# Patient Record
Sex: Male | Born: 1935 | Race: White | Hispanic: No | Marital: Married | Smoking: Never smoker
Health system: Southern US, Community
[De-identification: ages and names within clinical notes are randomized; demographics above are authoritative.]

## PROBLEM LIST (undated history)

## (undated) DIAGNOSIS — M1990 Unspecified osteoarthritis, unspecified site: Secondary | ICD-10-CM

## (undated) DIAGNOSIS — I10 Essential (primary) hypertension: Secondary | ICD-10-CM

## (undated) DIAGNOSIS — Z860100 Personal history of colon polyps, unspecified: Secondary | ICD-10-CM

## (undated) DIAGNOSIS — H3530 Unspecified macular degeneration: Secondary | ICD-10-CM

## (undated) DIAGNOSIS — N529 Male erectile dysfunction, unspecified: Secondary | ICD-10-CM

## (undated) DIAGNOSIS — IMO0002 Reserved for concepts with insufficient information to code with codable children: Secondary | ICD-10-CM

## (undated) DIAGNOSIS — F1010 Alcohol abuse, uncomplicated: Secondary | ICD-10-CM

## (undated) DIAGNOSIS — L111 Transient acantholytic dermatosis [Grover]: Secondary | ICD-10-CM

## (undated) DIAGNOSIS — Z8719 Personal history of other diseases of the digestive system: Secondary | ICD-10-CM

## (undated) HISTORY — DX: Reserved for concepts with insufficient information to code with codable children: IMO0002

## (undated) HISTORY — DX: Personal history of other diseases of the digestive system: Z87.19

## (undated) HISTORY — DX: Unspecified macular degeneration: H35.30

## (undated) HISTORY — DX: Essential (primary) hypertension: I10

## (undated) HISTORY — DX: Alcohol abuse, uncomplicated: F10.10

## (undated) HISTORY — DX: Personal history of colonic polyps: Z86.010

## (undated) HISTORY — DX: Male erectile dysfunction, unspecified: N52.9

## (undated) HISTORY — DX: Unspecified osteoarthritis, unspecified site: M19.90

## (undated) HISTORY — PX: BACK SURGERY: SHX140

## (undated) HISTORY — DX: Personal history of colon polyps, unspecified: Z86.0100

---

## 1969-04-17 HISTORY — PX: SPINE SURGERY: SHX786

## 1985-03-15 ENCOUNTER — Encounter: Payer: Self-pay | Admitting: Family Medicine

## 1997-09-15 ENCOUNTER — Emergency Department (HOSPITAL_COMMUNITY): Admission: EM | Admit: 1997-09-15 | Discharge: 1997-09-15 | Payer: Self-pay | Admitting: Emergency Medicine

## 1997-09-21 ENCOUNTER — Emergency Department (HOSPITAL_COMMUNITY): Admission: EM | Admit: 1997-09-21 | Discharge: 1997-09-21 | Payer: Self-pay | Admitting: Emergency Medicine

## 2005-02-15 ENCOUNTER — Encounter: Payer: Self-pay | Admitting: Family Medicine

## 2008-02-13 ENCOUNTER — Ambulatory Visit: Payer: Self-pay | Admitting: Internal Medicine

## 2008-02-25 ENCOUNTER — Encounter: Payer: Self-pay | Admitting: Family Medicine

## 2008-02-27 ENCOUNTER — Ambulatory Visit: Payer: Self-pay | Admitting: Internal Medicine

## 2008-05-14 ENCOUNTER — Encounter: Payer: Self-pay | Admitting: Family Medicine

## 2008-05-28 ENCOUNTER — Encounter: Payer: Self-pay | Admitting: Family Medicine

## 2008-07-02 ENCOUNTER — Emergency Department (HOSPITAL_COMMUNITY): Admission: EM | Admit: 2008-07-02 | Discharge: 2008-07-02 | Payer: Self-pay | Admitting: Emergency Medicine

## 2008-10-27 ENCOUNTER — Ambulatory Visit: Payer: Self-pay | Admitting: Family Medicine

## 2008-10-27 DIAGNOSIS — Z860100 Personal history of colon polyps, unspecified: Secondary | ICD-10-CM | POA: Insufficient documentation

## 2008-10-27 DIAGNOSIS — I10 Essential (primary) hypertension: Secondary | ICD-10-CM | POA: Insufficient documentation

## 2008-10-27 DIAGNOSIS — M810 Age-related osteoporosis without current pathological fracture: Secondary | ICD-10-CM | POA: Insufficient documentation

## 2008-10-27 DIAGNOSIS — M159 Polyosteoarthritis, unspecified: Secondary | ICD-10-CM | POA: Insufficient documentation

## 2008-10-27 DIAGNOSIS — Z8719 Personal history of other diseases of the digestive system: Secondary | ICD-10-CM | POA: Insufficient documentation

## 2008-10-30 LAB — CONVERTED CEMR LAB
ALT: 17 units/L (ref 0–53)
Albumin: 4.1 g/dL (ref 3.5–5.2)
Basophils Absolute: 0 10*3/uL (ref 0.0–0.1)
Bilirubin, Direct: 0.2 mg/dL (ref 0.0–0.3)
Cholesterol: 198 mg/dL (ref 0–200)
Creatinine, Ser: 1 mg/dL (ref 0.4–1.5)
HCT: 40.9 % (ref 39.0–52.0)
HDL: 112.4 mg/dL (ref >39.00)
LDL Cholesterol: 75 mg/dL (ref 0–99)
Lymphs Abs: 1.1 10*3/uL (ref 0.7–4.0)
MCHC: 35.4 g/dL (ref 30.0–36.0)
MCV: 100.9 fL — ABNORMAL HIGH (ref 78.0–100.0)
Monocytes Relative: 9.9 % (ref 3.0–12.0)
Neutrophils Relative %: 67.1 % (ref 43.0–77.0)
Potassium: 4.9 meq/L (ref 3.5–5.1)
TSH: 1.07 microintl units/mL (ref 0.35–5.50)
Total Bilirubin: 1 mg/dL (ref 0.3–1.2)
Total CHOL/HDL Ratio: 2
Triglycerides: 54 mg/dL (ref 0.0–149.0)
VLDL: 10.8 mg/dL (ref 0.0–40.0)

## 2008-11-13 ENCOUNTER — Ambulatory Visit: Payer: Self-pay | Admitting: Family Medicine

## 2008-11-13 DIAGNOSIS — T887XXA Unspecified adverse effect of drug or medicament, initial encounter: Secondary | ICD-10-CM

## 2008-11-17 LAB — CONVERTED CEMR LAB
Calcium: 9.5 mg/dL (ref 8.4–10.5)
Chloride: 98 meq/L (ref 96–112)
Creatinine, Ser: 1 mg/dL (ref 0.4–1.5)
Sodium: 133 meq/L — ABNORMAL LOW (ref 135–145)

## 2008-12-14 ENCOUNTER — Ambulatory Visit: Payer: Self-pay | Admitting: Family Medicine

## 2009-02-16 ENCOUNTER — Ambulatory Visit: Payer: Self-pay | Admitting: Family Medicine

## 2009-02-16 DIAGNOSIS — L570 Actinic keratosis: Secondary | ICD-10-CM

## 2009-02-16 DIAGNOSIS — F528 Other sexual dysfunction not due to a substance or known physiological condition: Secondary | ICD-10-CM | POA: Insufficient documentation

## 2009-04-13 ENCOUNTER — Ambulatory Visit: Payer: Self-pay | Admitting: Family Medicine

## 2009-06-21 ENCOUNTER — Ambulatory Visit: Payer: Self-pay | Admitting: Family Medicine

## 2009-06-22 LAB — CONVERTED CEMR LAB
ALT: 15 units/L (ref 0–53)
AST: 20 units/L (ref 0–37)
Albumin: 4.1 g/dL (ref 3.5–5.2)
BUN: 8 mg/dL (ref 6–23)
Calcium: 9 mg/dL (ref 8.4–10.5)
Creatinine, Ser: 1 mg/dL (ref 0.4–1.5)
GFR calc non Af Amer: 77.53 mL/min (ref >60)
Phosphorus: 3.4 mg/dL (ref 2.3–4.6)
Potassium: 4.2 meq/L (ref 3.5–5.1)

## 2009-06-24 ENCOUNTER — Ambulatory Visit: Payer: Self-pay | Admitting: Family Medicine

## 2009-06-24 ENCOUNTER — Encounter: Admission: RE | Admit: 2009-06-24 | Discharge: 2009-06-24 | Payer: Self-pay | Admitting: Family Medicine

## 2009-06-24 DIAGNOSIS — R222 Localized swelling, mass and lump, trunk: Secondary | ICD-10-CM | POA: Insufficient documentation

## 2009-06-25 ENCOUNTER — Telehealth: Payer: Self-pay | Admitting: Family Medicine

## 2009-07-05 ENCOUNTER — Ambulatory Visit: Payer: Self-pay | Admitting: Cardiology

## 2009-07-09 ENCOUNTER — Ambulatory Visit: Payer: Self-pay | Admitting: Family Medicine

## 2009-07-21 ENCOUNTER — Encounter: Payer: Self-pay | Admitting: Family Medicine

## 2009-08-18 ENCOUNTER — Encounter: Payer: Self-pay | Admitting: Family Medicine

## 2009-09-09 ENCOUNTER — Ambulatory Visit: Payer: Self-pay | Admitting: Family Medicine

## 2009-12-08 ENCOUNTER — Ambulatory Visit: Payer: Self-pay | Admitting: Family Medicine

## 2009-12-08 DIAGNOSIS — H6120 Impacted cerumen, unspecified ear: Secondary | ICD-10-CM | POA: Insufficient documentation

## 2010-01-05 ENCOUNTER — Ambulatory Visit: Payer: Self-pay | Admitting: Family Medicine

## 2010-02-25 ENCOUNTER — Ambulatory Visit: Payer: Self-pay | Admitting: Family Medicine

## 2010-02-27 LAB — CONVERTED CEMR LAB
ALT: 23 units/L (ref 0–53)
AST: 26 units/L (ref 0–37)
Albumin: 4.2 g/dL (ref 3.5–5.2)
Alkaline Phosphatase: 49 units/L (ref 39–117)
Basophils Absolute: 0 10*3/uL (ref 0.0–0.1)
Basophils Relative: 0.9 % (ref 0.0–3.0)
CO2: 29 meq/L (ref 19–32)
Calcium: 9.3 mg/dL (ref 8.4–10.5)
Eosinophils Relative: 2.3 % (ref 0.0–5.0)
HCT: 40.8 % (ref 39.0–52.0)
Lymphs Abs: 1.7 10*3/uL (ref 0.7–4.0)
Monocytes Relative: 10.9 % (ref 3.0–12.0)
Platelets: 247 10*3/uL (ref 150.0–400.0)
Potassium: 4.4 meq/L (ref 3.5–5.1)
RBC: 3.94 M/uL — ABNORMAL LOW (ref 4.22–5.81)
RDW: 12.7 % (ref 11.5–14.6)
TSH: 1.51 microintl units/mL (ref 0.35–5.50)
Total Protein: 7.1 g/dL (ref 6.0–8.3)
Triglycerides: 47 mg/dL (ref 0.0–149.0)

## 2010-03-23 ENCOUNTER — Ambulatory Visit: Payer: Self-pay | Admitting: Family Medicine

## 2010-03-23 DIAGNOSIS — R7309 Other abnormal glucose: Secondary | ICD-10-CM

## 2010-03-25 ENCOUNTER — Encounter: Payer: Self-pay | Admitting: Family Medicine

## 2010-03-28 ENCOUNTER — Encounter: Payer: Self-pay | Admitting: Family Medicine

## 2010-03-29 ENCOUNTER — Encounter: Payer: Self-pay | Admitting: Family Medicine

## 2010-04-12 ENCOUNTER — Encounter (INDEPENDENT_AMBULATORY_CARE_PROVIDER_SITE_OTHER): Payer: Self-pay | Admitting: *Deleted

## 2010-05-19 NOTE — Assessment & Plan Note (Signed)
Summary: ROA FOR FOLLOW-UP/JRR r/s from 11/22   Vital Signs:  Patient profile:   77 year old male Height:      67 inches Weight:      160.75 pounds BMI:     25.27 Temp:     98.1 degrees F oral Pulse rate:   84 / minute Pulse rhythm:   regular BP sitting:   124 / 62  (left arm) Cuff size:   regular  Vitals Entered By: Lewanda Rife LPN (March 23, 2010 12:45 PM) CC: follow-up visit, Abdominal Pain   History of Present Illness: here for f/u of HTN and chol and alcoholism  had a good holiday  nothing new health wise  feels good    lipids are stable with Last Lipid ProfileCholesterol: 227 (02/25/2010 8:32:59 AM)HDL:  132.50 (02/25/2010 8:32:59 AM)LDL:  75 (10/27/2008 12:05:31 PM)Triglycerides:  Last Liver profileSGOT:  26 (02/25/2010 8:32:59 AM)SPGT:  23 (02/25/2010 8:32:59 AM)T. Bili:  0.7 (02/25/2010 8:32:59 AM)Alk Phos:  49 (02/25/2010 8:32:59 AM)  does eat a fairly healthy diet  eats high fiber cereal -- with bananas  eats veg - collard greens   not much exercise -- is busy and on his feet -- walks to the mailbox  would consider 30 min walk per day     HTN in good control with current meds 124/62 today  wt is up 4 lb  sugar slt high at 111 does not have a sweet tooth as a rule -- occ oatmeal cookies 2-3 per week   no change with alcohol at all  thinks about it once in a while -- but not ready to quit yet  he drinks to soothe his nerves ?      Allergies: 1)  ! Lisinopril 2)  ! * Chorthalidone ?  Past History:  Past Medical History: Last updated: 07/09/2009 Diverticulitis, hx of Hypertension OA  Colonic polyps, hx of DDD OP- with spinal compression fx  macular degeneration/ dry alcohol abuse  at least 8-10 drinks per day ED  GI - [facility]  opthy- Dr Dione Booze   Past Surgical History: Last updated: 07/09/2009 1971 DDD surgeries times 2 colonoscopy 11/09 - tics/polyp mass overlying R med clavicle - neg CT  thoracic comp fx  Family History: Last  updated: 10/27/2008 Family History Hypertension (parent) son with diabetes  no cancer in family   Social History: Last updated: 10/27/2008 Retired Married Never Smoked Alcohol use-yes- drinks beer 7-8 per day Drug use-no Regular exercise-no  Risk Factors: Exercise: no (10/27/2008)  Risk Factors: Smoking Status: never (10/27/2008)  Review of Systems General:  Complains of fatigue; denies chills, fever, loss of appetite, and malaise. Eyes:  Denies blurring and eye irritation. CV:  Denies chest pain or discomfort, lightheadness, palpitations, shortness of breath with exertion, and swelling of feet. Resp:  Denies cough, shortness of breath, and wheezing. GI:  Denies abdominal pain, change in bowel habits, and nausea. GU:  Denies dysuria and urinary frequency. MS:  Denies muscle aches, cramps, and muscle weakness. Derm:  Denies itching, lesion(s), poor wound healing, and rash. Neuro:  Denies numbness and tingling. Psych:  Denies anxiety and depression. Endo:  Denies excessive thirst and excessive urination. Heme:  Denies abnormal bruising and bleeding.  Physical Exam  General:  Well-developed,well-nourished,in no acute distress; alert,appropriate and cooperative throughout examination Head:  normocephalic, atraumatic, and no abnormalities observed.   Eyes:  vision grossly intact, pupils equal, pupils round, and pupils reactive to light.  no conjunctival pallor, injection or icterus  Mouth:  pharynx pink and moist.   Neck:  supple with full rom and no masses or thyromegally, no JVD or carotid bruit  Chest Wall:  No deformities, masses, tenderness or gynecomastia noted. Lungs:  Normal respiratory effort, chest expands symmetrically. Lungs are clear to auscultation, no crackles or wheezes. Heart:  Normal rate and regular rhythm. S1 and S2 normal without gallop, murmur, click, rub or other extra sounds. Abdomen:  Bowel sounds positive,abdomen soft and non-tender without masses,  organomegaly or hernias noted. no renal bruits  Msk:  No deformity or scoliosis noted of thoracic or lumbar spine.  no acute joint changes  Pulses:  R and L carotid,radial,femoral,dorsalis pedis and posterior tibial pulses are full and equal bilaterally Extremities:  No clubbing, cyanosis, edema, or deformity noted with normal full range of motion of all joints.   Neurologic:  sensation intact to light touch, gait normal, and DTRs symmetrical and normal.   fine hand tremor noted  Skin:  Intact without suspicious lesions or rashes no pallor or jaundice  Cervical Nodes:  No lymphadenopathy noted Inguinal Nodes:  No significant adenopathy Psych:  is somewhat anxious today   Impression & Recommendations:  Problem # 1:  ALCOHOL ABUSE (ICD-305.00) Assessment Unchanged disc this and dangers to health pt still has little to no interest in quitting   Problem # 2:  HYPERTENSION (ICD-401.9) Assessment: Unchanged  good control  is mt stable sodium levels  no change in meds rev labs with pt  plan labs and check up in 6 months His updated medication list for this problem includes:    Doxazosin Mesylate 8 Mg Tabs (Doxazosin mesylate) ..... One tab by mouth daily    Spironolactone 25 Mg Tabs (Spironolactone) .Marland Kitchen... 1 by mouth once daily    Norvasc 10 Mg Tabs (Amlodipine besylate) .Marland Kitchen... 1 by mouth once daily  BP today: 124/62 Prior BP: 128/64 (01/05/2010)  Labs Reviewed: K+: 4.4 (02/25/2010) Creat: : 0.9 (02/25/2010)   Chol: 227 (02/25/2010)   HDL: 132.50 (02/25/2010)   LDL: 75 (10/27/2008)   TG: 47.0 (02/25/2010)  Problem # 3:  HYPERGLYCEMIA (ICD-790.29) Assessment: New mild and in need of monitoring disc imp of exercise and low sugar diet  disc role of alcohol in that  overall nutrition is fair  disc need for more veg disc need for exercise in detail  Complete Medication List: 1)  Doxazosin Mesylate 8 Mg Tabs (Doxazosin mesylate) .... One tab by mouth daily 2)  Alendronate Sodium  70 Mg Tabs (Alendronate sodium) .... One tab by mouth each week 3)  Spironolactone 25 Mg Tabs (Spironolactone) .Marland Kitchen.. 1 by mouth once daily 4)  Aspirin 81 Mg Tabs (Aspirin) .... Take 1 tablet by mouth once a day 5)  Calcium Carbonate-vitamin D 600-400 Mg-unit Tabs (Calcium carbonate-vitamin d) .... 2 tabs by mouth daily 6)  Preservision/lutein Caps (Multiple vitamins-minerals) .... Take 1 tablet by mouth once a day 7)  Centrum Silver Tabs (Multiple vitamins-minerals) .... Take 1 tablet by mouth once a day 8)  Norvasc 10 Mg Tabs (Amlodipine besylate) .Marland Kitchen.. 1 by mouth once daily   Patient Instructions: 1)  keep thinking about quitting alcohol- this would b best for your health 2)  labs ok  3)  blood pressure ok  4)  no change in medicines  5)  schedule f/u for 30 minute check up in 6 months (labs first incl renal/ hepatic/ tsh/ psa / vit D level   for 401.1, prostate screen and AIC for hyperglycemia  Orders Added: 1)  Est. Patient Level IV [16109]     Current Allergies (reviewed today): ! LISINOPRIL ! * CHORTHALIDONE ?

## 2010-05-19 NOTE — Consult Note (Signed)
Summary: [facility]   Imported By: Lanelle Bal 09/15/2009 10:31:38  _____________________________________________________________________  External Attachment:    Type:   Image     Comment:   External Document

## 2010-05-19 NOTE — Progress Notes (Signed)
Summary: CT with contrast  Phone Note Outgoing Call   Call placed by: Lewanda Rife Call placed to: Dr. Lyman Bishop radiologist at [facility] Summary of Call: Dr. Kearney Hard was not in today but Dr. Lyman Bishop radiologist at [facility] said should be with contrast as long as renal function is OK.  Initial call taken by: Lewanda Rife LPN,  June 25, 2009 4:56 PM  Follow-up for Phone Call        thank you please let pt know that they saw the lump we examined on his chest x ray and rib films-- it appears  to be soft tissue the radiologist recommends Ct of chest so we can try to figure out what it is  I am going to do the ref for that and route to [facility] I will update him further when I get a result  Follow-up by: Judith Part MD,  June 25, 2009 5:13 PM  Additional Follow-up for Phone Call Additional follow up Details #1::        Patient's wife  notified as instructed by telephone. She will wait to hear from [place] next week. She said pt is doing fine.Lewanda Rife LPN  June 25, 2009 5:27 PM     Additional Follow-up for Phone Call Additional follow up Details #2::    CT set up for 07/05/2009 at [facility] . Follow-up by: Carlton Adam,  July 02, 2009 3:08 PM

## 2010-05-19 NOTE — Assessment & Plan Note (Signed)
Summary: 2 month fu /mk   Vital Signs:  Patient profile:   77 year old male Height:      67 inches Weight:      154.75 pounds BMI:     24.32 Temp:     97.9 degrees F oral Pulse rate:   88 / minute Pulse rhythm:   regular BP sitting:   138 / 68  (left arm) Cuff size:   regular  Vitals Entered By: Lewanda Rife LPN (Sep 09, 2009 9:42 AM)  Serial Vital Signs/Assessments:  Time      Position  BP       Pulse  Resp  Temp     By                     132/68                         Judith Part MD  CC: 2 month f/u BP, Headache   History of Present Illness: here for f/u of HTN  last visit inc his norvasc to 10 mg from 5  feels good overall    today first check 138/68 bp at home usually 130s 60s-70s  overall much better  no trouble with inc norvasc   alcohol-- has not cut down -- tries to spread them out more  he is not convinced yet that he needs to cut down or quit  does not know why he drinks so much   has naproxen from orthopedic -- two times a day for general joint aches andpains / knees was given 1 mo supply  does not take regularly  disc the lump on clavicle and reviewed studies not really concerned - but will see him back for f/u in about 3 mo   he continues to take fosamax- does not bother his stomach  he is so/ so about ca and vit D  Headache HPI:      Positive alarm features include mylagia.         Allergies: 1)  ! Lisinopril 2)  ! * Chorthalidone ?  Past History:  Past Medical History: Last updated: 07/09/2009 Diverticulitis, hx of Hypertension OA  Colonic polyps, hx of DDD OP- with spinal compression fx  macular degeneration/ dry alcohol abuse  at least 8-10 drinks per day ED  GI - [facility]  opthy- Dr Dione Booze   Past Surgical History: Last updated: 07/09/2009 1971 DDD surgeries times 2 colonoscopy 11/09 - tics/polyp mass overlying R med clavicle - neg CT  thoracic comp fx  Family History: Last updated: 10/27/2008 Family History  Hypertension (parent) son with diabetes  no cancer in family   Social History: Last updated: 10/27/2008 Retired Married Never Smoked Alcohol use-yes- drinks beer 7-8 per day Drug use-no Regular exercise-no  Risk Factors: Exercise: no (10/27/2008)  Risk Factors: Smoking Status: never (10/27/2008)  Review of Systems General:  Denies fatigue, fever, loss of appetite, and malaise. Eyes:  Denies blurring and eye irritation. CV:  Denies chest pain or discomfort, palpitations, shortness of breath with exertion, and swelling of feet. Resp:  Denies cough, shortness of breath, and wheezing. GI:  Denies abdominal pain, change in bowel habits, and indigestion. GU:  Denies urinary frequency. MS:  Complains of joint pain; denies joint redness, joint swelling, and cramps. Derm:  Denies itching, lesion(s), poor wound healing, and rash. Neuro:  Denies numbness and tingling. Psych:  mood is ok . Endo:  Denies cold intolerance,  excessive thirst, excessive urination, and heat intolerance. Heme:  Denies abnormal bruising and bleeding.  Physical Exam  General:  Well-developed,well-nourished,in no acute distress; alert,appropriate and cooperative throughout examination Head:  normocephalic, atraumatic, and no abnormalities observed.   Eyes:  vision grossly intact, pupils equal, pupils round, pupils reactive to light, and no injection.  no icterus  Mouth:  pharynx pink and moist.   Neck:  supple with full rom and no masses or thyromegally, no JVD or carotid bruit  Chest Wall:  mass noted at medial end of R clavicle  is soft and nonmobile and nt  Lungs:  Normal respiratory effort, chest expands symmetrically. Lungs are clear to auscultation, no crackles or wheezes. Heart:  Normal rate and regular rhythm. S1 and S2 normal without gallop, murmur, click, rub or other extra sounds. Abdomen:  no renal bruits soft, non-tender, no hepatomegaly, and no splenomegaly.   Msk:  No deformity or scoliosis  noted of thoracic or lumbar spine.  poor rom spine Extremities:  No clubbing, cyanosis, edema, or deformity noted with normal full range of motion of all joints.   Neurologic:  sensation intact to light touch, gait normal, and DTRs symmetrical and normal.   Skin:  Intact without suspicious lesions or rashes tanned with sks  Cervical Nodes:  No lymphadenopathy noted Inguinal Nodes:  No significant adenopathy Psych:  nl affect    Impression & Recommendations:  Problem # 1:  SWELLING, MASS, OR LUMP IN CHEST (ICD-786.6) Assessment Comment Only  re- assuring CT and watched by ortho - no change  Orders: Prescription Created Electronically [phone])  Problem # 2:  OSTEOPOROSIS (ICD-733.00) Assessment: Unchanged  renewed fosamax adv to quit alcohol rev ca and D  His updated medication list for this problem includes:    Alendronate Sodium 70 Mg Tabs (Alendronate sodium) ..... One tab by mouth each week    Calcium Carbonate-vitamin D 600-400 Mg-unit Tabs (Calcium carbonate-vitamin d) .Marland Kitchen... 2 tabs by mouth daily  Orders: Prescription Created Electronically [phone])  Problem # 3:  HYPERTENSION (ICD-401.9) Assessment: Improved  this is imp with inc norvasc dose- here and at home disc limiting alcohol and also sodium  asked to minimize nsaids  f/u in 6 mo after labs  His updated medication list for this problem includes:    Doxazosin Mesylate 8 Mg Tabs (Doxazosin mesylate) ..... One tab by mouth daily    Spironolactone 25 Mg Tabs (Spironolactone) .Marland Kitchen... 1 by mouth once daily    Norvasc 10 Mg Tabs (Amlodipine besylate) .Marland Kitchen... 1 by mouth once daily  BP today: 138/68 Prior BP: 140/72 (07/09/2009)  Labs Reviewed: K+: 4.2 (06/21/2009) Creat: : 1.0 (06/21/2009)   Chol: 198 (10/27/2008)   HDL: 112.40 (10/27/2008)   LDL: 75 (10/27/2008)   TG: 54.0 (10/27/2008)  Orders: Prescription Created Electronically [phone])  Problem # 4:  ALCOHOL ABUSE (ICD-305.00) Assessment: Unchanged  pt aware  of risks- still not interested in changing habits will continue to try to convince him  Orders: Prescription Created Electronically [phone])  Complete Medication List: 1)  Doxazosin Mesylate 8 Mg Tabs (Doxazosin mesylate) .... One tab by mouth daily 2)  Alendronate Sodium 70 Mg Tabs (Alendronate sodium) .... One tab by mouth each week 3)  Spironolactone 25 Mg Tabs (Spironolactone) .Marland Kitchen.. 1 by mouth once daily 4)  Aspirin 81 Mg Tabs (Aspirin) .... Take 1 tablet by mouth once a day 5)  Calcium Carbonate-vitamin D 600-400 Mg-unit Tabs (Calcium carbonate-vitamin d) .... 2 tabs by mouth daily 6)  Preservision/lutein Caps (  Multiple vitamins-minerals) .... Take 1 tablet by mouth once a day 7)  Centrum Silver Tabs (Multiple vitamins-minerals) .... Take 1 tablet by mouth once a day 8)  Norvasc 10 Mg Tabs (Amlodipine besylate) .Marland Kitchen.. 1 by mouth once daily 9)  Naprosyn 500 Mg Tabs (Naproxen) .... Take one tab twice a day with food 10)  Methocarbamol 500 Mg Tabs (Methocarbamol) .... Take one tab twice a day  Patient Instructions: 1)  take the naproxen only when you really need it -- it raises blood pressure and can cause GI bleeding and also is not good to take with alcohol  2)  talk to your orthopedic doctor about that  3)  no other medicine changes  4)  schedule fasting labs and then follow up in 6 months lipid/hepatic/ renal / tsh / cbc with diff 401.1 5)  work on cutting alcohol intake  Prescriptions: ALENDRONATE SODIUM 70 MG TABS (ALENDRONATE SODIUM) one tab by mouth each week  #12 x 11   Entered and Authorized by:   Judith Part MD   Signed by:   Judith Part MD on 09/09/2009   Method used:   Electronically to        [facility] (retail)       [address]       [place], [place]  [id number]       Ph: [phone] or [phone]       Fax: [phone]   RxID:   [id number] SPIRONOLACTONE 25 MG TABS (SPIRONOLACTONE) 1 by mouth once daily  #30 x 11   Entered and Authorized  by:   Judith Part MD   Signed by:   Judith Part MD on 09/09/2009   Method used:   Electronically to        [facility] (retail)       [address]       [place], [place]  [id number]       Ph: [phone] or [phone]       Fax: [phone]   RxID:   [id number]   Current Allergies (reviewed today): ! LISINOPRIL ! * CHORTHALIDONE ?

## 2010-05-19 NOTE — Assessment & Plan Note (Signed)
Summary: ROA FOR 2-3 WEEK FOLLOW-UP/JRR   Vital Signs:  Patient profile:   77 year old male Weight:      156.6 pounds Temp:     97.8 degrees F oral Pulse rate:   68 / minute Pulse rhythm:   regular BP sitting:   128 / 64  (left arm) Cuff size:   regular  Vitals Entered By: Sydell Axon LPN (January 05, 2010 8:59 AM) CC: follow-up visit on ears   History of Present Illness: here for ear irrigation - after using debrox using at home twice weekly- nothing coming out so far  hearing is a problem no pain or fever   Allergies: 1)  ! Lisinopril 2)  ! * Chorthalidone ?  Past History:  Past Medical History: Last updated: 07/09/2009 Diverticulitis, hx of Hypertension OA  Colonic polyps, hx of DDD OP- with spinal compression fx  macular degeneration/ dry alcohol abuse  at least 8-10 drinks per day ED  GI - [place] [facility]  opthy- Dr Dione Booze   Past Surgical History: Last updated: 07/09/2009 1971 DDD surgeries times 2 colonoscopy 11/09 - tics/polyp mass overlying R med clavicle - neg CT  thoracic comp fx  Family History: Last updated: 10/27/2008 Family History Hypertension (parent) son with diabetes  no cancer in family   Social History: Last updated: 10/27/2008 Retired Married Never Smoked Alcohol use-yes- drinks beer 7-8 per day Drug use-no Regular exercise-no  Risk Factors: Exercise: no (10/27/2008)  Risk Factors: Smoking Status: never (10/27/2008)  Review of Systems General:  Denies chills, fatigue, fever, loss of appetite, and malaise. Eyes:  Denies blurring, discharge, and eye irritation. ENT:  Complains of decreased hearing; denies ear discharge and earache. CV:  Denies chest pain or discomfort. Resp:  Denies cough and shortness of breath. GI:  Denies nausea and vomiting. Derm:  Denies itching and rash. Neuro:  Denies sensation of room spinning, tingling, and weakness. Heme:  Denies abnormal bruising and bleeding.  Physical Exam  Ears:  cerument  impaction bilat cleared totally by simple irrigation today TMs appear nl  hearing improved    Impression & Recommendations:  Problem # 1:  CERUMEN IMPACTION, BILATERAL (ICD-380.4) Assessment Improved resolved after debrox use and simple irrigation  adv to use debrox 2 times per mo f/u if problems or re- occurance  Problem # 2:  Preventive Health Care (ICD-V70.0) Assessment: Comment Only flu shot today  Complete Medication List: 1)  Doxazosin Mesylate 8 Mg Tabs (Doxazosin mesylate) .... One tab by mouth daily 2)  Alendronate Sodium 70 Mg Tabs (Alendronate sodium) .... One tab by mouth each week 3)  Spironolactone 25 Mg Tabs (Spironolactone) .Marland Kitchen.. 1 by mouth once daily 4)  Aspirin 81 Mg Tabs (Aspirin) .... Take 1 tablet by mouth once a day 5)  Calcium Carbonate-vitamin D 600-400 Mg-unit Tabs (Calcium carbonate-vitamin d) .... 2 tabs by mouth daily 6)  Preservision/lutein Caps (Multiple vitamins-minerals) .... Take 1 tablet by mouth once a day 7)  Centrum Silver Tabs (Multiple vitamins-minerals) .... Take 1 tablet by mouth once a day 8)  Norvasc 10 Mg Tabs (Amlodipine besylate) .Marland Kitchen.. 1 by mouth once daily  Other Orders: Flu Vaccine 74yrs + MEDICARE PATIENTS (Z6109) Administration Flu vaccine - MCR (U0454)  Patient Instructions: 1)  ears are clear now -- use debrox twice monthly to keep them clear 2)  if pain or other symptoms - let me know  3)  got your flu shot today  Current Allergies (reviewed today): ! LISINOPRIL ! * CHORTHALIDONE ?  Flu Vaccine Consent Questions     Do you have a history of severe allergic reactions to this vaccine? no    Any prior history of allergic reactions to egg and/or gelatin? no    Do you have a sensitivity to the preservative Thimersol? no    Do you have a past history of Guillan-Barre Syndrome? no    Do you currently have an acute febrile illness? no    Have you ever had a severe reaction to latex? no    Vaccine information given and explained  to patient? yes    Are you currently pregnant? no    Lot Number:[id number]   Exp Date:10/15/2010   Site Given  Left Deltoid IMflu

## 2010-05-19 NOTE — Assessment & Plan Note (Signed)
Summary: 2 WEEK FOLLOW UP/RBH   Vital Signs:  Patient profile:   77 year old male Height:      67 inches Weight:      156 pounds BMI:     24.52 Temp:     97.5 degrees F oral Pulse rate:   80 / minute Pulse rhythm:   regular BP sitting:   140 / 72  (left arm) Cuff size:   regular  Vitals Entered By: Linde Gillis CMA Duncan Dull) (July 09, 2009 10:55 AM) CC: 2-4 week follow up   History of Present Illness: here for f/u of HTN and mass on chest   bp is imp today 140/72 reported yesterday- though that bp was high at 155/101  added norvasc at last visit for bp control at home higher in am before med- and lower later in the day  afternoons usually 140s/ 70s -- 150- 160s in the ams   avoids salt in general  still drinking heavily- is not ready to change that   cxr and rib films showed opacity seen on exam  nl CT scan  is re assuring  mass is still there -- not getting bigger I did recommend surgical ref   did have a compression fracture -- known in the past  that is why he is on fosamx  Allergies: 1)  ! Lisinopril 2)  ! * Chorthalidone ?  Past History:  Family History: Last updated: 10/27/2008 Family History Hypertension (parent) son with diabetes  no cancer in family   Social History: Last updated: 10/27/2008 Retired Married Never Smoked Alcohol use-yes- drinks beer 7-8 per day Drug use-no Regular exercise-no  Risk Factors: Exercise: no (10/27/2008)  Risk Factors: Smoking Status: never (10/27/2008)  Past Medical History: Diverticulitis, hx of Hypertension OA  Colonic polyps, hx of DDD OP- with spinal compression fx  macular degeneration/ dry alcohol abuse  at least 8-10 drinks per day ED  GI - [place]  opthy- Dr Dione Booze   Past Surgical History: 1971 DDD surgeries times 2 colonoscopy 11/09 - tics/polyp mass overlying R med clavicle - neg CT  thoracic comp fx  Review of Systems General:  Denies fatigue, loss of appetite, and malaise. Eyes:   Denies blurring and eye pain. CV:  Denies chest pain or discomfort, palpitations, shortness of breath with exertion, and swelling of feet. Resp:  Denies cough and wheezing. GI:  Denies abdominal pain, bloody stools, change in bowel habits, and indigestion. MS:  Denies joint pain, joint redness, and joint swelling. Derm:  Denies lesion(s), poor wound healing, and rash. Neuro:  Denies falling down, headaches, and sensation of room spinning. Psych:  mood is generally ok . Endo:  Denies cold intolerance, excessive thirst, excessive urination, and heat intolerance. Heme:  Denies abnormal bruising and bleeding.  Physical Exam  General:  Well-developed,well-nourished,in no acute distress; alert,appropriate and cooperative throughout examination Head:  normocephalic, atraumatic, and no abnormalities observed.   Eyes:  vision grossly intact, pupils equal, pupils round, pupils reactive to light, and no injection.  no icterus  Mouth:  pharynx pink and moist.   Neck:  supple with full rom and no masses or thyromegally, no JVD or carotid bruit  Chest Wall:  mass noted at medial end of R clavicle  is soft and nonmobile and nt  Lungs:  Normal respiratory effort, chest expands symmetrically. Lungs are clear to auscultation, no crackles or wheezes. Heart:  Normal rate and regular rhythm. S1 and S2 normal without gallop, murmur, click, rub or other  extra sounds. Abdomen:  soft, non-tender, and normal bowel sounds.  no renal bruits  Msk:  soft non mobile mass overlying med clavicle  is nontender with no skin changes  Extremities:  No clubbing, cyanosis, edema, or deformity noted with normal full range of motion of all joints.   Neurologic:  sensation intact to light touch, gait normal, and DTRs symmetrical and normal.  very slt hand tremor on intention Skin:  Intact without suspicious lesions or rashes many SKs and lentigos Cervical Nodes:  No lymphadenopathy noted Axillary Nodes:  No palpable  lymphadenopathy Psych:  nl affect     Impression & Recommendations:  Problem # 1:  SWELLING, MASS, OR LUMP IN CHEST (ICD-786.6) Assessment Unchanged re- assuring CT scan but still unsure what it is surgical referral done no growth or change  rev cxr and rib films and CT today and pt given copies   Problem # 2:  HYPERTENSION (ICD-401.9) Assessment: Improved  improved but not at goal will inc norvasc to 10 mg and f/u 2 mo pt advised to update me if side eff or problems continue to check bp at home  work on alcohol cesation -- pt does not seem motivated  His updated medication list for this problem includes:    Doxazosin Mesylate 8 Mg Tabs (Doxazosin mesylate) ..... One tab by mouth daily    Spironolactone 25 Mg Tabs (Spironolactone) .Marland Kitchen... 1 by mouth once daily    Norvasc 10 Mg Tabs (Amlodipine besylate) .Marland Kitchen... 1 by mouth once daily  Orders: Prescription Created Electronically [phone])  Problem # 3:  ALCOHOL ABUSE (ICD-305.00) Assessment: Unchanged once again disc imp of cessation  disc relationship to alcoholism and comp fx in spine as well disc this in detail- pt does not think this is much of a problem - even with over 10 drinks per day  Problem # 4:  OSTEOPOROSIS (ICD-733.00) Assessment: Unchanged disc relationship to this and comp fx in spine (thankfully no symptoms - suspect old) emph imp of alcohol cessation/ good ca and D intake , exercise and continued fosamax  His updated medication list for this problem includes:    Alendronate Sodium 70 Mg Tabs (Alendronate sodium) ..... One tab by mouth each week    Calcium Carbonate-vitamin D 600-400 Mg-unit Tabs (Calcium carbonate-vitamin d) .Marland Kitchen... 2 tabs by mouth daily  Complete Medication List: 1)  Doxazosin Mesylate 8 Mg Tabs (Doxazosin mesylate) .... One tab by mouth daily 2)  Alendronate Sodium 70 Mg Tabs (Alendronate sodium) .... One tab by mouth each week 3)  Spironolactone 25 Mg Tabs (Spironolactone) .Marland Kitchen.. 1 by mouth  once daily 4)  Aspirin 81 Mg Tabs (Aspirin) .... Take 1 tablet by mouth once a day 5)  Calcium Carbonate-vitamin D 600-400 Mg-unit Tabs (Calcium carbonate-vitamin d) .... 2 tabs by mouth daily 6)  Preservision/lutein Caps (Multiple vitamins-minerals) .... Take 1 tablet by mouth once a day 7)  Centrum Silver Tabs (Multiple vitamins-minerals) .... Take 1 tablet by mouth once a day 8)  Norvasc 10 Mg Tabs (Amlodipine besylate) .Marland Kitchen.. 1 by mouth once daily  Patient Instructions: 1)  double up on the norvasc you have -- take 2 of the five mg pills once daily 2)  when those run out - get px for the 10 mg pills and just take 1 daily (I sent this to your pharmacy)  3)  if any side effects or problems let me know  4)  stop and see Shirlee Limerick on way out to organize the surgical referral  5)  follow up with me in about 2 months  Prescriptions: NORVASC 10 MG TABS (AMLODIPINE BESYLATE) 1 by mouth once daily  #30 x 11   Entered and Authorized by:   Judith Part MD   Signed by:   Judith Part MD on 07/09/2009   Method used:   Electronically to        [facility] (retail)       [address]       [place], [place]  [id number]       Ph: [phone] or [phone]       Fax: [phone]   RxID:   [id number]   Current Allergies (reviewed today): ! LISINOPRIL ! * CHORTHALIDONE ?

## 2010-05-19 NOTE — Assessment & Plan Note (Signed)
Summary: F/U AFTER LABS / LFW   Vital Signs:  Patient profile:   77 year old male Height:      67 inches Weight:      156.75 pounds BMI:     24.64 Temp:     98.1 degrees F oral Pulse rate:   92 / minute Pulse rhythm:   regular BP sitting:   142 / 68  (left arm) Cuff size:   regular  Vitals Entered By: Lewanda Rife LPN (June 24, 2009 10:16 AM)  Serial Vital Signs/Assessments:  Time      Position  BP       Pulse  Resp  Temp     By                     150/70                         Judith Part MD   History of Present Illness: here for f/u of his HTN and to rev labs and for prostate screen    sodium level 133  other labs stable   psa 2.63 (no prior value for comp)  bp 142/68 today---2nd check 150/70 is watching bp at home 140s - 160s systolic over 70- 80s   has a bit of cough ever since he had uri - saw Billie  not production little hoarse throat no smok or tab at all or ever   is cutting down on alcohol  is drinking 8-10 drinks a day -- less than it was  is not ready to quit   lump over chest he wants me to check   Allergies: 1)  ! Lisinopril 2)  ! * Chorthalidone ?  Past History:  Past Surgical History: Last updated: 10/27/2008 1971 DDD surgeries times 2 colonoscopy 11/09 - tics/polyp  Family History: Last updated: 10/27/2008 Family History Hypertension (parent) son with diabetes  no cancer in family   Social History: Last updated: 10/27/2008 Retired Married Never Smoked Alcohol use-yes- drinks beer 7-8 per day Drug use-no Regular exercise-no  Risk Factors: Exercise: no (10/27/2008)  Risk Factors: Smoking Status: never (10/27/2008)  Past Medical History: Diverticulitis, hx of Hypertension OA  Colonic polyps, hx of DDD OP macular degeneration/ dry alcohol abuse  at least 8-10 drinks per day ED  GI - [place]  opthy- Dr Dione Booze   Review of Systems General:  Denies chills, fatigue, fever, loss of appetite, malaise, and  weight loss. Eyes:  Denies blurring and eye irritation. ENT:  Complains of postnasal drainage; denies hoarseness, nasal congestion, sinus pressure, and sore throat. CV:  Denies chest pain or discomfort, lightheadness, palpitations, and shortness of breath with exertion. Resp:  Complains of cough; denies pleuritic, shortness of breath, sputum productive, and wheezing. GI:  Denies indigestion, nausea, and vomiting. GU:  Denies dysuria and hematuria. MS:  Denies joint pain. Derm:  Denies itching, lesion(s), poor wound healing, and rash. Neuro:  Denies numbness, seizures, tingling, and tremors. Psych:  mood is generally good . Endo:  Denies cold intolerance, excessive thirst, excessive urination, and heat intolerance. Heme:  Denies abnormal bruising and bleeding.  Physical Exam  General:  Well-developed,well-nourished,in no acute distress; alert,appropriate and cooperative throughout examination Head:  normocephalic, atraumatic, and no abnormalities observed.   Eyes:  vision grossly intact, pupils equal, pupils round, pupils reactive to light, and no injection.  no icterus  Mouth:  pharynx pink and moist.   Neck:  supple  with full rom and no masses or thyromegally, no JVD or carotid bruit  Chest Wall:  mass noted at medial end of R clavicle  is soft and nonmobile and nt  Breasts:  No masses or gynecomastia noted Lungs:  Normal respiratory effort, chest expands symmetrically. Lungs are clear to auscultation, no crackles or wheezes. Heart:  Normal rate and regular rhythm. S1 and S2 normal without gallop, murmur, click, rub or other extra sounds. Abdomen:  Bowel sounds positive,abdomen soft and non-tender without masses, organomegaly or hernias noted. no renal bruits  Msk:  soft non mobile mass overlying med clavicle  is nontender with no skin changes  Pulses:  R and L carotid,radial,femoral,dorsalis pedis and posterior tibial pulses are full and equal bilaterally Extremities:  No clubbing,  cyanosis, edema, or deformity noted with normal full range of motion of all joints.   Neurologic:  sensation intact to light touch, gait normal, and DTRs symmetrical and normal.  very slt hand tremor on intention Skin:  Intact without suspicious lesions or rashes no jaundice  Cervical Nodes:  No lymphadenopathy noted Inguinal Nodes:  No significant adenopathy Psych:  normal affect, talkative and pleasant    Impression & Recommendations:  Problem # 1:  SWELLING, MASS, OR LUMP IN CHEST (ICD-786.6) Assessment New lump over R med clavicle feels soft - ? lipoma vs other  per pt , no hx of trauma there sent for cxr and rib films - f/u 2 weeks  Orders: Radiology Referral (Radiology)  Problem # 2:  COUGH (ICD-786.2) Assessment: New ongong since uri several mo ago- dry without other symptoms cxr and f/u  update if fever/prod or worse  Orders: Radiology Referral (Radiology)  Problem # 3:  ALCOHOL ABUSE (ICD-305.00) Assessment: Unchanged aat 8-10 drinks per day pt not motivated to quit yet I stressed again dangers to health with this and feel he should seek out tx program like AA pt does not think this adversely aff him socially or aff his marriage   he will think about it   Problem # 4:  HYPERTENSION (ICD-401.9) Assessment: Deteriorated  bp not opt controlled did check his cuff here  add norvasc andwatch closely -- f/u 2 weeks update if side eff- disc this and what to watch for His updated medication list for this problem includes:    Doxazosin Mesylate 8 Mg Tabs (Doxazosin mesylate) ..... One tab by mouth daily    Spironolactone 25 Mg Tabs (Spironolactone) .Marland Kitchen... 1 by mouth once daily    Norvasc 5 Mg Tabs (Amlodipine besylate) .Marland Kitchen... 1 by mouth each am  BP today: 142/68 Prior BP: 158/90 (04/13/2009)  Labs Reviewed: K+: 4.2 (06/21/2009) Creat: : 1.0 (06/21/2009)   Chol: 198 (10/27/2008)   HDL: 112.40 (10/27/2008)   LDL: 75 (10/27/2008)   TG: 54.0  (10/27/2008)  Orders: Prescription Created Electronically [phone])  Problem # 5:  SPECIAL SCREENING MALIGNANT NEOPLASM OF PROSTATE (ICD-V76.44) Assessment: Comment Only psa is in nl range  did not do dre today due to mult other issues will do withnext visit   Complete Medication List: 1)  Doxazosin Mesylate 8 Mg Tabs (Doxazosin mesylate) .... One tab by mouth daily 2)  Alendronate Sodium 70 Mg Tabs (Alendronate sodium) .... One tab by mouth each week 3)  Spironolactone 25 Mg Tabs (Spironolactone) .Marland Kitchen.. 1 by mouth once daily 4)  Aspirin 81 Mg Tabs (Aspirin) .... Take 1 tablet by mouth once a day 5)  Calcium Carbonate-vitamin D 600-400 Mg-unit Tabs (Calcium carbonate-vitamin d) .... 2 tabs  by mouth daily 6)  Preservision/lutein Caps (Multiple vitamins-minerals) .... Take 1 tablet by mouth once a day 7)  Centrum Silver Tabs (Multiple vitamins-minerals) .... Take 1 tablet by mouth once a day 8)  Norvasc 5 Mg Tabs (Amlodipine besylate) .Marland Kitchen.. 1 by mouth each am  Patient Instructions: 1)  add norvasc / amlodipine 5 mg daily in am 2)  if any side effects or low blood pressures let me know  3)  I sent that to your drugstore 4)  we will set up x rays at check out  5)  follow up with in 2 weeks  6)  no other medicine changes 7)  please work on cutting down and eventually quitting alcohol 8)  think about going to an alcoholics anonymous meeting in the future  Prescriptions: NORVASC 5 MG TABS (AMLODIPINE BESYLATE) 1 by mouth each am  #30 x 11   Entered and Authorized by:   Judith Part MD   Signed by:   Judith Part MD on 06/24/2009   Method used:   Electronically to        [facility] (retail)       [address]       [place], [place]  [id number]       Ph: [phone] or [phone]       Fax: [phone]   RxID:   [id number]   Current Allergies (reviewed today): ! LISINOPRIL ! * CHORTHALIDONE ?

## 2010-05-19 NOTE — Miscellaneous (Signed)
Summary: [facility]   Imported By: Beau Fanny 07/07/2009 10:41:06  _____________________________________________________________________  External Attachment:    Type:   Image     Comment:   External Document

## 2010-05-19 NOTE — Assessment & Plan Note (Signed)
Summary: ear stopped up/RL   Vital Signs:  Patient profile:   77 year old male Height:      67 inches Weight:      155 pounds BMI:     24.36 Temp:     98.1 degrees F oral Pulse rate:   76 / minute Pulse rhythm:   regular BP sitting:   134 / 68  (left arm) Cuff size:   regular  Vitals Entered By: Lewanda Rife LPN (December 08, 2009 12:38 PM) CC: Left ear feels funny ? stopped up   History of Present Illness: L ear does not feel right -- possibly stopped up with wax ?  about a month thought he had water in it -- used "swimmer's ear " product -- did not help much  no headache  sounds like he is in a drum   no allergy symptoms   other ear is fine   no fever  Allergies: 1)  ! Lisinopril 2)  ! * Chorthalidone ?  Past History:  Past Medical History: Last updated: 07/09/2009 Diverticulitis, hx of Hypertension OA  Colonic polyps, hx of DDD OP- with spinal compression fx  macular degeneration/ dry alcohol abuse  at least 8-10 drinks per day ED  GI - [place]  opthy- Dr Dione Booze   Past Surgical History: Last updated: 07/09/2009 1971 DDD surgeries times 2 colonoscopy 11/09 - tics/polyp mass overlying R med clavicle - neg CT  thoracic comp fx  Family History: Last updated: 10/27/2008 Family History Hypertension (parent) son with diabetes  no cancer in family   Social History: Last updated: 10/27/2008 Retired Married Never Smoked Alcohol use-yes- drinks beer 7-8 per day Drug use-no Regular exercise-no  Risk Factors: Exercise: no (10/27/2008)  Risk Factors: Smoking Status: never (10/27/2008)  Review of Systems General:  Denies chills, fatigue, fever, loss of appetite, and malaise. Eyes:  Denies eye irritation and eye pain. ENT:  Complains of decreased hearing; denies ear discharge and earache. CV:  Denies chest pain or discomfort and palpitations. Resp:  Denies cough. GI:  Denies nausea and vomiting. Derm:  Denies itching and rash. Neuro:  Denies poor  balance and sensation of room spinning.  Physical Exam  General:  Well-developed,well-nourished,in no acute distress; alert,appropriate and cooperative throughout examination Head:  normocephalic, atraumatic, and no abnormalities observed.  no sinus or temporal tenderness Eyes:  vision grossly intact, pupils equal, pupils round, and pupils reactive to light.   Ears:  bilat deep dry cerumen impaction some dec hearing to soft sounds Mouth:  pharynx pink and moist.   Neck:  No deformities, masses, or tenderness noted. Lungs:  Normal respiratory effort, chest expands symmetrically. Lungs are clear to auscultation, no crackles or wheezes. Heart:  Normal rate and regular rhythm. S1 and S2 normal without gallop, murmur, click, rub or other extra sounds. Skin:  Intact without suspicious lesions or rashes Cervical Nodes:  No lymphadenopathy noted Psych:  normal affect, talkative and pleasant    Impression & Recommendations:  Problem # 1:  CERUMEN IMPACTION, BILATERAL (ICD-380.4) Assessment New deep and very dry bilat with some hearing loss inst to use debrox as directed twice weekly for 2-3 weeks and then f/u for ear irrigation if pain or fever in meantime- will update  Complete Medication List: 1)  Doxazosin Mesylate 8 Mg Tabs (Doxazosin mesylate) .... One tab by mouth daily 2)  Alendronate Sodium 70 Mg Tabs (Alendronate sodium) .... One tab by mouth each week 3)  Spironolactone 25 Mg Tabs (Spironolactone) .Marland KitchenMarland KitchenMarland Kitchen 1  by mouth once daily 4)  Aspirin 81 Mg Tabs (Aspirin) .... Take 1 tablet by mouth once a day 5)  Calcium Carbonate-vitamin D 600-400 Mg-unit Tabs (Calcium carbonate-vitamin d) .... 2 tabs by mouth daily 6)  Preservision/lutein Caps (Multiple vitamins-minerals) .... Take 1 tablet by mouth once a day 7)  Centrum Silver Tabs (Multiple vitamins-minerals) .... Take 1 tablet by mouth once a day 8)  Norvasc 10 Mg Tabs (Amlodipine besylate) .Marland Kitchen.. 1 by mouth once daily 9)  Naprosyn 500 Mg  Tabs (Naproxen) .... Take one tab twice a day with food as needed. 10)  Methocarbamol 500 Mg Tabs (Methocarbamol) .... Take one tab twice a day  Patient Instructions: 1)  you have a deep ear wax impaction in both ears  2)  I want you to get debrox ear solution at the drugstore (is over the counter)  3)  use is as directed twice weekly for 2-3 weeks 4)  then follow up in 2-3 weeks here for ear flush  5)  if worse or headache or fever - please let me know   Current Allergies (reviewed today): ! LISINOPRIL ! * CHORTHALIDONE ?

## 2010-05-19 NOTE — Letter (Signed)
Summary: Results Follow up Letter  [place] [facility]  [address] [place], [place] [address]   Phone: [phone]  Fax: [phone]    04/12/2010 MRN: [id number]  [facility] [address] [place], [place]  [address]  Dear Elijah Nixon,  The following are the results of your recent test(s):  Test         Result    Pap Smear:        Normal _____  Not Normal _____ Comments: ______________________________________________________ Cholesterol: LDL(Bad cholesterol):         Your goal is less than:         HDL (Good cholesterol):       Your goal is more than: Comments:  ______________________________________________________ Mammogram:        Normal _____  Not Normal _____ Comments:  ___________________________________________________________________ Hemoccult:        Normal _____  Not normal _______ Comments:    _____________________________________________________________________ Other Tests: Bone density scan is stable. Continue your Fosamax, Calcium and Vitamin D. We will recheck in about 2 years.    We routinely do not discuss normal results over the telephone.  If you desire a copy of the results, or you have any questions about this information we can discuss them at your next office visit.   Sincerely,      Sharilyn Sites for Dr. [name]

## 2010-05-19 NOTE — Miscellaneous (Signed)
Summary: [facility]  [address] [facility]   Imported By: Beau Fanny 07/07/2009 10:41:55  _____________________________________________________________________  External Attachment:    Type:   Image     Comment:   External Document

## 2010-05-19 NOTE — Letter (Signed)
Summary: [facility]   Imported By: Lanelle Bal 08/25/2009 11:08:13  _____________________________________________________________________  External Attachment:    Type:   Image     Comment:   External Document

## 2010-07-06 ENCOUNTER — Other Ambulatory Visit: Payer: Self-pay | Admitting: Family Medicine

## 2010-07-06 MED ORDER — AMLODIPINE BESYLATE 10 MG PO TABS: 10.0000 mg | ORAL_TABLET | Freq: Every day | ORAL | Status: DC

## 2010-09-12 ENCOUNTER — Encounter: Payer: Self-pay | Admitting: Family Medicine

## 2010-09-14 ENCOUNTER — Other Ambulatory Visit: Payer: Self-pay

## 2010-09-19 ENCOUNTER — Other Ambulatory Visit (INDEPENDENT_AMBULATORY_CARE_PROVIDER_SITE_OTHER): Payer: Medicare Other | Admitting: Family Medicine

## 2010-09-19 DIAGNOSIS — R7309 Other abnormal glucose: Secondary | ICD-10-CM

## 2010-09-19 DIAGNOSIS — R739 Hyperglycemia, unspecified: Secondary | ICD-10-CM

## 2010-09-19 DIAGNOSIS — Z125 Encounter for screening for malignant neoplasm of prostate: Secondary | ICD-10-CM

## 2010-09-19 DIAGNOSIS — I10 Essential (primary) hypertension: Secondary | ICD-10-CM

## 2010-09-19 LAB — RENAL FUNCTION PANEL
Albumin: 4.1 g/dL (ref 3.5–5.2)
BUN: 9 mg/dL (ref 6–23)
Calcium: 9 mg/dL (ref 8.4–10.5)
Creatinine, Ser: 1 mg/dL (ref 0.4–1.5)
Glucose, Bld: 105 mg/dL — ABNORMAL HIGH (ref 70–99)
Phosphorus: 3.5 mg/dL (ref 2.3–4.6)

## 2010-09-19 LAB — HEPATIC FUNCTION PANEL
ALT: 19 U/L (ref 0–53)
Albumin: 4.1 g/dL (ref 3.5–5.2)
Total Protein: 7 g/dL (ref 6.0–8.3)

## 2010-09-19 LAB — TSH: TSH: 1.21 u[IU]/mL (ref 0.35–5.50)

## 2010-09-19 LAB — PSA: PSA: 2.15 ng/mL (ref 0.10–4.00)

## 2010-09-20 LAB — VITAMIN D 25 HYDROXY (VIT D DEFICIENCY, FRACTURES): Vit D, 25-Hydroxy: 85 ng/mL (ref 30–89)

## 2010-09-21 ENCOUNTER — Ambulatory Visit (INDEPENDENT_AMBULATORY_CARE_PROVIDER_SITE_OTHER): Payer: Medicare Other | Admitting: Family Medicine

## 2010-09-21 ENCOUNTER — Encounter: Payer: Self-pay | Admitting: Family Medicine

## 2010-09-21 DIAGNOSIS — R7309 Other abnormal glucose: Secondary | ICD-10-CM

## 2010-09-21 DIAGNOSIS — M810 Age-related osteoporosis without current pathological fracture: Secondary | ICD-10-CM

## 2010-09-21 DIAGNOSIS — F1010 Alcohol abuse, uncomplicated: Secondary | ICD-10-CM

## 2010-09-21 DIAGNOSIS — F430 Acute stress reaction: Secondary | ICD-10-CM

## 2010-09-21 DIAGNOSIS — I10 Essential (primary) hypertension: Secondary | ICD-10-CM

## 2010-09-21 NOTE — Assessment & Plan Note (Signed)
Again stressed imp of cessation for physical and emotional health Using this to deal with stress Poss in denial about an anxiety problem- disc this today

## 2010-09-21 NOTE — Assessment & Plan Note (Signed)
Vit D level good Finishing last of 5 year course of fosamax Due for dexa in dec/ jan- will disc at f/u

## 2010-09-21 NOTE — Assessment & Plan Note (Signed)
I feel pt has some anx issues  Using etoh to cope Will not admit this Disc stressors and coping Offered counseling- pt declined

## 2010-09-21 NOTE — Patient Instructions (Signed)
Labs are all stable I still feel it is most important to your physical and emotional health to stop drinking alcohol  Keep away from sweets and keep starches to 2 oz or less per serving since sugar is borderline high Walk 10 minutes several times per day  Stop the fosamax when you finish the supply you have Schedule fasting lab and then follow up in 6 months

## 2010-09-21 NOTE — Assessment & Plan Note (Signed)
bp is in good control currently No change in meds Labs rev F/u 6 mo Enc some exercise

## 2010-09-21 NOTE — Assessment & Plan Note (Signed)
Overall stable rec low sugar diet with etoh cessation  Disc diet in detail 6 mo a1c and f/u

## 2010-09-21 NOTE — Progress Notes (Signed)
Subjective:    Patient ID: Elijah Nixon, male    DOB: April 12, 1933, 77 y.o.   MRN: [id number]  HPI Wt is down 4 lb Her to f/u for HTN and hyperglycemia and lipids and ETOH abuse   A lot of stress at home -- a lot of family issues  A lot of turmoil -- different things happening  Also some credit card problems  Does not want to see a counselor -- just feels a bit overwhelmed Does not think he has generalized anxiety but asks for "nerve pills"  Is not increasing alcohol  No thoughts of quitting - honestly does not want to  Does not think it infl his life in a bad way  No liver problems   Also vit D and psa - normal  Is taking vitamin D No problems with urination or nocturia   HT in good control 128/62 No ha or cp or sob  Glucose better at 105-- is eating some better  Is avoiding sweets and watching carbs  Still drinking alcohol, however No regular exercise  Patient Active Problem List  Diagnoses  . ERECTILE DYSFUNCTION  . ALCOHOL ABUSE  . CERUMEN IMPACTION, BILATERAL  . HYPERTENSION  . ACTINIC KERATOSIS  . GEN OSTEOARTHROSIS INVOLVING MULTIPLE SITES  . OSTEOPOROSIS  . SWELLING, MASS, OR LUMP IN CHEST  . HYPERGLYCEMIA  . ADVERSE DRUG REACTION  . COLONIC POLYPS, HX OF  . DIVERTICULITIS, HX OF  . Stress reaction   Past Medical History  Diagnosis Date  . History of diverticulitis of colon   . Hypertension   . Arthritis     OA  . History of colon polyps   . DDD (degenerative disc disease)   . Osteoporosis     spinal compression fx  . Macular degeneration     dry  . AA (alcohol abuse)     at least 8-10 drinks/day  . ED (erectile dysfunction)    Past Surgical History  Procedure Date  . Spine surgery 1971    deg disc disease x 2   History  Substance Use Topics  . Smoking status: Never Smoker   . Smokeless tobacco: Not on file  . Alcohol Use: Yes     7-8 beers per day   Family History  Problem Relation Age of Onset  . Diabetes Son    Allergies    Allergen Reactions  . Lisinopril     REACTION: increased potassium leveles   Current Outpatient Prescriptions on File Prior to Visit  Medication Sig Dispense Refill  . amLODipine (NORVASC) 10 MG tablet Take 1 tablet (10 mg total) by mouth daily.  30 tablet  6  . aspirin 81 MG tablet Take 81 mg by mouth daily.        . Calcium Carbonate-Vit D-Min 600-400 MG-UNIT TABS Take 1 tablet by mouth daily.       Marland Kitchen doxazosin (CARDURA) 8 MG tablet Take 8 mg by mouth daily.        . Multiple Vitamin (MULTIVITAMIN) capsule Take 1 capsule by mouth daily.        . Multiple Vitamins-Minerals (PRESERVISION/LUTEIN) CAPS Take 1 capsule by mouth daily.        Marland Kitchen spironolactone (ALDACTONE) 25 MG tablet Take 25 mg by mouth daily.                Review of Systems Review of Systems  Constitutional: Negative for fever, appetite change, fatigue and unexpected weight change.  Eyes: Negative for pain  and visual disturbance.  Respiratory: Negative for cough and shortness of breath.   Cardiovascular: Negative for cp or palpitation.   Gastrointestinal: Negative for nausea, diarrhea and constipation. neg for abd pain or reflux  Genitourinary: Negative for urgency and frequency.  Skin: Negative for pallor.  Neurological: Negative for weakness, light-headedness, numbness and headaches.  Hematological: Negative for adenopathy. Does not bruise/bleed easily.  Psychiatric/Behavioral: Negative for dysphoric mood. Pos for nervousness/ feeling of anxiety       Objective:   Physical Exam  Constitutional: He appears well-developed and well-nourished. No distress.  HENT:  Head: Normocephalic and atraumatic.  Right Ear: External ear normal.  Left Ear: External ear normal.  Nose: Nose normal.  Mouth/Throat: Oropharynx is clear and moist.  Eyes: Conjunctivae are normal. Pupils are equal, round, and reactive to light.  Neck: Normal range of motion. Neck supple. No JVD present. Carotid bruit is not present. No  thyromegaly present.  Cardiovascular: Normal rate and regular rhythm.   Pulmonary/Chest: Effort normal and breath sounds normal. No respiratory distress. He has no wheezes. He has no rales.       Diffusely distant bs  Abdominal: Soft. Bowel sounds are normal. He exhibits no distension, no pulsatile liver, no fluid wave, no abdominal bruit and no mass. There is no tenderness.  Musculoskeletal: He exhibits no edema and no tenderness.  Lymphadenopathy:    He has no cervical adenopathy.  Neurological: He is alert. He has normal reflexes. Coordination normal.       Hand tremor baseline  Skin: Skin is warm and dry. No rash noted. No erythema. No pallor.  Psychiatric:       Seems mildly anxious today          Assessment & Plan:

## 2010-12-26 ENCOUNTER — Other Ambulatory Visit: Payer: Self-pay

## 2010-12-26 MED ORDER — DOXAZOSIN MESYLATE 8 MG PO TABS: 8.0000 mg | ORAL_TABLET | Freq: Every day | ORAL | Status: DC

## 2010-12-26 NOTE — Telephone Encounter (Signed)
[facility] faxed refill request Doxazosin 8 mg #90 x 1. Pt already has scheduled appt with Dr Milinda Antis in 03/2011.

## 2011-01-09 ENCOUNTER — Other Ambulatory Visit: Payer: Self-pay | Admitting: *Deleted

## 2011-01-09 MED ORDER — AMLODIPINE BESYLATE 10 MG PO TABS: 10.0000 mg | ORAL_TABLET | Freq: Every day | ORAL | Status: DC

## 2011-01-09 MED ORDER — SPIRONOLACTONE 25 MG PO TABS: 25.0000 mg | ORAL_TABLET | Freq: Every day | ORAL | Status: DC

## 2011-03-01 ENCOUNTER — Ambulatory Visit: Payer: Medicare Other

## 2011-03-20 ENCOUNTER — Other Ambulatory Visit (INDEPENDENT_AMBULATORY_CARE_PROVIDER_SITE_OTHER): Payer: Medicare Other

## 2011-03-20 DIAGNOSIS — I10 Essential (primary) hypertension: Secondary | ICD-10-CM

## 2011-03-20 DIAGNOSIS — R7309 Other abnormal glucose: Secondary | ICD-10-CM

## 2011-03-20 LAB — LIPID PANEL
Cholesterol: 212 mg/dL — ABNORMAL HIGH (ref 0–200)
HDL: 106.7 mg/dL (ref >39.00)
Triglycerides: 72 mg/dL (ref 0.0–149.0)

## 2011-03-20 LAB — HEMOGLOBIN A1C: Hgb A1c MFr Bld: 5.3 % (ref 4.6–6.5)

## 2011-03-20 LAB — HEPATIC FUNCTION PANEL
ALT: 16 U/L (ref 0–53)
AST: 21 U/L (ref 0–37)
Albumin: 4.1 g/dL (ref 3.5–5.2)
Total Protein: 7.6 g/dL (ref 6.0–8.3)

## 2011-03-20 LAB — RENAL FUNCTION PANEL
BUN: 8 mg/dL (ref 6–23)
CO2: 27 mEq/L (ref 19–32)
Calcium: 9.2 mg/dL (ref 8.4–10.5)
Creatinine, Ser: 0.9 mg/dL (ref 0.4–1.5)

## 2011-03-27 ENCOUNTER — Ambulatory Visit (INDEPENDENT_AMBULATORY_CARE_PROVIDER_SITE_OTHER): Payer: Medicare Other | Admitting: Family Medicine

## 2011-03-27 ENCOUNTER — Encounter: Payer: Self-pay | Admitting: Family Medicine

## 2011-03-27 VITALS — BP 124/64 | HR 88 | Temp 97.9°F | Ht 67.0 in | Wt 160.5 lb

## 2011-03-27 DIAGNOSIS — F1010 Alcohol abuse, uncomplicated: Secondary | ICD-10-CM

## 2011-03-27 DIAGNOSIS — I10 Essential (primary) hypertension: Secondary | ICD-10-CM

## 2011-03-27 DIAGNOSIS — R7309 Other abnormal glucose: Secondary | ICD-10-CM

## 2011-03-27 MED ORDER — AMLODIPINE BESYLATE 10 MG PO TABS: 10.0000 mg | ORAL_TABLET | Freq: Every day | ORAL | Status: DC

## 2011-03-27 MED ORDER — DOXAZOSIN MESYLATE 8 MG PO TABS: 8.0000 mg | ORAL_TABLET | Freq: Every day | ORAL | Status: DC

## 2011-03-27 MED ORDER — SPIRONOLACTONE 25 MG PO TABS: 25.0000 mg | ORAL_TABLET | Freq: Every day | ORAL | Status: DC

## 2011-03-27 NOTE — Patient Instructions (Signed)
Try to work on some exercise 5 days per week- in or outdoors Keep working on [employer] thinking about cutting the alcohol to eventually quit  refils to [place] today Schedule 30 check up in 6 months with labs prior

## 2011-03-27 NOTE — Assessment & Plan Note (Signed)
bp in fair control at this time  No changes needed  Disc lifstyle change with low sodium diet and exercise   Medicines refilled  Disc alcohol cessation-not interested

## 2011-03-27 NOTE — Assessment & Plan Note (Signed)
Very good a1c less that 6  Rev low glycemic diet  Needs to quit alcohol Needs to inc exercise- disc plan for this

## 2011-03-27 NOTE — Progress Notes (Signed)
Subjective:    Patient ID: Elijah Nixon, male    DOB: [birth year], 77 y.o.   MRN: [id number]  HPI Here for f/u of chronic conditions incl HTN and hyperglycemia and alcohol abuse Feels ok - needs refils No new medical issues   Wt is up 4 lb bmi is 25  bp is  124/64   Today Well controlled on norvasc and cardura and aldactone No cp or palpitations or headaches or edema  No side effects to medicines  - does great  Chemistries ok    Chemistry      Component Value Date/Time   NA 138 03/20/2011 0854   K 4.3 03/20/2011 0854   CL 102 03/20/2011 0854   CO2 27 03/20/2011 0854   BUN 8 03/20/2011 0854   CREATININE 0.9 03/20/2011 0854      Component Value Date/Time   CALCIUM 9.2 03/20/2011 0854   ALKPHOS 51 03/20/2011 0854   AST 21 03/20/2011 0854   ALT 16 03/20/2011 0854   BILITOT 0.8 03/20/2011 0854      Hyperglycemia in very good control  a1c is 5.3 Diet - almost never eats sweets , and small portions of carbs like bread  Not as much exercise lately - likes to walk , occ lifts weights  Back limits him  Mood is good - PHQ-9 - score is 0  Alcohol intake  - is fair - trying to cut back (not interested in rehab) More than 3 drinks per day  Not affecting life or relationships  He takes mvi every day- centrum   Patient Active Problem List  Diagnoses  . ERECTILE DYSFUNCTION  . ALCOHOL ABUSE  . CERUMEN IMPACTION, BILATERAL  . HYPERTENSION  . ACTINIC KERATOSIS  . GEN OSTEOARTHROSIS INVOLVING MULTIPLE SITES  . OSTEOPOROSIS  . SWELLING, MASS, OR LUMP IN CHEST  . HYPERGLYCEMIA  . ADVERSE DRUG REACTION  . COLONIC POLYPS, HX OF  . DIVERTICULITIS, HX OF  . Stress reaction   Past Medical History  Diagnosis Date  . History of diverticulitis of colon   . Hypertension   . Arthritis     OA  . History of colon polyps   . DDD (degenerative disc disease)   . Osteoporosis     spinal compression fx  . Macular degeneration     dry  . AA (alcohol abuse)     at least 8-10 drinks/day    . ED (erectile dysfunction)    Past Surgical History  Procedure Date  . Spine surgery 1971    deg disc disease x 2   History  Substance Use Topics  . Smoking status: Never Smoker   . Smokeless tobacco: Not on file  . Alcohol Use: Yes     7-8 beers per day   Family History  Problem Relation Age of Onset  . Diabetes Son    Allergies  Allergen Reactions  . Lisinopril     REACTION: increased potassium leveles   Current Outpatient Prescriptions on File Prior to Visit  Medication Sig Dispense Refill  . aspirin 81 MG tablet Take 81 mg by mouth daily.        . Calcium Carbonate-Vit D-Min 600-400 MG-UNIT TABS Take 1 tablet by mouth daily.       . Multiple Vitamin (MULTIVITAMIN) capsule Take 1 capsule by mouth daily.        . Multiple Vitamins-Minerals (PRESERVISION/LUTEIN) CAPS Take 1 capsule by mouth daily.  Review of Systems Review of Systems  Constitutional: Negative for fever, appetite change, fatigue and unexpected weight change.  Eyes: Negative for pain and visual disturbance.  Respiratory: Negative for cough and shortness of breath.   Cardiovascular: Negative for cp or palpitations    Gastrointestinal: Negative for nausea, diarrhea and constipation.  Genitourinary: Negative for urgency and frequency.  Skin: Negative for pallor or rash   Neurological: Negative for weakness, light-headedness, numbness and headaches. does have a mild hand tremor  Hematological: Negative for adenopathy. Does not bruise/bleed easily.  Psychiatric/Behavioral: Negative for dysphoric mood. The patient is not nervous/anxious.          Objective:   Physical Exam  Constitutional: He appears well-developed and well-nourished. No distress.       Somewhat frail appearing elderly male   HENT:  Head: Normocephalic and atraumatic.  Mouth/Throat: Oropharynx is clear and moist.  Eyes: Conjunctivae and EOM are normal. Pupils are equal, round, and reactive to light. No scleral icterus.   Neck: Normal range of motion. Neck supple. No JVD present. Carotid bruit is not present. Erythema present. No thyromegaly present.  Cardiovascular: Normal rate, regular rhythm, normal heart sounds and intact distal pulses.  Exam reveals no gallop.   Pulmonary/Chest: Effort normal and breath sounds normal. No respiratory distress. He has no wheezes.  Abdominal: Normal appearance and bowel sounds are normal. He exhibits no shifting dullness, no distension, no pulsatile liver, no fluid wave, no abdominal bruit, no ascites and no mass.       Normal but protuberant  Musculoskeletal: Normal range of motion. He exhibits no edema and no tenderness.  Lymphadenopathy:    He has no cervical adenopathy.  Neurological: He is alert. He has normal reflexes. He displays tremor. No cranial nerve deficit. He exhibits normal muscle tone. Coordination normal.  Skin: Skin is warm and dry. No rash noted. No erythema. No pallor.  Psychiatric: He has a normal mood and affect.       Affect normal - but seems guarded when discussing alcohol and self care            Assessment & Plan:

## 2011-03-27 NOTE — Assessment & Plan Note (Signed)
Today pt will not quantify alcohol intake today except for over 3 drinks per day No interest in quitting or talking about it I again rev health risks  Taking mvi Has tremor today

## 2011-09-19 ENCOUNTER — Telehealth: Payer: Self-pay | Admitting: Family Medicine

## 2011-09-19 DIAGNOSIS — I10 Essential (primary) hypertension: Secondary | ICD-10-CM

## 2011-09-19 DIAGNOSIS — R7309 Other abnormal glucose: Secondary | ICD-10-CM

## 2011-09-19 DIAGNOSIS — M810 Age-related osteoporosis without current pathological fracture: Secondary | ICD-10-CM

## 2011-09-19 NOTE — Telephone Encounter (Signed)
Message copied by Judy Pimple on Tue Sep 19, 2011  8:18 PM ------      Message from: Alvina Chou      Created: Thu Sep 14, 2011  3:22 PM      Regarding: Lab orders for 6-6, Thursday       Labs for 6 month f/u appt

## 2011-09-20 ENCOUNTER — Other Ambulatory Visit (INDEPENDENT_AMBULATORY_CARE_PROVIDER_SITE_OTHER): Payer: Medicare Other

## 2011-09-20 DIAGNOSIS — R7309 Other abnormal glucose: Secondary | ICD-10-CM

## 2011-09-20 DIAGNOSIS — I10 Essential (primary) hypertension: Secondary | ICD-10-CM

## 2011-09-20 DIAGNOSIS — M810 Age-related osteoporosis without current pathological fracture: Secondary | ICD-10-CM

## 2011-09-20 LAB — COMPREHENSIVE METABOLIC PANEL
ALT: 21 U/L (ref 0–53)
Albumin: 4.2 g/dL (ref 3.5–5.2)
CO2: 26 mEq/L (ref 19–32)
Chloride: 103 mEq/L (ref 96–112)
GFR: 91.71 mL/min (ref >60.00)
Potassium: 4.3 mEq/L (ref 3.5–5.1)
Sodium: 137 mEq/L (ref 135–145)
Total Bilirubin: 0.9 mg/dL (ref 0.3–1.2)
Total Protein: 7.6 g/dL (ref 6.0–8.3)

## 2011-09-20 LAB — CBC WITH DIFFERENTIAL/PLATELET
Basophils Relative: 0.7 % (ref 0.0–3.0)
Eosinophils Absolute: 0.2 10*3/uL (ref 0.0–0.7)
MCHC: 34 g/dL (ref 30.0–36.0)
MCV: 103.6 fl — ABNORMAL HIGH (ref 78.0–100.0)
Monocytes Absolute: 0.5 10*3/uL (ref 0.1–1.0)
Neutro Abs: 2.7 10*3/uL (ref 1.4–7.7)
Neutrophils Relative %: 50.4 % (ref 43.0–77.0)
RBC: 4.29 Mil/uL (ref 4.22–5.81)

## 2011-09-27 ENCOUNTER — Encounter: Payer: Self-pay | Admitting: Family Medicine

## 2011-09-27 ENCOUNTER — Ambulatory Visit (INDEPENDENT_AMBULATORY_CARE_PROVIDER_SITE_OTHER): Payer: Medicare Other | Admitting: Family Medicine

## 2011-09-27 VITALS — BP 122/60 | HR 84 | Temp 97.7°F | Ht 67.0 in | Wt 159.0 lb

## 2011-09-27 DIAGNOSIS — I10 Essential (primary) hypertension: Secondary | ICD-10-CM

## 2011-09-27 DIAGNOSIS — M810 Age-related osteoporosis without current pathological fracture: Secondary | ICD-10-CM

## 2011-09-27 DIAGNOSIS — F1010 Alcohol abuse, uncomplicated: Secondary | ICD-10-CM

## 2011-09-27 DIAGNOSIS — R7309 Other abnormal glucose: Secondary | ICD-10-CM

## 2011-09-27 NOTE — Assessment & Plan Note (Signed)
Vit D level god at 42- lot of outdoor time Disc imp of exercise

## 2011-09-27 NOTE — Patient Instructions (Addendum)
Stable labs Stay active Avoid red meat/ fried foods/ egg yolks/ fatty breakfast meats/ butter, cheese and high fat dairy/ and shellfish   Continue your multivitamin daily  Get an extra folic acid supplement - take daily - at least 400 mcg daily  Wear sun protection  Think about cutting down or quitting alcohol in the future - it will eventually affect your health  Follow up in 6 months with labs prior

## 2011-09-27 NOTE — Assessment & Plan Note (Signed)
Remains ok with a1c of 5.5- pretty much resolved at this time Disc imp of low glycemic diet and stopping alcohol

## 2011-09-27 NOTE — Assessment & Plan Note (Signed)
bp in fair control at this time  No changes needed  Disc lifstyle change with low sodium diet and exercise   Labs rev with pt today

## 2011-09-27 NOTE — Assessment & Plan Note (Signed)
Pt not interested in quitting Disc dangers to health from alcohol and safety concerns  Has a tremor On mvi- asked him to add folic acid 400 mcg daily  Disc AA- pt declines, he does not want to change his drinking habits / in denial about alcoholism despite frank disc about it  Liver tests stable -rev labs

## 2011-09-27 NOTE — Progress Notes (Signed)
Subjective:    Patient ID: Elijah Nixon, male    DOB: 10/28/1932, 79 y.o.   MRN: [id number]  HPI Here for f/u of chronic conditions   Doing well  Nothing new going on   bp is  good   Today BP Readings from Last 3 Encounters:  09/27/11 122/60  03/27/11 124/64  09/21/10 128/62    No cp or palpitations or headaches or edema  No side effects to medicines     Chemistry      Component Value Date/Time   NA 137 09/20/2011 0827   K 4.3 09/20/2011 0827   CL 103 09/20/2011 0827   CO2 26 09/20/2011 0827   BUN 7 09/20/2011 0827   CREATININE 0.9 09/20/2011 0827      Component Value Date/Time   CALCIUM 9.4 09/20/2011 0827   ALKPHOS 56 09/20/2011 0827   AST 26 09/20/2011 0827   ALT 21 09/20/2011 0827   BILITOT 0.9 09/20/2011 0827      Lab Results  Component Value Date   CHOL 212* 03/20/2011   HDL 106.70 03/20/2011   LDLCALC 75 10/27/2008   LDLDIRECT 85.0 03/20/2011   TRIG 72.0 03/20/2011   CHOLHDL 2 03/20/2011     ETOH-- is about the same - drinks ? Many beers per day- cannot quantify  No black outs or car accidents   Hyperglycemia Lab Results  Component Value Date   HGBA1C 5.5 09/20/2011     Wt is stable with bmi of 24  OP- vit D level is 80 Is outside a fair amount  Takes mvi every day   Patient Active Problem List  Diagnosis  . ERECTILE DYSFUNCTION  . ALCOHOL ABUSE  . CERUMEN IMPACTION, BILATERAL  . HYPERTENSION  . ACTINIC KERATOSIS  . GEN OSTEOARTHROSIS INVOLVING MULTIPLE SITES  . OSTEOPOROSIS  . SWELLING, MASS, OR LUMP IN CHEST  . HYPERGLYCEMIA  . ADVERSE DRUG REACTION  . COLONIC POLYPS, HX OF  . DIVERTICULITIS, HX OF  . Stress reaction   Past Medical History  Diagnosis Date  . History of diverticulitis of colon   . Hypertension   . Arthritis     OA  . History of colon polyps   . DDD (degenerative disc disease)   . Osteoporosis     spinal compression fx  . Macular degeneration     dry  . AA (alcohol abuse)     at least 8-10 drinks/day  . ED (erectile dysfunction)     Past Surgical History  Procedure Date  . Spine surgery 1971    deg disc disease x 2   History  Substance Use Topics  . Smoking status: Never Smoker   . Smokeless tobacco: Never Used  . Alcohol Use: Yes     7-8 beers per day   Family History  Problem Relation Age of Onset  . Diabetes Son    Allergies  Allergen Reactions  . Lisinopril     REACTION: increased potassium leveles   Current Outpatient Prescriptions on File Prior to Visit  Medication Sig Dispense Refill  . amLODipine (NORVASC) 10 MG tablet Take 1 tablet (10 mg total) by mouth daily.  90 tablet  3  . aspirin 81 MG tablet Take 81 mg by mouth daily.        . Calcium Carbonate-Vit D-Min 600-400 MG-UNIT TABS Take 1 tablet by mouth daily.       Marland Kitchen doxazosin (CARDURA) 8 MG tablet Take 1 tablet (8 mg total) by mouth daily.  90 tablet  3  . Multiple Vitamin (MULTIVITAMIN) capsule Take 1 capsule by mouth daily.        . Multiple Vitamins-Minerals (PRESERVISION/LUTEIN) CAPS Take 1 capsule by mouth daily.        Marland Kitchen spironolactone (ALDACTONE) 25 MG tablet Take 1 tablet (25 mg total) by mouth daily.  90 tablet  3      Review of Systems Review of Systems  Constitutional: Negative for fever, appetite change, fatigue and unexpected weight change.  Eyes: Negative for pain and visual disturbance.  Respiratory: Negative for cough and shortness of breath.   Cardiovascular: Negative for cp or palpitations    Gastrointestinal: Negative for nausea, diarrhea and constipation.  Genitourinary: Negative for urgency and frequency.  Skin: Negative for pallor or rash   Neurological: Negative for weakness, light-headedness, numbness and headaches.  Hematological: Negative for adenopathy. Does not bruise/bleed easily.  Psychiatric/Behavioral: Negative for dysphoric mood. The patient is not nervous/anxious.         Objective:   Physical Exam  Constitutional: He appears well-developed and well-nourished. No distress.       Frail appearing  elderly male  HENT:  Head: Normocephalic and atraumatic.  Right Ear: External ear normal.  Left Ear: External ear normal.  Mouth/Throat: Oropharynx is clear and moist.  Eyes: Conjunctivae and EOM are normal. Pupils are equal, round, and reactive to light. Right eye exhibits no discharge. Left eye exhibits no discharge.  Neck: Normal range of motion. Neck supple. No JVD present. No thyromegaly present.  Cardiovascular: Normal rate, regular rhythm, normal heart sounds and intact distal pulses.  Exam reveals no gallop.   Pulmonary/Chest: Effort normal and breath sounds normal. No respiratory distress. He has no wheezes.  Abdominal: Soft. Bowel sounds are normal. He exhibits no distension and no mass. There is no tenderness.       Protuberant abdomen- no ascites   Musculoskeletal: He exhibits no edema and no tenderness.  Lymphadenopathy:    He has no cervical adenopathy.  Neurological: He is alert. He has normal reflexes. He displays tremor. No cranial nerve deficit. He exhibits normal muscle tone. Coordination normal.  Skin: Skin is warm and dry. No rash noted. No erythema. No pallor.  Psychiatric: He has a normal mood and affect.          Assessment & Plan:

## 2011-09-28 ENCOUNTER — Telehealth: Payer: Self-pay

## 2011-09-28 NOTE — Telephone Encounter (Signed)
Pt request copy of 09/2011 labs mailed to confirmed home address. Pt notified while on phone info mailed.

## 2012-03-19 ENCOUNTER — Other Ambulatory Visit: Payer: Self-pay | Admitting: Family Medicine

## 2012-03-22 ENCOUNTER — Telehealth: Payer: Self-pay | Admitting: Family Medicine

## 2012-03-22 ENCOUNTER — Other Ambulatory Visit (INDEPENDENT_AMBULATORY_CARE_PROVIDER_SITE_OTHER): Payer: Medicare Other

## 2012-03-22 DIAGNOSIS — R7309 Other abnormal glucose: Secondary | ICD-10-CM

## 2012-03-22 DIAGNOSIS — I10 Essential (primary) hypertension: Secondary | ICD-10-CM

## 2012-03-22 LAB — COMPREHENSIVE METABOLIC PANEL
CO2: 26 mEq/L (ref 19–32)
Calcium: 9.1 mg/dL (ref 8.4–10.5)
Chloride: 101 mEq/L (ref 96–112)
Creatinine, Ser: 0.9 mg/dL (ref 0.4–1.5)
GFR: 86.91 mL/min (ref >60.00)
Glucose, Bld: 97 mg/dL (ref 70–99)
Total Bilirubin: 0.6 mg/dL (ref 0.3–1.2)
Total Protein: 7.5 g/dL (ref 6.0–8.3)

## 2012-03-22 LAB — LIPID PANEL
HDL: 104.4 mg/dL (ref >39.00)
Triglycerides: 154 mg/dL — ABNORMAL HIGH (ref 0.0–149.0)

## 2012-03-22 LAB — HEMOGLOBIN A1C: Hgb A1c MFr Bld: 5.6 % (ref 4.6–6.5)

## 2012-03-22 LAB — LDL CHOLESTEROL, DIRECT: Direct LDL: 86.4 mg/dL

## 2012-03-22 NOTE — Telephone Encounter (Signed)
Message copied by Judy Pimple on Fri Mar 22, 2012  7:43 AM ------      Message from: Alvina Chou      Created: Mon Mar 18, 2012  5:06 PM      Regarding: Lab orders for Friday, 12.6.13       Labs for 6 month f/u

## 2012-03-26 ENCOUNTER — Other Ambulatory Visit: Payer: Self-pay | Admitting: Family Medicine

## 2012-03-29 ENCOUNTER — Ambulatory Visit (INDEPENDENT_AMBULATORY_CARE_PROVIDER_SITE_OTHER): Payer: Medicare Other | Admitting: Family Medicine

## 2012-03-29 ENCOUNTER — Encounter: Payer: Self-pay | Admitting: Family Medicine

## 2012-03-29 VITALS — BP 138/52 | HR 106 | Temp 97.8°F | Ht 67.0 in | Wt 163.8 lb

## 2012-03-29 DIAGNOSIS — R7309 Other abnormal glucose: Secondary | ICD-10-CM

## 2012-03-29 DIAGNOSIS — Z23 Encounter for immunization: Secondary | ICD-10-CM

## 2012-03-29 DIAGNOSIS — F1010 Alcohol abuse, uncomplicated: Secondary | ICD-10-CM

## 2012-03-29 DIAGNOSIS — I10 Essential (primary) hypertension: Secondary | ICD-10-CM

## 2012-03-29 DIAGNOSIS — L821 Other seborrheic keratosis: Secondary | ICD-10-CM | POA: Insufficient documentation

## 2012-03-29 MED ORDER — SPIRONOLACTONE 25 MG PO TABS: 25.0000 mg | ORAL_TABLET | Freq: Every day | ORAL | Status: DC

## 2012-03-29 MED ORDER — AMLODIPINE BESYLATE 10 MG PO TABS: 10.0000 mg | ORAL_TABLET | Freq: Every day | ORAL | Status: DC

## 2012-03-29 MED ORDER — DOXAZOSIN MESYLATE 8 MG PO TABS: 8.0000 mg | ORAL_TABLET | Freq: Every day | ORAL | Status: DC

## 2012-03-29 NOTE — Assessment & Plan Note (Signed)
bp in fair control at this time  No changes needed  Disc lifstyle change with low sodium diet and exercise   Labs reviewed Urged to quit alcohol

## 2012-03-29 NOTE — Assessment & Plan Note (Signed)
Expect alcohol use is heavier with stressors and pt's tremor is worse  Urged to quit or at least cut down Pt did not want to discuss it-is not ready to quit or admit he has a problem

## 2012-03-29 NOTE — Progress Notes (Signed)
Subjective:    Patient ID: Elijah Nixon, male    DOB: [birth year], 78 y.o.   MRN: [id number]  HPI Here for f/u of chronic health conditions  Has had some sensitivity in breast / nipple area on both sides  No enlargement  No trauma   A lot of stress lately  Always tense in shoulders - stays tensed up    Wt is up 4 lb with bmi of 25 Eating pretty good - but does not overeat  No regular exercise - understands this may help stress Has an active lifestyle   Flu vaccine -got that today   Lab Results  Component Value Date   CHOL 214* 03/22/2012   CHOL 212* 03/20/2011   CHOL 227* 02/25/2010   Lab Results  Component Value Date   HDL 104.40 03/22/2012   HDL 106.70 03/20/2011   HDL 132.50 02/25/2010   Lab Results  Component Value Date   LDLCALC 75 10/27/2008   Lab Results  Component Value Date   TRIG 154.0* 03/22/2012   TRIG 72.0 03/20/2011   TRIG 47.0 02/25/2010   Lab Results  Component Value Date   CHOLHDL 2 03/22/2012   CHOLHDL 2 03/20/2011   CHOLHDL 2 02/25/2010   Lab Results  Component Value Date   LDLDIRECT 86.4 03/22/2012   LDLDIRECT 85.0 03/20/2011   LDLDIRECT 81.3 02/25/2010     Hyperglycemia  Lab Results  Component Value Date   HGBA1C 5.6 03/22/2012    bp is stable today  No cp or palpitations or headaches or edema  No side effects to medicines  BP Readings from Last 3 Encounters:  03/29/12 138/52  09/27/11 122/60  03/27/11 124/64     Alcohol intake - about the same and does not intend to quit due to stress  Also denial   Patient Active Problem List  Diagnosis  . ERECTILE DYSFUNCTION  . ALCOHOL ABUSE  . CERUMEN IMPACTION, BILATERAL  . HYPERTENSION  . ACTINIC KERATOSIS  . GEN OSTEOARTHROSIS INVOLVING MULTIPLE SITES  . OSTEOPOROSIS  . SWELLING, MASS, OR LUMP IN CHEST  . HYPERGLYCEMIA  . ADVERSE DRUG REACTION  . COLONIC POLYPS, HX OF  . DIVERTICULITIS, HX OF  . Stress reaction   Past Medical History  Diagnosis Date  . History of  diverticulitis of colon   . Hypertension   . Arthritis     OA  . History of colon polyps   . DDD (degenerative disc disease)   . Osteoporosis     spinal compression fx  . Macular degeneration     dry  . AA (alcohol abuse)     at least 8-10 drinks/day  . ED (erectile dysfunction)    Past Surgical History  Procedure Date  . Spine surgery 1971    deg disc disease x 2   History  Substance Use Topics  . Smoking status: Never Smoker   . Smokeless tobacco: Never Used  . Alcohol Use: Yes     Comment: 7-8 beers per day   Family History  Problem Relation Age of Onset  . Diabetes Son    Allergies  Allergen Reactions  . Lisinopril     REACTION: increased potassium leveles   Current Outpatient Prescriptions on File Prior to Visit  Medication Sig Dispense Refill  . amLODipine (NORVASC) 10 MG tablet Take 1 tablet (10 mg total) by mouth daily.  90 tablet  3  . aspirin 81 MG tablet Take 81 mg by mouth daily.        Marland Kitchen  Calcium Carbonate-Vit D-Min 600-400 MG-UNIT TABS Take 1 tablet by mouth daily.       Marland Kitchen doxazosin (CARDURA) 8 MG tablet TAKE ONE (1) TABLET BY MOUTH EVERY      DAY  90 tablet  0  . folic acid (FOLVITE) 400 MCG tablet Take 400 mcg by mouth daily.      . Multiple Vitamin (MULTIVITAMIN) capsule Take 1 capsule by mouth daily.        . Multiple Vitamins-Minerals (PRESERVISION/LUTEIN) CAPS Take 1 capsule by mouth daily.        Marland Kitchen spironolactone (ALDACTONE) 25 MG tablet TAKE ONE (1) TABLET BY MOUTH EVERY      DAY  90 tablet  0     Review of Systems Review of Systems  Constitutional: Negative for fever, appetite change, fatigue and unexpected weight change.  Eyes: Negative for pain and visual disturbance.  Respiratory: Negative for cough and shortness of breath.   Cardiovascular: Negative for cp or palpitations    Gastrointestinal: Negative for nausea, diarrhea and constipation.  Genitourinary: Negative for urgency and frequency.  Skin: Negative for pallor or rash  pos for  seb [place] Neurological: Negative for weakness, light-headedness, numbness and headaches.  Hematological: Negative for adenopathy. Does not bruise/bleed easily.  Psychiatric/Behavioral: Negative for dysphoric mood. The patient is nervous/anxious. Pos for enormous stress        Objective:   Physical Exam  Constitutional: He appears well-developed and well-nourished. No distress.       Anxious and somewhat frail appearing   HENT:  Head: Normocephalic and atraumatic.  Eyes: Conjunctivae normal and EOM are normal. Pupils are equal, round, and reactive to light. Right eye exhibits no discharge. Left eye exhibits no discharge. No scleral icterus.  Neck: Normal range of motion. Neck supple. No JVD present. Carotid bruit is not present. No thyromegaly present.  Cardiovascular: Normal rate, regular rhythm, normal heart sounds and intact distal pulses.  Exam reveals no gallop.   Pulmonary/Chest: Effort normal and breath sounds normal. No respiratory distress. He has no wheezes.  Abdominal: Soft. Bowel sounds are normal. He exhibits no distension, no abdominal bruit and no mass. There is no tenderness.  Genitourinary:       No breast enlargement/ lumps or skin change   Musculoskeletal: He exhibits no edema.  Lymphadenopathy:    He has no cervical adenopathy.  Neurological: He is alert. He has normal reflexes. He displays tremor.  Skin: Skin is warm and dry. No rash noted. No erythema. No pallor.       Large brown waxy SK on mid back  Came off during exam  Psychiatric: His speech is normal and behavior is normal. Judgment and thought content normal. His mood appears anxious. His affect is not blunt and not labile. Cognition and memory are normal. He exhibits a depressed mood. He expresses no homicidal and no suicidal ideation.          Assessment & Plan:

## 2012-03-29 NOTE — Patient Instructions (Addendum)
Follow up in 6 months for annual exam with labs prior Flu shot today  If you are ever interested in seeing a counselor for stress- let me know  Eat a healthy diet  Please try to cut back and eventually quit alcohol

## 2012-03-29 NOTE — Assessment & Plan Note (Signed)
On large lesion on mid back fell off while examining it - urged to keep area clean and dry- covered with a band aid

## 2012-03-29 NOTE — Assessment & Plan Note (Signed)
Lab Results  Component Value Date   HGBA1C 5.6 03/22/2012    Disc low glycemic diet and good habits F/u 6 mo

## 2012-09-18 ENCOUNTER — Telehealth: Payer: Self-pay | Admitting: Family Medicine

## 2012-09-18 DIAGNOSIS — I10 Essential (primary) hypertension: Secondary | ICD-10-CM

## 2012-09-18 DIAGNOSIS — R7309 Other abnormal glucose: Secondary | ICD-10-CM

## 2012-09-18 DIAGNOSIS — M810 Age-related osteoporosis without current pathological fracture: Secondary | ICD-10-CM

## 2012-09-18 NOTE — Telephone Encounter (Signed)
Message copied by Judy Pimple on Wed Sep 18, 2012 10:34 PM ------      Message from: Alvina Chou      Created: Wed Sep 04, 2012  4:50 PM      Regarding: Lab orders for Friday, 6.6.14       Patient is scheduled for CPX labs, please order future labs, Thanks , [name]       ------

## 2012-09-20 ENCOUNTER — Other Ambulatory Visit (INDEPENDENT_AMBULATORY_CARE_PROVIDER_SITE_OTHER): Payer: Medicare PPO

## 2012-09-20 DIAGNOSIS — I10 Essential (primary) hypertension: Secondary | ICD-10-CM

## 2012-09-20 DIAGNOSIS — M810 Age-related osteoporosis without current pathological fracture: Secondary | ICD-10-CM

## 2012-09-20 DIAGNOSIS — R7309 Other abnormal glucose: Secondary | ICD-10-CM

## 2012-09-20 LAB — LIPID PANEL: Triglycerides: 129 mg/dL (ref 0.0–149.0)

## 2012-09-20 LAB — CBC WITH DIFFERENTIAL/PLATELET
Basophils Absolute: 0 10*3/uL (ref 0.0–0.1)
Eosinophils Relative: 2.7 % (ref 0.0–5.0)
HCT: 42.3 % (ref 39.0–52.0)
Hemoglobin: 14.7 g/dL (ref 13.0–17.0)
Lymphocytes Relative: 29.8 % (ref 12.0–46.0)
Monocytes Relative: 8.8 % (ref 3.0–12.0)
Neutro Abs: 3.4 10*3/uL (ref 1.4–7.7)
RBC: 4.07 Mil/uL — ABNORMAL LOW (ref 4.22–5.81)
RDW: 12.9 % (ref 11.5–14.6)
WBC: 5.9 10*3/uL (ref 4.5–10.5)

## 2012-09-20 LAB — HEMOGLOBIN A1C: Hgb A1c MFr Bld: 5.5 % (ref 4.6–6.5)

## 2012-09-20 LAB — COMPREHENSIVE METABOLIC PANEL
ALT: 17 U/L (ref 0–53)
CO2: 25 mEq/L (ref 19–32)
Calcium: 9.4 mg/dL (ref 8.4–10.5)
Chloride: 100 mEq/L (ref 96–112)
Creatinine, Ser: 0.9 mg/dL (ref 0.4–1.5)
GFR: 89.07 mL/min (ref >60.00)
Glucose, Bld: 97 mg/dL (ref 70–99)
Total Protein: 7.5 g/dL (ref 6.0–8.3)

## 2012-09-20 LAB — TSH: TSH: 1.47 u[IU]/mL (ref 0.35–5.50)

## 2012-09-21 LAB — VITAMIN D 25 HYDROXY (VIT D DEFICIENCY, FRACTURES): Vit D, 25-Hydroxy: 75 ng/mL (ref 30–89)

## 2012-09-27 ENCOUNTER — Encounter: Payer: Self-pay | Admitting: Family Medicine

## 2012-09-27 ENCOUNTER — Ambulatory Visit (INDEPENDENT_AMBULATORY_CARE_PROVIDER_SITE_OTHER): Payer: Medicare PPO | Admitting: Family Medicine

## 2012-09-27 VITALS — BP 132/86 | HR 96 | Temp 97.9°F | Ht 66.25 in | Wt 156.8 lb

## 2012-09-27 DIAGNOSIS — F1010 Alcohol abuse, uncomplicated: Secondary | ICD-10-CM

## 2012-09-27 DIAGNOSIS — Z86010 Personal history of colonic polyps: Secondary | ICD-10-CM

## 2012-09-27 DIAGNOSIS — M810 Age-related osteoporosis without current pathological fracture: Secondary | ICD-10-CM

## 2012-09-27 DIAGNOSIS — Z0000 Encounter for general adult medical examination without abnormal findings: Secondary | ICD-10-CM | POA: Insufficient documentation

## 2012-09-27 DIAGNOSIS — R7309 Other abnormal glucose: Secondary | ICD-10-CM

## 2012-09-27 DIAGNOSIS — I10 Essential (primary) hypertension: Secondary | ICD-10-CM

## 2012-09-27 NOTE — Assessment & Plan Note (Signed)
Had 5 y recall due upcoming nov- but pt declines it at this time due to age

## 2012-09-27 NOTE — Assessment & Plan Note (Signed)
Counseled on health problems assoc with it  Tremor persists/ however LFTs are stable Has OP  No falls  Pt still in denial and no plans to quit  Recommend B complex vit MCV is large

## 2012-09-27 NOTE — Assessment & Plan Note (Signed)
bp is stable today  No cp or palpitations or headaches or edema  No side effects to medicines  BP Readings from Last 3 Encounters:  09/27/12 132/86  03/29/12 138/52  09/27/11 122/60     Labs rev

## 2012-09-27 NOTE — Assessment & Plan Note (Signed)
a1c is fine  Lab Results  Component Value Date   HGBA1C 5.5 09/20/2012   does not seem to be a problem Counseled re: alcohol abuse

## 2012-09-27 NOTE — Assessment & Plan Note (Signed)
Reviewed health habits including diet and exercise and skin cancer prevention Also reviewed health mt list, fam hx and immunizations  See HPI  Counseled on alcohol use Declines further dexa or colonos

## 2012-09-27 NOTE — Assessment & Plan Note (Signed)
Pt done with fosamax Has hx of compression fracture  D level good on suppl 75 Pt declines further dexa or med at this time

## 2012-09-27 NOTE — Progress Notes (Signed)
Subjective:    Patient ID: Elijah Nixon, male    DOB: 1933-02-18, 79 y.o.   MRN: [id number]  HPI I have personally reviewed the Medicare Annual Wellness questionnaire and have noted 1. The patient's medical and social history 2. Their use of alcohol, tobacco or illicit drugs 3. Their current medications and supplements 4. The patient's functional ability including ADL's, fall risks, home safety risks and hearing or visual             impairment. 5. Diet and physical activities 6. Evidence for depression or mood disorders  Is moving within his complex this summer  Is feeling fairly well overall for his age  Saw ortho about L knee - has adv OA , given shot of cortisone -- and it helped quite a bit (will eventually need a replacement)   The patients weight, height, BMI have been recorded in the chart and visual acuity is per eye clinic.  I have made referrals, counseling and provided education to the patient based review of the above and I have provided the pt with a written personalized care plan for preventive services.  See scanned forms.  Routine anticipatory guidance given to patient.  See health maintenance. Flu - had in 10/12  Shingles imm 2008 PNA 1/06 vaccine  Tetanus shot 1/10 Colon 11/09 with polyp- due this nov for recall- he does not want to do it and probably will not  No prostate problems , no urination issues /nocturia is at his baseline  Advance directive- has living will / will double check that it is notarized  Cognitive function addressed- see scanned forms- and if abnormal then additional documentation follows.  No concerns - no cognitive changes /no confusion   Falls- none at all   Mood - in general is ok - no depression but does have his ups and downs in general - stays motivated   Wt is down 7 lb with bmi of 25  PMH and SH reviewed  Meds, vitals, and allergies reviewed.   ROS: See HPI.  Otherwise negative.    OP-was on fosamax in past  Pain from  fx vert  dexa 12/11 stable at the time - he is unsure if he wants to do it  Takes ca and D D level is 75  bp is stable today  No cp or palpitations or headaches or edema  No side effects to medicines  BP Readings from Last 3 Encounters:  09/27/12 132/86  03/29/12 138/52  09/27/11 122/60     Lab Results  Component Value Date   CHOL 224* 09/20/2012   CHOL 214* 03/22/2012   CHOL 212* 03/20/2011   Lab Results  Component Value Date   HDL 107.80 09/20/2012   HDL 409.81 03/22/2012   HDL 106.70 03/20/2011   Lab Results  Component Value Date   LDLCALC 75 10/27/2008   Lab Results  Component Value Date   TRIG 129.0 09/20/2012   TRIG 154.0* 03/22/2012   TRIG 72.0 03/20/2011   Lab Results  Component Value Date   CHOLHDL 2 09/20/2012   CHOLHDL 2 03/22/2012   CHOLHDL 2 03/20/2011   Lab Results  Component Value Date   LDLDIRECT 88.2 09/20/2012   LDLDIRECT 86.4 03/22/2012   LDLDIRECT 85.0 03/20/2011   overall - a good profile   Alcohol intake - about the same - average 8 beers per day (self reported)  No problems from this  No social issues or problems from drinking  No withdrawal symptoms  and no blackouts at all  He does not plan to quit   Patient Active Problem List   Diagnosis Date Noted  . Encounter for Medicare annual wellness exam 09/27/2012  . Seborrheic keratosis 03/29/2012  . Stress reaction 09/21/2010  . HYPERGLYCEMIA 03/23/2010  . CERUMEN IMPACTION, BILATERAL 12/08/2009  . SWELLING, MASS, OR LUMP IN CHEST 06/24/2009  . ERECTILE DYSFUNCTION 02/16/2009  . ACTINIC KERATOSIS 02/16/2009  . ADVERSE DRUG REACTION 11/13/2008  . ALCOHOL ABUSE 10/27/2008  . HYPERTENSION 10/27/2008  . GEN OSTEOARTHROSIS INVOLVING MULTIPLE SITES 10/27/2008  . OSTEOPOROSIS 10/27/2008  . COLONIC POLYPS, HX OF 10/27/2008  . DIVERTICULITIS, HX OF 10/27/2008   Past Medical History  Diagnosis Date  . History of diverticulitis of colon   . Hypertension   . Arthritis     OA  . History of colon polyps    . DDD (degenerative disc disease)   . Osteoporosis     spinal compression fx  . Macular degeneration     dry  . AA (alcohol abuse)     at least 8-10 drinks/day  . ED (erectile dysfunction)    Past Surgical History  Procedure Laterality Date  . Spine surgery  1971    deg disc disease x 2   History  Substance Use Topics  . Smoking status: Never Smoker   . Smokeless tobacco: Never Used  . Alcohol Use: Yes     Comment: 7-8 beers per day   Family History  Problem Relation Age of Onset  . Diabetes Son    Allergies  Allergen Reactions  . Lisinopril     REACTION: increased potassium leveles   Current Outpatient Prescriptions on File Prior to Visit  Medication Sig Dispense Refill  . amLODipine (NORVASC) 10 MG tablet Take 1 tablet (10 mg total) by mouth daily.  90 tablet  3  . aspirin 81 MG tablet Take 81 mg by mouth daily.        . Calcium Carbonate-Vit D-Min 600-400 MG-UNIT TABS Take 1 tablet by mouth daily.       Marland Kitchen doxazosin (CARDURA) 8 MG tablet Take 1 tablet (8 mg total) by mouth daily.  90 tablet  3  . folic acid (FOLVITE) 400 MCG tablet Take 400 mcg by mouth daily.      . Multiple Vitamin (MULTIVITAMIN) capsule Take 1 capsule by mouth daily.        . Multiple Vitamins-Minerals (PRESERVISION/LUTEIN) CAPS Take 1 capsule by mouth daily.        Marland Kitchen spironolactone (ALDACTONE) 25 MG tablet Take 1 tablet (25 mg total) by mouth daily.  90 tablet  3   No current facility-administered medications on file prior to visit.     Review of Systems Review of Systems  Constitutional: Negative for fever, appetite change, fatigue and unexpected weight change.  Eyes: Negative for pain and visual disturbance.  Respiratory: Negative for cough and shortness of breath.   Cardiovascular: Negative for cp or palpitations    Gastrointestinal: Negative for nausea, diarrhea and constipation.  Genitourinary: Negative for urgency and frequency.  Skin: Negative for pallor or rash   Neurological:  Negative for weakness, light-headedness, numbness and headaches. pos for tremor  MSK pos for knee pain  Hematological: Negative for adenopathy. Does not bruise/bleed easily.  Psychiatric/Behavioral: Negative for dysphoric mood. The patient is not nervous/anxious.         Objective:   Physical Exam  Constitutional: He appears well-developed and well-nourished. No distress.  Frail appearing elderly male  HENT:  Head: Normocephalic and atraumatic.  Right Ear: External ear normal.  Left Ear: External ear normal.  Nose: Nose normal.  Mouth/Throat: Oropharynx is clear and moist.  Eyes: Conjunctivae and EOM are normal. Pupils are equal, round, and reactive to light. Right eye exhibits no discharge. Left eye exhibits no discharge. No scleral icterus.  Neck: Normal range of motion. Neck supple. No JVD present. Carotid bruit is not present. No thyromegaly present.  Cardiovascular: Normal rate, regular rhythm, normal heart sounds and intact distal pulses.  Exam reveals no gallop.   Pulmonary/Chest: Effort normal and breath sounds normal. No respiratory distress. He has no wheezes. He exhibits no tenderness.  Abdominal: Soft. Bowel sounds are normal. He exhibits no distension, no abdominal bruit and no mass. There is no tenderness.  Musculoskeletal: He exhibits no edema and no tenderness.  Lymphadenopathy:    He has no cervical adenopathy.  Neurological: He is alert. He has normal reflexes. He displays tremor. No cranial nerve deficit. He exhibits normal muscle tone. Coordination normal.  Skin: Skin is warm and dry. No rash noted. No erythema. No pallor.  Solar aging and SKs  Psychiatric: He has a normal mood and affect.          Assessment & Plan:

## 2012-09-27 NOTE — Patient Instructions (Addendum)
Please fill out your medicare forms and return them to [place]  If you get a reminder card about colonoscopy this fall - call the GI office and let them know that you do not want to do another one  If you change your mind about getting another bone density test let us knoe  Get a B complex vitamin and take one daily ( alcohol can cause deficiencies in this)  Try your best to cut down and eventually quit alcohol as it will affect your health Stay active physically and mentally

## 2013-03-17 ENCOUNTER — Encounter: Payer: Self-pay | Admitting: Internal Medicine

## 2013-03-18 ENCOUNTER — Encounter: Payer: Self-pay | Admitting: Family Medicine

## 2013-03-18 ENCOUNTER — Ambulatory Visit (INDEPENDENT_AMBULATORY_CARE_PROVIDER_SITE_OTHER): Payer: Medicare PPO | Admitting: Family Medicine

## 2013-03-18 VITALS — BP 132/70 | HR 76 | Temp 97.7°F | Ht 67.0 in | Wt 156.8 lb

## 2013-03-18 DIAGNOSIS — F1010 Alcohol abuse, uncomplicated: Secondary | ICD-10-CM

## 2013-03-18 DIAGNOSIS — R42 Dizziness and giddiness: Secondary | ICD-10-CM | POA: Insufficient documentation

## 2013-03-18 LAB — COMPREHENSIVE METABOLIC PANEL
Albumin: 4.2 g/dL (ref 3.5–5.2)
BUN: 11 mg/dL (ref 6–23)
CO2: 26 mEq/L (ref 19–32)
GFR: 82.44 mL/min (ref >60.00)
Glucose, Bld: 106 mg/dL — ABNORMAL HIGH (ref 70–99)
Sodium: 133 mEq/L — ABNORMAL LOW (ref 135–145)
Total Bilirubin: 0.7 mg/dL (ref 0.3–1.2)
Total Protein: 7.6 g/dL (ref 6.0–8.3)

## 2013-03-18 LAB — CBC WITH DIFFERENTIAL/PLATELET
Eosinophils Relative: 0.5 % (ref 0.0–5.0)
HCT: 43 % (ref 39.0–52.0)
Monocytes Relative: 9.3 % (ref 3.0–12.0)
Neutrophils Relative %: 73.5 % (ref 43.0–77.0)
Platelets: 283 10*3/uL (ref 150.0–400.0)
WBC: 7.2 10*3/uL (ref 4.5–10.5)

## 2013-03-18 LAB — VITAMIN B12: Vitamin B-12: 315 pg/mL (ref 211–911)

## 2013-03-18 NOTE — Patient Instructions (Signed)
Labs today and we will update you with results  Cut alcohol as much as you can  Add more water -drink at least 8 glasses a day- dehydration can cause dizziness If worse- let me know or if other new symptoms

## 2013-03-18 NOTE — Progress Notes (Signed)
Subjective:    Patient ID: Elijah Nixon, male    DOB: [birth year], 79 y.o.   MRN: [id number]  HPI Here for complaint for dizziness  It is only at night  This occurs when he gets up in the middle of night to urinate and again in the am  No falls  Feels off balance but not spinning  Not dizzy to turn over in bed He tries to sit and stand slowly  No ha or other symptoms   He drinks 7-8 drinks per day at least- trying to cut back but does not know if he can  Definitely does not drink enough water   Patient Active Problem List   Diagnosis Date Noted  . Dizzy 03/18/2013  . Encounter for Medicare annual wellness exam 09/27/2012  . Seborrheic keratosis 03/29/2012  . Stress reaction 09/21/2010  . HYPERGLYCEMIA 03/23/2010  . CERUMEN IMPACTION, BILATERAL 12/08/2009  . SWELLING, MASS, OR LUMP IN CHEST 06/24/2009  . ERECTILE DYSFUNCTION 02/16/2009  . ACTINIC KERATOSIS 02/16/2009  . ADVERSE DRUG REACTION 11/13/2008  . ALCOHOL ABUSE 10/27/2008  . HYPERTENSION 10/27/2008  . GEN OSTEOARTHROSIS INVOLVING MULTIPLE SITES 10/27/2008  . OSTEOPOROSIS 10/27/2008  . COLONIC POLYPS, HX OF 10/27/2008  . DIVERTICULITIS, HX OF 10/27/2008   Past Medical History  Diagnosis Date  . History of diverticulitis of colon   . Hypertension   . Arthritis     OA  . History of colon polyps   . DDD (degenerative disc disease)   . Osteoporosis     spinal compression fx  . Macular degeneration     dry  . AA (alcohol abuse)     at least 8-10 drinks/day  . ED (erectile dysfunction)    Past Surgical History  Procedure Laterality Date  . Spine surgery  1971    deg disc disease x 2   History  Substance Use Topics  . Smoking status: Never Smoker   . Smokeless tobacco: Never Used  . Alcohol Use: Yes     Comment: 7-8 beers per day   Family History  Problem Relation Age of Onset  . Diabetes Son    Allergies  Allergen Reactions  . Lisinopril     REACTION: increased potassium leveles   Current  Outpatient Prescriptions on File Prior to Visit  Medication Sig Dispense Refill  . amLODipine (NORVASC) 10 MG tablet Take 1 tablet (10 mg total) by mouth daily.  90 tablet  3  . aspirin 81 MG tablet Take 81 mg by mouth daily.        . Calcium Carbonate-Vit D-Min 600-400 MG-UNIT TABS Take 1 tablet by mouth daily.       Marland Kitchen doxazosin (CARDURA) 8 MG tablet Take 1 tablet (8 mg total) by mouth daily.  90 tablet  3  . folic acid (FOLVITE) 400 MCG tablet Take 400 mcg by mouth daily.      Marland Kitchen ibuprofen (ADVIL,MOTRIN) 200 MG tablet Take 600 mg by mouth as needed for pain.      . Multiple Vitamin (MULTIVITAMIN) capsule Take 1 capsule by mouth daily.        . Multiple Vitamins-Minerals (PRESERVISION/LUTEIN) CAPS Take 1 capsule by mouth daily.        Marland Kitchen spironolactone (ALDACTONE) 25 MG tablet Take 1 tablet (25 mg total) by mouth daily.  90 tablet  3   No current facility-administered medications on file prior to visit.    Review of Systems Review of Systems  Constitutional: Negative for fever,  appetite change, fatigue and unexpected weight change.  Eyes: Negative for pain and visual disturbance.  Respiratory: Negative for cough and shortness of breath.   Cardiovascular: Negative for cp or palpitations    Gastrointestinal: Negative for nausea, diarrhea and constipation.  Genitourinary: Negative for urgency and frequency.  Skin: Negative for pallor or rash   Neurological: Negative for weakness, numbness and headaches.neg for slurred speech or facial droop  Hematological: Negative for adenopathy. Does not bruise/bleed easily.  Psychiatric/Behavioral: Negative for dysphoric mood. The patient is not nervous/anxious.         Objective:   Physical Exam  Constitutional: He appears well-developed and well-nourished. No distress.  HENT:  Head: Normocephalic and atraumatic.  Right Ear: External ear normal.  Left Ear: External ear normal.  Nose: Nose normal.  Mouth/Throat: Oropharynx is clear and moist.    Eyes: Conjunctivae and EOM are normal. Pupils are equal, round, and reactive to light. No scleral icterus.  No nystagmus   Neck: Normal range of motion. Neck supple. No thyromegaly present.  Cardiovascular: Normal rate, regular rhythm and intact distal pulses.  Exam reveals no gallop.   Pulmonary/Chest: Effort normal and breath sounds normal. No respiratory distress. He has no wheezes. He has no rales.  Abdominal: Soft. Bowel sounds are normal. He exhibits no distension and no mass. There is no tenderness.  Musculoskeletal: He exhibits no edema.  Lymphadenopathy:    He has no cervical adenopathy.  Neurological: He is alert. He has normal reflexes. He displays tremor. He displays no atrophy. No cranial nerve deficit or sensory deficit. He exhibits normal muscle tone. He displays a negative Romberg sign. Coordination and gait normal.  Mild hand tremor bilaterally Gait is slow and steady Nl rhombert  Skin: Skin is warm and dry. No rash noted. No erythema. No pallor.  Psychiatric: His speech is normal and behavior is normal. His mood appears anxious. His affect is not blunt and not labile. Thought content is not paranoid. He expresses no homicidal and no suicidal ideation.          Assessment & Plan:

## 2013-03-18 NOTE — Progress Notes (Signed)
Pre-visit discussion using our clinic review tool. No additional management support is needed unless otherwise documented below in the visit note.  

## 2013-03-19 NOTE — Assessment & Plan Note (Addendum)
Per hx and exam-not vertiginous  Also no head trauma/ ha or stroke symptoms Suspect alcohol intake plays a role as well as some relative dehydration Disc imp of 8 servings of non alcohol fluids per day Lab today Counseled on alcohol cessation  Disc changing pos slowly inst if suddenly worse or any new symptoms after hours to go to ER-he voiced understanding

## 2013-03-19 NOTE — Assessment & Plan Note (Signed)
Again disc need to quit alcohol for better physical and emotional health Pt admittedly not ready to quit This may be the cause of pm dizziness since this is when he drinks  Disc poss of dehydration since this is almost his only source of fluids  Confirmed he is taking his supplements

## 2013-03-20 ENCOUNTER — Telehealth: Payer: Self-pay

## 2013-03-20 NOTE — Telephone Encounter (Signed)
Pt said got call about 03/18/13 lab results but was in store and request results again to write them down. Patient notified as instructed by 03/18/13 result note. Pt voiced understanding and pt will cb with update on how pt is doing.

## 2013-04-03 ENCOUNTER — Other Ambulatory Visit: Payer: Self-pay | Admitting: Family Medicine

## 2013-06-13 ENCOUNTER — Other Ambulatory Visit: Payer: Self-pay | Admitting: Family Medicine

## 2013-07-01 ENCOUNTER — Other Ambulatory Visit: Payer: Self-pay | Admitting: Family Medicine

## 2013-08-04 ENCOUNTER — Encounter: Payer: Self-pay | Admitting: Internal Medicine

## 2013-09-21 ENCOUNTER — Telehealth: Payer: Self-pay | Admitting: Family Medicine

## 2013-09-21 DIAGNOSIS — R7309 Other abnormal glucose: Secondary | ICD-10-CM

## 2013-09-21 DIAGNOSIS — M810 Age-related osteoporosis without current pathological fracture: Secondary | ICD-10-CM

## 2013-09-21 DIAGNOSIS — I10 Essential (primary) hypertension: Secondary | ICD-10-CM

## 2013-09-21 NOTE — Telephone Encounter (Signed)
Message copied by Judy Pimple on Sun Sep 21, 2013  1:49 PM ------      Message from: Baldomero Lamy      Created: Mon Sep 15, 2013 10:25 AM      Regarding: Cpx labs 09/22/13       Please order  future cpx labs for pt's upcoming lab appt.      Thanks      Tasha       ------

## 2013-09-22 ENCOUNTER — Other Ambulatory Visit (INDEPENDENT_AMBULATORY_CARE_PROVIDER_SITE_OTHER): Payer: Medicare PPO

## 2013-09-22 DIAGNOSIS — I10 Essential (primary) hypertension: Secondary | ICD-10-CM

## 2013-09-22 DIAGNOSIS — M810 Age-related osteoporosis without current pathological fracture: Secondary | ICD-10-CM

## 2013-09-22 DIAGNOSIS — R7309 Other abnormal glucose: Secondary | ICD-10-CM

## 2013-09-22 LAB — CBC WITH DIFFERENTIAL/PLATELET
BASOS PCT: 0.8 % (ref 0.0–3.0)
Basophils Absolute: 0 10*3/uL (ref 0.0–0.1)
EOS ABS: 0.3 10*3/uL (ref 0.0–0.7)
Eosinophils Relative: 5.1 % — ABNORMAL HIGH (ref 0.0–5.0)
HCT: 42.7 % (ref 39.0–52.0)
HEMOGLOBIN: 14.6 g/dL (ref 13.0–17.0)
Lymphocytes Relative: 34.3 % (ref 12.0–46.0)
Lymphs Abs: 2.1 10*3/uL (ref 0.7–4.0)
MCHC: 34.3 g/dL (ref 30.0–36.0)
MCV: 102.5 fl — AB (ref 78.0–100.0)
MONO ABS: 0.6 10*3/uL (ref 0.1–1.0)
Monocytes Relative: 9 % (ref 3.0–12.0)
NEUTROS ABS: 3.2 10*3/uL (ref 1.4–7.7)
Neutrophils Relative %: 50.8 % (ref 43.0–77.0)
Platelets: 311 10*3/uL (ref 150.0–400.0)
RBC: 4.17 Mil/uL — AB (ref 4.22–5.81)
RDW: 12.9 % (ref 11.5–15.5)
WBC: 6.2 10*3/uL (ref 4.0–10.5)

## 2013-09-22 LAB — LIPID PANEL
Cholesterol: 210 mg/dL — ABNORMAL HIGH (ref 0–200)
HDL: 98.6 mg/dL (ref >39.00)
LDL CALC: 88 mg/dL (ref 0–99)
NonHDL: 111.4
TRIGLYCERIDES: 117 mg/dL (ref 0.0–149.0)
Total CHOL/HDL Ratio: 2
VLDL: 23.4 mg/dL (ref 0.0–40.0)

## 2013-09-22 LAB — COMPREHENSIVE METABOLIC PANEL
ALK PHOS: 48 U/L (ref 39–117)
ALT: 21 U/L (ref 0–53)
AST: 25 U/L (ref 0–37)
Albumin: 4.2 g/dL (ref 3.5–5.2)
BUN: 9 mg/dL (ref 6–23)
CO2: 26 mEq/L (ref 19–32)
CREATININE: 0.8 mg/dL (ref 0.4–1.5)
Calcium: 9.3 mg/dL (ref 8.4–10.5)
Chloride: 101 mEq/L (ref 96–112)
GFR: 95.05 mL/min (ref >60.00)
Glucose, Bld: 95 mg/dL (ref 70–99)
Potassium: 4.2 mEq/L (ref 3.5–5.1)
Sodium: 136 mEq/L (ref 135–145)
Total Bilirubin: 0.8 mg/dL (ref 0.2–1.2)
Total Protein: 7.3 g/dL (ref 6.0–8.3)

## 2013-09-22 LAB — TSH: TSH: 1.28 u[IU]/mL (ref 0.35–4.50)

## 2013-09-22 LAB — VITAMIN D 25 HYDROXY (VIT D DEFICIENCY, FRACTURES): VITD: 83.49 ng/mL

## 2013-09-22 LAB — HEMOGLOBIN A1C: Hgb A1c MFr Bld: 5.5 % (ref 4.6–6.5)

## 2013-09-23 ENCOUNTER — Other Ambulatory Visit: Payer: Self-pay | Admitting: Family Medicine

## 2013-09-29 ENCOUNTER — Ambulatory Visit (INDEPENDENT_AMBULATORY_CARE_PROVIDER_SITE_OTHER): Payer: Medicare PPO | Admitting: Family Medicine

## 2013-09-29 ENCOUNTER — Encounter: Payer: Self-pay | Admitting: Family Medicine

## 2013-09-29 VITALS — BP 128/68 | HR 85 | Temp 97.8°F | Ht 67.0 in | Wt 156.8 lb

## 2013-09-29 DIAGNOSIS — Z1211 Encounter for screening for malignant neoplasm of colon: Secondary | ICD-10-CM | POA: Insufficient documentation

## 2013-09-29 DIAGNOSIS — Z860100 Personal history of colon polyps, unspecified: Secondary | ICD-10-CM

## 2013-09-29 DIAGNOSIS — F1010 Alcohol abuse, uncomplicated: Secondary | ICD-10-CM

## 2013-09-29 DIAGNOSIS — R7309 Other abnormal glucose: Secondary | ICD-10-CM

## 2013-09-29 DIAGNOSIS — Z23 Encounter for immunization: Secondary | ICD-10-CM

## 2013-09-29 DIAGNOSIS — Z0000 Encounter for general adult medical examination without abnormal findings: Secondary | ICD-10-CM

## 2013-09-29 DIAGNOSIS — I10 Essential (primary) hypertension: Secondary | ICD-10-CM

## 2013-09-29 MED ORDER — DOXAZOSIN MESYLATE 8 MG PO TABS
ORAL_TABLET | ORAL | Status: DC
Start: 2013-09-29 — End: 2014-10-02

## 2013-09-29 MED ORDER — SPIRONOLACTONE 25 MG PO TABS: ORAL_TABLET | ORAL | Status: DC

## 2013-09-29 MED ORDER — AMLODIPINE BESYLATE 10 MG PO TABS: ORAL_TABLET | ORAL | Status: DC

## 2013-09-29 NOTE — Progress Notes (Signed)
Pre visit review using our clinic review tool, if applicable. No additional management support is needed unless otherwise documented below in the visit note. 

## 2013-09-29 NOTE — Progress Notes (Signed)
Subjective:    Patient ID: Elijah RasmussenBilly W Boch, male    DOB: 1932/07/26, 80 y.o.   MRN: [id number]  HPI I have personally reviewed the Medicare Annual Wellness questionnaire and have noted 1. The patient's medical and social history 2. Their use of alcohol, tobacco or illicit drugs 3. Their current medications and supplements 4. The patient's functional ability including ADL's, fall risks, home safety risks and hearing or visual             impairment. 5. Diet and physical activities 6. Evidence for depression or mood disorders  The patients weight, height, BMI have been recorded in the chart and visual acuity is per eye clinic.  I have made referrals, counseling and provided education to the patient based review of the above and I have provided the pt with a written personalized care plan for preventive services.  Doing well overall  [place]  Taking B12 otc and also a B complex vitamin  Eats a healthy diet  Stays fairly active    See scanned forms.  Routine anticipatory guidance given to patient.  See health maintenance. Colon cancer screening 11/09 = recall of 5 years- given age does not want to do it - but he will do an ifob Flu vaccine in fall  Tetanus vaccine 1/10 Pneumovax 1/06 - will do prevnar today  Zoster vaccine 4/05 Prostate cancer screening  Lab Results  Component Value Date   PSA 2.15 09/19/2010   PSA 2.63 06/21/2009   is over age to screen now  No symptoms No fam hx of prostate cancer  Advance directive- he does not have a living will -- given packet to look at  Cognitive function addressed- see scanned forms- and if abnormal then additional documentation follows. - pt reports no major memory problems   PMH and SH reviewed  Meds, vitals, and allergies reviewed.   ROS: See HPI.  Otherwise negative.    bp is stable today  No cp or palpitations or headaches or edema  No side effects to medicines  BP Readings from  Last 3 Encounters:  09/29/13 128/68  03/18/13 132/70  09/27/12 132/86     Alcohol intake - is unchanged , 6-8 drinks per day- he know this is too much but has no desire to quit or cut down  Sees no problem with it  No hx of DT s in the past    Results for orders placed in visit on 09/22/13  TSH      Result Value Ref Range   TSH 1.28  0.35 - 4.50 uIU/mL  VITAMIN D 25 HYDROXY      Result Value Ref Range   VITD 83.49    CBC WITH DIFFERENTIAL      Result Value Ref Range   WBC 6.2  4.0 - 10.5 K/uL   RBC 4.17 (*) 4.22 - 5.81 Mil/uL   Hemoglobin 14.6  13.0 - 17.0 g/dL   HCT 44.042.7  10.239.0 - 72.552.0 %   MCV 102.5 (*) 78.0 - 100.0 fl   MCHC 34.3  30.0 - 36.0 g/dL   RDW 36.612.9  44.011.5 - 34.715.5 %   Platelets 311.0  150.0 - 400.0 K/uL   Neutrophils Relative % 50.8  43.0 - 77.0 %   Lymphocytes Relative 34.3  12.0 - 46.0 %   Monocytes Relative 9.0  3.0 - 12.0 %   Eosinophils Relative 5.1 (*) 0.0 - 5.0 %  Basophils Relative 0.8  0.0 - 3.0 %   Neutro Abs 3.2  1.4 - 7.7 K/uL   Lymphs Abs 2.1  0.7 - 4.0 K/uL   Monocytes Absolute 0.6  0.1 - 1.0 K/uL   Eosinophils Absolute 0.3  0.0 - 0.7 K/uL   Basophils Absolute 0.0  0.0 - 0.1 K/uL  COMPREHENSIVE METABOLIC PANEL      Result Value Ref Range   Sodium 136  135 - 145 mEq/L   Potassium 4.2  3.5 - 5.1 mEq/L   Chloride 101  96 - 112 mEq/L   CO2 26  19 - 32 mEq/L   Glucose, Bld 95  70 - 99 mg/dL   BUN 9  6 - 23 mg/dL   Creatinine, Ser 0.8  0.4 - 1.5 mg/dL   Total Bilirubin 0.8  0.2 - 1.2 mg/dL   Alkaline Phosphatase 48  39 - 117 U/L   AST 25  0 - 37 U/L   ALT 21  0 - 53 U/L   Total Protein 7.3  6.0 - 8.3 g/dL   Albumin 4.2  3.5 - 5.2 g/dL   Calcium 9.3  8.4 - 09.8 mg/dL   GFR 11.91  >47.82 mL/min  HEMOGLOBIN A1C      Result Value Ref Range   Hemoglobin A1C 5.5  4.6 - 6.5 %  LIPID PANEL      Result Value Ref Range   Cholesterol 210 (*) 0 - 200 mg/dL   Triglycerides 956.2  0.0 - 149.0 mg/dL   HDL 13.08  >65.78 mg/dL   VLDL 46.9  0.0 - 62.9 mg/dL    LDL Cholesterol 88  0 - 99 mg/dL   Total CHOL/HDL Ratio 2     NonHDL 111.40     overall good chol profile   Patient Active Problem List   Diagnosis Date Noted  . Dizzy 03/18/2013  . Encounter for Medicare annual wellness exam 09/27/2012  . Seborrheic keratosis 03/29/2012  . Stress reaction 09/21/2010  . HYPERGLYCEMIA 03/23/2010  . CERUMEN IMPACTION, BILATERAL 12/08/2009  . SWELLING, MASS, OR LUMP IN CHEST 06/24/2009  . ERECTILE DYSFUNCTION 02/16/2009  . ACTINIC KERATOSIS 02/16/2009  . ADVERSE DRUG REACTION 11/13/2008  . ALCOHOL ABUSE 10/27/2008  . HYPERTENSION 10/27/2008  . GEN OSTEOARTHROSIS INVOLVING MULTIPLE SITES 10/27/2008  . OSTEOPOROSIS 10/27/2008  . COLONIC POLYPS, HX OF 10/27/2008  . DIVERTICULITIS, HX OF 10/27/2008   Past Medical History  Diagnosis Date  . History of diverticulitis of colon   . Hypertension   . Arthritis     OA  . History of colon polyps   . DDD (degenerative disc disease)   . Osteoporosis     spinal compression fx  . Macular degeneration     dry  . AA (alcohol abuse)     at least 8-10 drinks/day  . ED (erectile dysfunction)    Past Surgical History  Procedure Laterality Date  . Spine surgery  1971    deg disc disease x 2   History  Substance Use Topics  . Smoking status: Never Smoker   . Smokeless tobacco: Never Used  . Alcohol Use: Yes     Comment: 7-8 beers per day   Family History  Problem Relation Age of Onset  . Diabetes Son    Allergies  Allergen Reactions  . Lisinopril     REACTION: increased potassium leveles   Current Outpatient Prescriptions on File Prior to Visit  Medication Sig Dispense Refill  . amLODipine (NORVASC) 10  MG tablet TAKE 1 TABLET BY MOUTH DAILY  90 tablet  0  . aspirin 81 MG tablet Take 81 mg by mouth daily.        . Calcium Carbonate-Vit D-Min 600-400 MG-UNIT TABS Take 1 tablet by mouth daily.       Marland Kitchen. doxazosin (CARDURA) 8 MG tablet TAKE ONE (1) TABLET BY MOUTH EVERY DAY  90 tablet  1  .  Multiple Vitamin (MULTIVITAMIN) capsule Take 1 capsule by mouth daily.        . Multiple Vitamins-Minerals (PRESERVISION/LUTEIN) CAPS Take 1 capsule by mouth daily.        Marland Kitchen. spironolactone (ALDACTONE) 25 MG tablet TAKE ONE (1) TABLET BY MOUTH EVERY DAY  90 tablet  1   No current facility-administered medications on file prior to visit.      Review of Systems    Review of Systems  Constitutional: Negative for fever, appetite change, fatigue and unexpected weight change.  Eyes: Negative for pain and visual disturbance.  Respiratory: Negative for cough and shortness of breath.   Cardiovascular: Negative for cp or palpitations    Gastrointestinal: Negative for nausea, diarrhea and constipation.  Genitourinary: Negative for urgency and frequency.  Skin: Negative for pallor or rash   Neurological: Negative for weakness, light-headedness, numbness and headaches. pos for tremor  Hematological: Negative for adenopathy. Does not bruise/bleed easily.  Psychiatric/Behavioral: Negative for dysphoric mood. The patient is not nervous/anxious.  Pos for stressors     Objective:   Physical Exam  Constitutional: He is oriented to person, place, and time. He appears well-developed and well-nourished. No distress.  HENT:  Head: Normocephalic and atraumatic.  Right Ear: External ear normal.  Left Ear: External ear normal.  Nose: Nose normal.  Mouth/Throat: Oropharynx is clear and moist.  Eyes: Conjunctivae and EOM are normal. Pupils are equal, round, and reactive to light. Right eye exhibits no discharge. Left eye exhibits no discharge. No scleral icterus.  Neck: Normal range of motion. Neck supple. No JVD present. Carotid bruit is not present. No thyromegaly present.  Cardiovascular: Normal rate, regular rhythm, normal heart sounds and intact distal pulses.  Exam reveals no gallop.   Pulmonary/Chest: Effort normal and breath sounds normal. No respiratory distress. He has no wheezes. He has no rales.    Abdominal: Soft. Bowel sounds are normal. He exhibits no distension, no abdominal bruit and no mass. There is no tenderness.  Musculoskeletal: Normal range of motion. He exhibits no edema and no tenderness.  Lymphadenopathy:    He has no cervical adenopathy.  Neurological: He is alert and oriented to person, place, and time. He has normal reflexes. No cranial nerve deficit. He exhibits normal muscle tone. Coordination normal.  Hand tremor bilat -not flapping in nature   Skin: Skin is warm and dry. No rash noted. No erythema. No pallor.  Many sks  Solar lentigos diffusely and solar aging   Psychiatric: He has a normal mood and affect.          Assessment & Plan:   Problem List Items Addressed This Visit     Cardiovascular and Mediastinum   HYPERTENSION      bp in fair control at this time  BP Readings from Last 1 Encounters:  09/29/13 128/68   No changes needed Disc lifstyle change with low sodium diet and exercise  Lab rev today    Relevant Medications      amLODIpine (NORVASC) tablet      doxazosin (CARDURA) tablet  spironolactone (ALDACTONE) tablet     Other   ALCOHOL ABUSE     6-8 drinks per day by self report  Pt denies a problem with it  LFTs stable  Some tremor  Urged to cut down or quit- he had no desire to do so despite the health risks we disc (incl falls)    COLONIC POLYPS, HX OF     Declines colonosc due to age Will do IFOB    Encounter for Medicare annual wellness exam - Primary     Reviewed health habits including diet and exercise and skin cancer prevention Reviewed appropriate screening tests for age  Also reviewed health mt list, fam hx and immunization status , as well as social and family history   See HPI prevnar vaccine today Lab rev      Colon cancer screening     ifob today Declines colonosc due to age  Hx of polyps     Relevant Orders      Fecal occult blood, imunochemical    Other Visit Diagnoses   HYPERGLYCEMIA         Need for vaccination with 13-polyvalent pneumococcal conjugate vaccine        Relevant Orders       Pneumococcal conjugate vaccine 13-valent (Completed)

## 2013-09-29 NOTE — Assessment & Plan Note (Signed)
Reviewed health habits including diet and exercise and skin cancer prevention Reviewed appropriate screening tests for age  Also reviewed health mt list, fam hx and immunization status , as well as social and family history   See HPI prevnar vaccine today Lab rev 

## 2013-09-29 NOTE — Assessment & Plan Note (Signed)
ifob today Declines colonosc due to age  Hx of polyps

## 2013-09-29 NOTE — Assessment & Plan Note (Signed)
Stable Lab Results  Component Value Date   HGBA1C 5.5 09/22/2013

## 2013-09-29 NOTE — Assessment & Plan Note (Signed)
bp in fair control at this time  BP Readings from Last 1 Encounters:  09/29/13 128/68   No changes needed Disc lifstyle change with low sodium diet and exercise  Lab rev today

## 2013-09-29 NOTE — Assessment & Plan Note (Signed)
Declines colonosc due to age Will do IFOB

## 2013-09-29 NOTE — Patient Instructions (Signed)
prevnar vaccine today  Please do stool card for colon cancer screening  Please work on your living will  Take care of yourself  Think about reducing alcohol intake for your health

## 2013-09-29 NOTE — Assessment & Plan Note (Signed)
6-8 drinks per day by self report  Pt denies a problem with it  LFTs stable  Some tremor  Urged to cut down or quit- he had no desire to do so despite the health risks we disc (incl falls)

## 2013-09-30 ENCOUNTER — Telehealth: Payer: Self-pay | Admitting: Family Medicine

## 2013-09-30 NOTE — Telephone Encounter (Signed)
Relevant patient education assigned to patient using Emmi. ° °

## 2013-12-08 ENCOUNTER — Other Ambulatory Visit: Payer: Self-pay | Admitting: Family Medicine

## 2014-01-15 ENCOUNTER — Encounter: Payer: Self-pay | Admitting: Podiatry

## 2014-01-15 ENCOUNTER — Ambulatory Visit (INDEPENDENT_AMBULATORY_CARE_PROVIDER_SITE_OTHER): Payer: Medicare PPO | Admitting: Podiatry

## 2014-01-15 VITALS — BP 151/69 | HR 87 | Resp 16 | Ht 67.0 in | Wt 162.0 lb

## 2014-01-15 DIAGNOSIS — L600 Ingrowing nail: Secondary | ICD-10-CM

## 2014-01-15 DIAGNOSIS — B351 Tinea unguium: Secondary | ICD-10-CM
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
## 2014-01-15 NOTE — Patient Instructions (Signed)

## 2014-01-15 NOTE — Progress Notes (Signed)
   Subjective:    Patient ID: Elijah Nixon, male    DOB: 11-05-1932, 80 y.o.   MRN: [id number]  HPI Comments: "I have these toes that hurt"  Patient c/o tenderness 1st toes bilateral for several months. The nails are thick and incurvated. He just keeps them trimmed down, which helps for a short time.  Toe Pain       Review of Systems  Eyes: Positive for visual disturbance.  Musculoskeletal: Positive for back pain.  Skin:       Change in nails   All other systems reviewed and are negative.      Objective:   Physical Exam        Assessment & Plan:

## 2014-01-23 ENCOUNTER — Telehealth: Payer: Self-pay | Admitting: *Deleted

## 2014-01-23 NOTE — Telephone Encounter (Signed)
I'm a patient of Dr. Charlsie Merlesegal.  I saw him a week ago.  I been using the betadine, I run out.  He said I can also use Epsom Salt or White Vinegar but I don't know the ratio of that.  I'd like someone to tell me the ratio of those two.  Thank you very much.  I called and informed him to use 1 tablespoon per quart of water.  He stated, "Just like the betadine, that eases my mind.  Thanks for calling."

## 2014-04-08 ENCOUNTER — Encounter: Payer: Self-pay | Admitting: Podiatry

## 2014-04-08 ENCOUNTER — Ambulatory Visit (INDEPENDENT_AMBULATORY_CARE_PROVIDER_SITE_OTHER): Payer: Medicare PPO | Admitting: Podiatry

## 2014-04-08 VITALS — BP 164/81 | HR 104 | Resp 13

## 2014-04-08 DIAGNOSIS — B351 Tinea unguium: Secondary | ICD-10-CM

## 2014-04-08 DIAGNOSIS — L600 Ingrowing nail: Secondary | ICD-10-CM

## 2014-04-08 NOTE — Progress Notes (Signed)
Subjective:     Patient ID: Elijah Nixon, male   DOB: 10/11/1932, 80 y.o.   MRN: [id number]  HPI patient states I was concerned about my nailbeds being thick and on the right side and whether not I've had a regrowth of ingrown toenail   Review of Systems  All other systems reviewed and are negative.      Objective:   Physical Exam Neurovascular status intact muscle strength adequate with range of motion subtalar midtarsal joint within normal limits. Patient's noted to have thickened hallux nail right on the lateral side but it is localized in its nature    Assessment:     Possible reoccurrence ingrown versus scar tissue formation right hallux    Plan:     Debrided tissue to take pressure off of this and advised on padding therapy and reappoint if symptoms were to persist in the next several months

## 2014-09-27 ENCOUNTER — Telehealth: Payer: Self-pay | Admitting: Family Medicine

## 2014-09-27 DIAGNOSIS — I10 Essential (primary) hypertension: Secondary | ICD-10-CM

## 2014-09-27 NOTE — Telephone Encounter (Signed)
-----   Message from [name] J Walsh sent at 09/21/2014 10:43 AM EDT ----- Regarding: Lab orders for Monday, 6.13.16 Patient is scheduled for CPX labs, please order future labs, Thanks , [name]  

## 2014-09-28 ENCOUNTER — Other Ambulatory Visit (INDEPENDENT_AMBULATORY_CARE_PROVIDER_SITE_OTHER): Payer: Medicare PPO

## 2014-09-28 DIAGNOSIS — I10 Essential (primary) hypertension: Secondary | ICD-10-CM | POA: Diagnosis not present

## 2014-09-28 LAB — COMPREHENSIVE METABOLIC PANEL
ALBUMIN: 4.1 g/dL (ref 3.5–5.2)
ALT: 16 U/L (ref 0–53)
AST: 19 U/L (ref 0–37)
Alkaline Phosphatase: 57 U/L (ref 39–117)
BUN: 7 mg/dL (ref 6–23)
CHLORIDE: 98 meq/L (ref 96–112)
CO2: 25 mEq/L (ref 19–32)
Calcium: 9.5 mg/dL (ref 8.4–10.5)
Creatinine, Ser: 0.83 mg/dL (ref 0.40–1.50)
GFR: 94.8 mL/min (ref >60.00)
GLUCOSE: 103 mg/dL — AB (ref 70–99)
POTASSIUM: 4.1 meq/L (ref 3.5–5.1)
Sodium: 132 mEq/L — ABNORMAL LOW (ref 135–145)
Total Bilirubin: 0.5 mg/dL (ref 0.2–1.2)
Total Protein: 7.5 g/dL (ref 6.0–8.3)

## 2014-09-28 LAB — CBC WITH DIFFERENTIAL/PLATELET
Basophils Absolute: 0.1 10*3/uL (ref 0.0–0.1)
Basophils Relative: 0.9 % (ref 0.0–3.0)
EOS PCT: 3 % (ref 0.0–5.0)
Eosinophils Absolute: 0.2 10*3/uL (ref 0.0–0.7)
HCT: 41.6 % (ref 39.0–52.0)
HEMOGLOBIN: 14.2 g/dL (ref 13.0–17.0)
LYMPHS ABS: 1.7 10*3/uL (ref 0.7–4.0)
LYMPHS PCT: 23 % (ref 12.0–46.0)
MCHC: 34.1 g/dL (ref 30.0–36.0)
MCV: 100.8 fl — ABNORMAL HIGH (ref 78.0–100.0)
MONO ABS: 0.6 10*3/uL (ref 0.1–1.0)
Monocytes Relative: 8.9 % (ref 3.0–12.0)
NEUTROS PCT: 64.2 % (ref 43.0–77.0)
Neutro Abs: 4.6 10*3/uL (ref 1.4–7.7)
Platelets: 345 10*3/uL (ref 150.0–400.0)
RBC: 4.13 Mil/uL — ABNORMAL LOW (ref 4.22–5.81)
RDW: 13.2 % (ref 11.5–15.5)
WBC: 7.2 10*3/uL (ref 4.0–10.5)

## 2014-09-28 LAB — LIPID PANEL
CHOL/HDL RATIO: 2
Cholesterol: 198 mg/dL (ref 0–200)
HDL: 110.4 mg/dL (ref >39.00)
LDL Cholesterol: 78 mg/dL (ref 0–99)
NONHDL: 87.6
Triglycerides: 49 mg/dL (ref 0.0–149.0)
VLDL: 9.8 mg/dL (ref 0.0–40.0)

## 2014-09-28 LAB — TSH: TSH: 2.13 u[IU]/mL (ref 0.35–4.50)

## 2014-10-02 ENCOUNTER — Encounter: Payer: Self-pay | Admitting: Family Medicine

## 2014-10-02 ENCOUNTER — Ambulatory Visit (INDEPENDENT_AMBULATORY_CARE_PROVIDER_SITE_OTHER): Payer: Medicare PPO | Admitting: Family Medicine

## 2014-10-02 VITALS — BP 135/60 | HR 86 | Temp 97.6°F | Ht 66.0 in | Wt 154.5 lb

## 2014-10-02 DIAGNOSIS — Z1211 Encounter for screening for malignant neoplasm of colon: Secondary | ICD-10-CM

## 2014-10-02 DIAGNOSIS — L03012 Cellulitis of left finger: Secondary | ICD-10-CM

## 2014-10-02 DIAGNOSIS — F1010 Alcohol abuse, uncomplicated: Secondary | ICD-10-CM

## 2014-10-02 DIAGNOSIS — Z0000 Encounter for general adult medical examination without abnormal findings: Secondary | ICD-10-CM | POA: Diagnosis not present

## 2014-10-02 DIAGNOSIS — M810 Age-related osteoporosis without current pathological fracture: Secondary | ICD-10-CM

## 2014-10-02 DIAGNOSIS — I10 Essential (primary) hypertension: Secondary | ICD-10-CM | POA: Diagnosis not present

## 2014-10-02 MED ORDER — AMLODIPINE BESYLATE 10 MG PO TABS: ORAL_TABLET | ORAL | Status: DC

## 2014-10-02 MED ORDER — SPIRONOLACTONE 25 MG PO TABS: ORAL_TABLET | ORAL | Status: DC

## 2014-10-02 MED ORDER — DOXAZOSIN MESYLATE 8 MG PO TABS: ORAL_TABLET | ORAL | Status: DC

## 2014-10-02 MED ORDER — CEPHALEXIN 500 MG PO CAPS: 500.0000 mg | ORAL_CAPSULE | Freq: Three times a day (TID) | ORAL | Status: DC

## 2014-10-02 NOTE — Progress Notes (Signed)
Pre visit review using our clinic review tool, if applicable. No additional management support is needed unless otherwise documented below in the visit note. 

## 2014-10-02 NOTE — Progress Notes (Signed)
Subjective:    Patient ID: Elijah Nixon, male    DOB: [birth year], 81 y.o.   MRN: [id number]  HPI   Here for annual medicare wellness visit as well as chronic/acute medical problems  And also annual preventative exam   I have personally reviewed the Medicare Annual Wellness questionnaire and have noted 1. The patient's medical and social history 2. Their use of alcohol, tobacco or illicit drugs 3. Their current medications and supplements 4. The patient's functional ability including ADL's, fall risks, home safety risks and hearing or visual             impairment. 5. Diet and physical activities 6. Evidence for depression or mood disorders  The patients weight, height, BMI have been recorded in the chart and visual acuity is per eye clinic.  I have made referrals, counseling and provided education to the patient based review of the above and I have provided the pt with a written personalized care plan for preventive services. Reviewed and updated provider list, see scanned forms.  See scanned forms.  Routine anticipatory guidance given to patient.  See health maintenance. Colon cancer screening -due colonosc 2014 but declines  Flu vaccine- 10/15 Tetanus vaccine 1/10 Pneumovax completes series 6/15  Zoster vaccine 1/08 Prostate cancer screening  Lab Results  Component Value Date   PSA 2.15 09/19/2010   PSA 2.63 06/21/2009  stopped screen due to age  Nocturia 2 times (stable for him)   Advance directive - does not have one / started one and did not finish it -will work on one No falls  Cognitive function addressed- see scanned forms- and if abnormal then additional documentation follows.  No change in memory/ no major concerns   ETOH - about the same , 7-8 beers today, no plans or intent to change/ knows this is too much  Sodium is 132  MCV is high Pt does take oral B12 and mvi daily   PMH and SH reviewed  Meds, vitals, and allergies reviewed.   ROS: See HPI.   Otherwise negative.    Wt is down 6 lb with bmi of 24 Does not eat as much as he used to  No regular exercise program-does walk (back hurts)    bp is stable today  No cp or palpitations or headaches or edema  No side effects to medicines  BP Readings from Last 3 Encounters:  10/02/14 142/66  04/08/14 164/81  01/15/14 151/69     OP- declines dexa  No falls or fx Takes ca and D   Cholesterol Lab Results  Component Value Date   CHOL 198 09/28/2014   CHOL 210* 09/22/2013   CHOL 224* 09/20/2012   Lab Results  Component Value Date   HDL 110.40 09/28/2014   HDL 98.60 09/22/2013   HDL 107.80 09/20/2012   Lab Results  Component Value Date   LDLCALC 78 09/28/2014   LDLCALC 88 09/22/2013   LDLCALC 75 10/27/2008   Lab Results  Component Value Date   TRIG 49.0 09/28/2014   TRIG 117.0 09/22/2013   TRIG 129.0 09/20/2012   Lab Results  Component Value Date   CHOLHDL 2 09/28/2014   CHOLHDL 2 09/22/2013   CHOLHDL 2 09/20/2012   Lab Results  Component Value Date   LDLDIRECT 88.2 09/20/2012   LDLDIRECT 86.4 03/22/2012   LDLDIRECT 85.0 03/20/2011   Well controlled/stable  Eats a fairly healthy diet   Also has redness around nail of L index finger- picked at  cuticle and it bled a little  Slightly tender/no drainage  No fever or numbness  Patient Active Problem List   Diagnosis Date Noted  . Routine general medical examination at a health care facility 10/02/2014  . Colon cancer screening 09/29/2013  . Encounter for Medicare annual wellness exam 09/27/2012  . Seborrheic keratosis 03/29/2012  . Stress reaction 09/21/2010  . SWELLING, MASS, OR LUMP IN CHEST 06/24/2009  . ERECTILE DYSFUNCTION 02/16/2009  . ACTINIC KERATOSIS 02/16/2009  . ADVERSE DRUG REACTION 11/13/2008  . ALCOHOL ABUSE 10/27/2008  . Essential hypertension 10/27/2008  . GEN OSTEOARTHROSIS INVOLVING MULTIPLE SITES 10/27/2008  . Osteoporosis 10/27/2008  . COLONIC POLYPS, HX OF 10/27/2008  .  DIVERTICULITIS, HX OF 10/27/2008   Past Medical History  Diagnosis Date  . History of diverticulitis of colon   . Hypertension   . Arthritis     OA  . History of colon polyps   . DDD (degenerative disc disease)   . Osteoporosis     spinal compression fx  . Macular degeneration     dry  . AA (alcohol abuse)     at least 8-10 drinks/day  . ED (erectile dysfunction)    Past Surgical History  Procedure Laterality Date  . Spine surgery  1971    deg disc disease x 2   History  Substance Use Topics  . Smoking status: Never Smoker   . Smokeless tobacco: Never Used  . Alcohol Use: 0.0 oz/week    0 Standard drinks or equivalent per week     Comment: 7-8 beers per day   Family History  Problem Relation Age of Onset  . Diabetes Son    Allergies  Allergen Reactions  . Lisinopril     REACTION: increased potassium leveles   Current Outpatient Prescriptions on File Prior to Visit  Medication Sig Dispense Refill  . amLODipine (NORVASC) 10 MG tablet TAKE 1 TABLET BY MOUTH DAILY 90 tablet 3  . aspirin 81 MG tablet Take 81 mg by mouth daily.      Marland Kitchen b complex vitamins tablet Take 1 tablet by mouth daily.    . Calcium Carbonate-Vit D-Min 600-400 MG-UNIT TABS Take 1 tablet by mouth daily.     . Cyanocobalamin (VITAMIN B-12 PO) Take 1 tablet by mouth daily.    Marland Kitchen doxazosin (CARDURA) 8 MG tablet TAKE ONE (1) TABLET BY MOUTH EVERY DAY 90 tablet 3  . famotidine (PEPCID) 20 MG tablet Take 20 mg by mouth daily.    . Multiple Vitamin (MULTIVITAMIN) capsule Take 1 capsule by mouth daily.      . Multiple Vitamins-Minerals (PRESERVISION/LUTEIN) CAPS Take 1 capsule by mouth daily.      . naproxen sodium (ANAPROX) 220 MG tablet Take 220 mg by mouth daily.    Marland Kitchen spironolactone (ALDACTONE) 25 MG tablet TAKE ONE (1) TABLET BY MOUTH EVERY DAY 90 tablet 3   No current facility-administered medications on file prior to visit.    Review of Systems Review of Systems  Constitutional: Negative for fever,  appetite change, fatigue and unexpected weight change.  Eyes: Negative for pain and visual disturbance.  Respiratory: Negative for cough and shortness of breath.   Cardiovascular: Negative for cp or palpitations    Gastrointestinal: Negative for nausea, diarrhea and constipation.  Genitourinary: Negative for urgency and frequency.  Skin: Negative for pallor or rash  pos for redness of L index finger around nail  Neurological: Negative for weakness, light-headedness, numbness and headaches. pos for tremor  Hematological:  Negative for adenopathy. Does not bruise/bleed easily.  Psychiatric/Behavioral: Negative for dysphoric mood. The patient is not nervous/anxious.         Objective:   Physical Exam  Constitutional: He appears well-developed and well-nourished. No distress.  Somewhat frail app elderly male   HENT:  Head: Normocephalic and atraumatic.  Right Ear: External ear normal.  Left Ear: External ear normal.  Nose: Nose normal.  Mouth/Throat: Oropharynx is clear and moist.  Eyes: Conjunctivae and EOM are normal. Pupils are equal, round, and reactive to light. Right eye exhibits no discharge. Left eye exhibits no discharge. No scleral icterus.  Neck: Normal range of motion. Neck supple. No JVD present. Carotid bruit is not present. No thyromegaly present.  Cardiovascular: Normal rate, regular rhythm, normal heart sounds and intact distal pulses.  Exam reveals no gallop.   Pulmonary/Chest: Effort normal and breath sounds normal. No respiratory distress. He has no wheezes. He exhibits no tenderness.  Diffusely distant bs   Abdominal: Soft. Bowel sounds are normal. He exhibits no distension, no abdominal bruit and no mass. There is no tenderness.  No HSM  Musculoskeletal: He exhibits no edema or tenderness.  Lymphadenopathy:    He has no cervical adenopathy.  Neurological: He is alert. He has normal reflexes. He displays tremor. No cranial nerve deficit. He exhibits normal muscle  tone. Coordination normal.  bilat hand tremor (baseline)  Skin: Skin is warm and dry. No rash noted. No erythema. No pallor.  L index finger has mild lateral paronychia  slt redness/swelling/tenderness w/o drainage Nail appears normal   Psychiatric: He has a normal mood and affect.  Pleasant           Assessment & Plan:   Problem List Items Addressed This Visit    Alcohol abuse    Risks explained to health /also accidents Pt denies problem and has no plans to change 7-8 beers daily  Poss reason for low sodium (mild) Also for high mcv Has OP Disc imp of supplements (B vit) and also balanced diet       Colon cancer screening    Polyp in 09 Declines f/u colonosc Given IFOB kit No c/o      Relevant Orders   Fecal occult blood, imunochemical   Encounter for Medicare annual wellness exam - Primary    Reviewed health habits including diet and exercise and skin cancer prevention Reviewed appropriate screening tests for age  Also reviewed health mt list, fam hx and immunization status , as well as social and family history   See HPI Lab reviewed Enc to quit drinking alcohol  Given materials to work on Insurance underwriter  IFOB kit given (declines colonoscopy)      Essential hypertension    bp in fair control at this time  BP Readings from Last 1 Encounters:  10/02/14 135/60   No changes needed Disc lifstyle change with low sodium diet and exercise  Labs reviewed       Relevant Medications   amLODipine (NORVASC) 10 MG tablet   doxazosin (CARDURA) 8 MG tablet   spironolactone (ALDACTONE) 25 MG tablet   Osteoporosis    Pt declines dexa  Had 5 y of fosamax in the past Disc need for calcium/ vitamin D/ wt bearing exercise and bone density test every 2 y to monitor Disc safety/ fracture risk in detail   Last D level was nl  No falls or fx       Paronychia of fifth finger of left hand  Mild L index finger Disc cleaning with antibacterial soap and water /use of abx  oint Given 1 week of tid keflex-f/u if worse or no imp or drainage  Disc red flags to watch for (red/swelling/pain/drainage)      Routine general medical examination at a health care facility    Reviewed health habits including diet and exercise and skin cancer prevention Reviewed appropriate screening tests for age  Also reviewed health mt list, fam hx and immunization status , as well as social and family history   See HPI Lab reviewed Enc to quit drinking alcohol  Given materials to work on Insurance underwriter  [employer] given (declines colonoscopy)

## 2014-10-02 NOTE — Patient Instructions (Signed)
Please do IFOB stool kit for colon cancer screening Work on and advance directive (living will)- look at the blue packet I gave you  Think about cutting down or quitting drinking - it puts your health at risk  Take keflex (antibiotic) for finger infection - if no improvement or if it gets worse (swollen/red/painful) let me know  Keep it clean with antibacterial soap and water

## 2014-10-02 NOTE — Assessment & Plan Note (Signed)
Polyp in 09 Declines f/u colonosc Given IFOB kit No c/o

## 2014-10-02 NOTE — Assessment & Plan Note (Signed)
Reviewed health habits including diet and exercise and skin cancer prevention Reviewed appropriate screening tests for age  Also reviewed health mt list, fam hx and immunization status , as well as social and family history   See HPI Lab reviewed Enc to quit drinking alcohol  Given materials to work on Insurance underwriter  IFOB kit given (declines colonoscopy)

## 2014-10-02 NOTE — Assessment & Plan Note (Signed)
Mild L index finger Disc cleaning with antibacterial soap and water /use of abx oint Given 1 week of tid keflex-f/u if worse or no imp or drainage  Disc red flags to watch for (red/swelling/pain/drainage)

## 2014-10-02 NOTE — Assessment & Plan Note (Signed)
bp in fair control at this time  BP Readings from Last 1 Encounters:  10/02/14 135/60   No changes needed Disc lifstyle change with low sodium diet and exercise  Labs reviewed

## 2014-10-02 NOTE — Assessment & Plan Note (Signed)
Risks explained to health /also accidents Pt denies problem and has no plans to change 7-8 beers daily  Poss reason for low sodium (mild) Also for high mcv Has OP Disc imp of supplements (B vit) and also balanced diet

## 2014-10-02 NOTE — Assessment & Plan Note (Signed)
Pt declines dexa  Had 5 y of fosamax in the past Disc need for calcium/ vitamin D/ wt bearing exercise and bone density test every 2 y to monitor Disc safety/ fracture risk in detail   Last D level was nl  No falls or fx

## 2014-12-24 ENCOUNTER — Other Ambulatory Visit: Payer: Self-pay | Admitting: *Deleted

## 2014-12-24 MED ORDER — SPIRONOLACTONE 25 MG PO TABS: ORAL_TABLET | ORAL | Status: DC

## 2014-12-24 NOTE — Telephone Encounter (Signed)
Pt changed pharmacies Rx sent to new pharmacy  

## 2015-03-16 ENCOUNTER — Encounter: Payer: Self-pay | Admitting: Internal Medicine

## 2015-03-30 ENCOUNTER — Ambulatory Visit
Admission: RE | Admit: 2015-03-30 | Discharge: 2015-03-30 | Disposition: A | Discharge status: Home / Self Care | Payer: Medicare PPO | Source: Ambulatory Visit | Attending: Otolaryngology | Admitting: Otolaryngology

## 2015-03-30 ENCOUNTER — Other Ambulatory Visit: Payer: Self-pay | Admitting: Otolaryngology

## 2015-03-30 DIAGNOSIS — Z8701 Personal history of pneumonia (recurrent): Secondary | ICD-10-CM

## 2015-03-31 ENCOUNTER — Other Ambulatory Visit (HOSPITAL_COMMUNITY): Payer: Self-pay | Admitting: Otolaryngology

## 2015-03-31 DIAGNOSIS — J320 Chronic maxillary sinusitis: Secondary | ICD-10-CM

## 2015-03-31 DIAGNOSIS — R0981 Nasal congestion: Secondary | ICD-10-CM

## 2015-03-31 DIAGNOSIS — J322 Chronic ethmoidal sinusitis: Secondary | ICD-10-CM

## 2015-04-06 ENCOUNTER — Encounter (HOSPITAL_COMMUNITY): Payer: Self-pay

## 2015-04-06 ENCOUNTER — Ambulatory Visit (HOSPITAL_COMMUNITY)
Admission: RE | Admit: 2015-04-06 | Discharge: 2015-04-06 | Disposition: A | Discharge status: Home / Self Care | Payer: Medicare PPO | Source: Ambulatory Visit | Attending: Otolaryngology | Admitting: Otolaryngology

## 2015-04-06 DIAGNOSIS — J322 Chronic ethmoidal sinusitis: Secondary | ICD-10-CM | POA: Diagnosis not present

## 2015-04-06 DIAGNOSIS — J320 Chronic maxillary sinusitis: Secondary | ICD-10-CM | POA: Diagnosis not present

## 2015-04-06 DIAGNOSIS — R0981 Nasal congestion: Secondary | ICD-10-CM | POA: Diagnosis present

## 2015-04-06 LAB — POCT I-STAT CREATININE: Creatinine, Ser: 0.9 mg/dL (ref 0.61–1.24)

## 2015-04-06 MED ORDER — IOHEXOL 300 MG/ML  SOLN
75.0000 mL | Freq: Once | INTRAMUSCULAR | Status: AC | PRN
  Administered 2015-04-06: 75 mL via INTRAVENOUS

## 2015-05-05 ENCOUNTER — Ambulatory Visit (INDEPENDENT_AMBULATORY_CARE_PROVIDER_SITE_OTHER): Payer: Medicare Other | Admitting: Family Medicine

## 2015-05-05 ENCOUNTER — Encounter: Payer: Self-pay | Admitting: Family Medicine

## 2015-05-05 ENCOUNTER — Ambulatory Visit (INDEPENDENT_AMBULATORY_CARE_PROVIDER_SITE_OTHER)
Admission: RE | Admit: 2015-05-05 | Discharge: 2015-05-05 | Disposition: A | Discharge status: Home / Self Care | Payer: Medicare Other | Source: Ambulatory Visit | Attending: Family Medicine | Admitting: Family Medicine

## 2015-05-05 VITALS — BP 132/70 | HR 76 | Temp 98.2°F | Ht 66.0 in | Wt 161.5 lb

## 2015-05-05 DIAGNOSIS — M79604 Pain in right leg: Secondary | ICD-10-CM

## 2015-05-05 DIAGNOSIS — M791 Myalgia: Secondary | ICD-10-CM | POA: Diagnosis not present

## 2015-05-05 DIAGNOSIS — M79605 Pain in left leg: Secondary | ICD-10-CM

## 2015-05-05 DIAGNOSIS — M7918 Myalgia, other site: Secondary | ICD-10-CM

## 2015-05-05 DIAGNOSIS — I10 Essential (primary) hypertension: Secondary | ICD-10-CM | POA: Diagnosis not present

## 2015-05-05 NOTE — Progress Notes (Signed)
Subjective:    Patient ID: Elijah Nixon, male    DOB: 01/03/1933, 82 y.o.   MRN: [id number]  HPI  Here for leg pain - about 5-6 weeks   Notices it mostly at night after he has been lying down for a while -very stiff to get up to urinate or get up in the am  Takes 15 minutes to get out of bed and get dressed and get a cup of coffee  Gets a little better as he moves  Improves by noon   When he goes out/to the store -still hurts but he can tolerate it   Hurts in calves and back of legs and into buttocks  Low back not bothering him much (has hx of fx vertebrae)  No pain in the groin   No numbness  No weakness in feet or toes  No leg swelling   occ takes an ibuprofen otc -does not help at all  No ice or heat  Still drinks alcohol daily  No trauma    Patient Active Problem List   Diagnosis Date Noted  . Lumbar disc disease 05/06/2015  . Leg pain, bilateral 05/05/2015  . Buttock pain 05/05/2015  . Routine general medical examination at a health care facility 10/02/2014  . Paronychia of fifth finger of left hand 10/02/2014  . Colon cancer screening 09/29/2013  . Encounter for Medicare annual wellness exam 09/27/2012  . Seborrheic keratosis 03/29/2012  . Stress reaction 09/21/2010  . SWELLING, MASS, OR LUMP IN CHEST 06/24/2009  . ERECTILE DYSFUNCTION 02/16/2009  . ACTINIC KERATOSIS 02/16/2009  . ADVERSE DRUG REACTION 11/13/2008  . Alcohol abuse 10/27/2008  . Essential hypertension 10/27/2008  . GEN OSTEOARTHROSIS INVOLVING MULTIPLE SITES 10/27/2008  . Osteoporosis 10/27/2008  . COLONIC POLYPS, HX OF 10/27/2008  . DIVERTICULITIS, HX OF 10/27/2008   Past Medical History  Diagnosis Date  . History of diverticulitis of colon   . Hypertension   . Arthritis     OA  . History of colon polyps   . DDD (degenerative disc disease)   . Osteoporosis     spinal compression fx  . Macular degeneration     dry  . AA (alcohol abuse)     at least 8-10 drinks/day  . ED  (erectile dysfunction)    Past Surgical History  Procedure Laterality Date  . Spine surgery  1971    deg disc disease x 2   Social History  Substance Use Topics  . Smoking status: Never Smoker   . Smokeless tobacco: Never Used  . Alcohol Use: 0.0 oz/week    0 Standard drinks or equivalent per week     Comment: 7-8 beers per day   Family History  Problem Relation Age of Onset  . Diabetes Son    Allergies  Allergen Reactions  . Lisinopril     REACTION: increased potassium leveles   Current Outpatient Prescriptions on File Prior to Visit  Medication Sig Dispense Refill  . amLODipine (NORVASC) 10 MG tablet TAKE 1 TABLET BY MOUTH DAILY 90 tablet 3  . aspirin 81 MG tablet Take 81 mg by mouth daily.      Marland Kitchen b complex vitamins tablet Take 1 tablet by mouth daily.    . Calcium Carbonate-Vit D-Min 600-400 MG-UNIT TABS Take 1 tablet by mouth daily.     . cephALEXin (KEFLEX) 500 MG capsule Take 1 capsule (500 mg total) by mouth 3 (three) times daily. 21 capsule 0  . Cyanocobalamin (VITAMIN B-12  PO) Take 1 tablet by mouth daily.    Marland Kitchen doxazosin (CARDURA) 8 MG tablet TAKE ONE (1) TABLET BY MOUTH EVERY DAY 90 tablet 3  . famotidine (PEPCID) 20 MG tablet Take 20 mg by mouth daily.    . Multiple Vitamin (MULTIVITAMIN) capsule Take 1 capsule by mouth daily.      . Multiple Vitamins-Minerals (PRESERVISION/LUTEIN) CAPS Take 1 capsule by mouth daily.      . naproxen sodium (ANAPROX) 220 MG tablet Take 220 mg by mouth daily.    Marland Kitchen spironolactone (ALDACTONE) 25 MG tablet TAKE ONE (1) TABLET BY MOUTH EVERY DAY 90 tablet 1   No current facility-administered medications on file prior to visit.     Review of Systems Review of Systems  Constitutional: Negative for fever, appetite change, fatigue and unexpected weight change.  Eyes: Negative for pain and visual disturbance.  Respiratory: Negative for cough and shortness of breath.   Cardiovascular: Negative for cp or palpitations    Gastrointestinal:  Negative for nausea, diarrhea and constipation.  Genitourinary: Negative for urgency and frequency.  MSK pos for buttock and let pain  Skin: Negative for pallor or rash   Neurological: Negative for weakness, light-headedness, numbness and headaches.  Hematological: Negative for adenopathy. Does not bruise/bleed easily.  Psychiatric/Behavioral: Negative for dysphoric mood. The patient is not nervous/anxious.         Objective:   Physical Exam  Constitutional: He appears well-developed and well-nourished. No distress.  Well appearing Gait is slow and labored   HENT:  Head: Normocephalic and atraumatic.  Mouth/Throat: Oropharynx is clear and moist.  Eyes: Conjunctivae and EOM are normal. Pupils are equal, round, and reactive to light.  Neck: Normal range of motion. Neck supple.  Cardiovascular: Normal rate and regular rhythm.   Pulmonary/Chest: Effort normal and breath sounds normal. No respiratory distress. He has no wheezes.  Musculoskeletal: He exhibits tenderness. He exhibits no edema.  Mild lower LS tenderness with limited flexion  No hip/pelvic or knee tenderness Nl rom LEs Much pain with gait however-pt walks stooped over No foot drop noted   Pedal pulses are palpable   Lymphadenopathy:    He has no cervical adenopathy.  Neurological: He is alert. He has normal reflexes. He displays tremor. No cranial nerve deficit or sensory deficit. He exhibits normal muscle tone. Gait abnormal. Coordination normal.  Baseline hand tremor   Skin: Skin is warm and dry. No rash noted. No erythema. No pallor.  Psychiatric: He has a normal mood and affect.          Assessment & Plan:   Problem List Items Addressed This Visit      Cardiovascular and Mediastinum   Essential hypertension - Primary    BP: 132/70 mmHg  Lab today        Relevant Orders   Comprehensive metabolic panel (Completed)     Other   Buttock pain    Pt describes pain as from buttocks down -worse (with  stiffness) after inactivity  Nl pedal pulses on exam  Non focal exam XR of LS and hips today  Also labs for lytes and magnesium to make sure there is no deficiency to cause cramping or pain  Also CPK  Will advise and make a plan with results Gave him the ok for conservative otc ibuprofen use with meals (no more than that given etoh intake)      Relevant Orders   DG HIPS BILAT WITH PELVIS 2V (Completed)   DG Lumbar Spine  Complete (Completed)   Leg pain, bilateral    Pt describes pain as from buttocks down -worse (with stiffness) after inactivity  Nl pedal pulses on exam  Non focal exam XR of LS and hips today  Also labs for lytes and magnesium to make sure there is no deficiency to cause cramping or pain  Also CPK  Will advise and make a plan with results Gave him the ok for conservative otc ibuprofen use with meals (no more than that given etoh intake)       Relevant Orders   DG HIPS BILAT WITH PELVIS 2V (Completed)   DG Lumbar Spine Complete (Completed)   CK (Completed)   Magnesium (Completed)

## 2015-05-05 NOTE — Progress Notes (Signed)
Pre visit review using our clinic review tool, if applicable. No additional management support is needed unless otherwise documented below in the visit note. 

## 2015-05-05 NOTE — Patient Instructions (Addendum)
Use heat or a warm bath or shower to help your leg pain A heating pad for 10 minutes at a time is also ok  Let me know if you get fever or chills  Xray of hips and low back today  Also labs to see if anything could cause muscle cramps or irritability   If you want to take ibuprofen - stick with 2 pills with a meal three times daily maximum

## 2015-05-05 NOTE — Assessment & Plan Note (Signed)
Pt describes pain as from buttocks down -worse (with stiffness) after inactivity  Nl pedal pulses on exam  Non focal exam XR of LS and hips today  Also labs for lytes and magnesium to make sure there is no deficiency to cause cramping or pain  Also CPK  Will advise and make a plan with results Gave him the ok for conservative otc ibuprofen use with meals (no more than that given etoh intake)  

## 2015-05-05 NOTE — Assessment & Plan Note (Signed)
BP: 132/70 mmHg  Lab today

## 2015-05-05 NOTE — Assessment & Plan Note (Signed)
Pt describes pain as from buttocks down -worse (with stiffness) after inactivity  Nl pedal pulses on exam  Non focal exam XR of LS and hips today  Also labs for lytes and magnesium to make sure there is no deficiency to cause cramping or pain  Also CPK  Will advise and make a plan with results Gave him the ok for conservative otc ibuprofen use with meals (no more than that given etoh intake)

## 2015-05-06 ENCOUNTER — Telehealth: Payer: Self-pay | Admitting: Family Medicine

## 2015-05-06 DIAGNOSIS — M7918 Myalgia, other site: Secondary | ICD-10-CM

## 2015-05-06 DIAGNOSIS — M519 Unspecified thoracic, thoracolumbar and lumbosacral intervertebral disc disorder: Secondary | ICD-10-CM

## 2015-05-06 DIAGNOSIS — M79605 Pain in left leg: Principal | ICD-10-CM

## 2015-05-06 DIAGNOSIS — M79604 Pain in right leg: Secondary | ICD-10-CM

## 2015-05-06 LAB — COMPREHENSIVE METABOLIC PANEL
ALK PHOS: 54 U/L (ref 39–117)
ALT: 13 U/L (ref 0–53)
AST: 21 U/L (ref 0–37)
Albumin: 4.1 g/dL (ref 3.5–5.2)
BUN: 9 mg/dL (ref 6–23)
CO2: 25 mEq/L (ref 19–32)
Calcium: 9.4 mg/dL (ref 8.4–10.5)
Chloride: 97 mEq/L (ref 96–112)
Creatinine, Ser: 0.9 mg/dL (ref 0.40–1.50)
GFR: 86.21 mL/min (ref >60.00)
GLUCOSE: 88 mg/dL (ref 70–99)
Potassium: 4.3 mEq/L (ref 3.5–5.1)
Sodium: 130 mEq/L — ABNORMAL LOW (ref 135–145)
TOTAL PROTEIN: 7.1 g/dL (ref 6.0–8.3)
Total Bilirubin: 0.4 mg/dL (ref 0.2–1.2)

## 2015-05-06 LAB — MAGNESIUM: Magnesium: 2 mg/dL (ref 1.5–2.5)

## 2015-05-06 LAB — CK: Total CK: 85 U/L (ref 7–232)

## 2015-05-06 NOTE — Telephone Encounter (Signed)
Ref done  

## 2015-05-06 NOTE — Telephone Encounter (Signed)
-----   Message from Shon Millet, [place] sent at 05/06/2015 11:39 AM EST ----- Pt notified of xray results and Dr. Royden Purl recommendations. Pt is okay with referral to ortho doc he does prefer to see someone in [place] but any office is fine, please put referral in and I have advised pt that one of our PCC's will call him to schedule appt

## 2015-05-17 ENCOUNTER — Other Ambulatory Visit: Payer: Self-pay | Admitting: Family Medicine

## 2015-05-17 DIAGNOSIS — E871 Hypo-osmolality and hyponatremia: Secondary | ICD-10-CM

## 2015-05-24 ENCOUNTER — Other Ambulatory Visit: Payer: Self-pay | Admitting: Family Medicine

## 2015-05-24 ENCOUNTER — Other Ambulatory Visit (INDEPENDENT_AMBULATORY_CARE_PROVIDER_SITE_OTHER): Payer: Medicare Other

## 2015-05-24 DIAGNOSIS — E871 Hypo-osmolality and hyponatremia: Secondary | ICD-10-CM

## 2015-05-24 LAB — BASIC METABOLIC PANEL
BUN: 12 mg/dL (ref 6–23)
CALCIUM: 9.8 mg/dL (ref 8.4–10.5)
CO2: 28 mEq/L (ref 19–32)
Chloride: 99 mEq/L (ref 96–112)
Creatinine, Ser: 0.88 mg/dL (ref 0.40–1.50)
GFR: 88.46 mL/min (ref >60.00)
Glucose, Bld: 105 mg/dL — ABNORMAL HIGH (ref 70–99)
Potassium: 4.1 mEq/L (ref 3.5–5.1)
SODIUM: 135 meq/L (ref 135–145)

## 2015-05-24 MED ORDER — SPIRONOLACTONE 25 MG PO TABS: 12.5000 mg | ORAL_TABLET | Freq: Every day | ORAL | Status: DC

## 2015-09-23 ENCOUNTER — Telehealth: Payer: Self-pay | Admitting: Family Medicine

## 2015-09-23 DIAGNOSIS — Z0000 Encounter for general adult medical examination without abnormal findings: Secondary | ICD-10-CM

## 2015-09-23 DIAGNOSIS — F528 Other sexual dysfunction not due to a substance or known physiological condition: Secondary | ICD-10-CM

## 2015-09-23 NOTE — Telephone Encounter (Signed)
-----   Message from Baldomero LamyNatasha C Chavers sent at 09/22/2015 11:50 AM EDT ----- Regarding: Cpx labs Thurs 6/15, need orders. Thanks! :-) Please order  future cpx labs for pt's upcoming lab appt. Thanks Rodney Boozeasha

## 2015-09-30 ENCOUNTER — Ambulatory Visit (INDEPENDENT_AMBULATORY_CARE_PROVIDER_SITE_OTHER): Payer: Medicare Other

## 2015-09-30 ENCOUNTER — Other Ambulatory Visit (INDEPENDENT_AMBULATORY_CARE_PROVIDER_SITE_OTHER): Payer: Medicare Other

## 2015-09-30 VITALS — BP 128/62 | HR 89 | Temp 97.6°F | Ht 66.0 in | Wt 154.8 lb

## 2015-09-30 DIAGNOSIS — F528 Other sexual dysfunction not due to a substance or known physiological condition: Secondary | ICD-10-CM

## 2015-09-30 DIAGNOSIS — Z0000 Encounter for general adult medical examination without abnormal findings: Secondary | ICD-10-CM

## 2015-09-30 LAB — PSA: PSA: 2.58 ng/mL (ref 0.10–4.00)

## 2015-09-30 LAB — CBC WITH DIFFERENTIAL/PLATELET
Basophils Absolute: 0.1 10*3/uL (ref 0.0–0.1)
Basophils Relative: 1.2 % (ref 0.0–3.0)
EOS ABS: 0.2 10*3/uL (ref 0.0–0.7)
EOS PCT: 3.8 % (ref 0.0–5.0)
HEMATOCRIT: 40.2 % (ref 39.0–52.0)
HEMOGLOBIN: 14 g/dL (ref 13.0–17.0)
LYMPHS PCT: 30 % (ref 12.0–46.0)
Lymphs Abs: 1.6 10*3/uL (ref 0.7–4.0)
MCHC: 34.9 g/dL (ref 30.0–36.0)
MCV: 101.6 fl — ABNORMAL HIGH (ref 78.0–100.0)
Monocytes Absolute: 0.6 10*3/uL (ref 0.1–1.0)
Monocytes Relative: 10.1 % (ref 3.0–12.0)
Neutro Abs: 3 10*3/uL (ref 1.4–7.7)
Neutrophils Relative %: 54.9 % (ref 43.0–77.0)
Platelets: 292 10*3/uL (ref 150.0–400.0)
RBC: 3.96 Mil/uL — AB (ref 4.22–5.81)
RDW: 13.1 % (ref 11.5–15.5)
WBC: 5.5 10*3/uL (ref 4.0–10.5)

## 2015-09-30 LAB — LIPID PANEL
CHOLESTEROL: 213 mg/dL — AB (ref 0–200)
HDL: 111.8 mg/dL (ref >39.00)
LDL CALC: 87 mg/dL (ref 0–99)
NonHDL: 100.91
TRIGLYCERIDES: 70 mg/dL (ref 0.0–149.0)
Total CHOL/HDL Ratio: 2
VLDL: 14 mg/dL (ref 0.0–40.0)

## 2015-09-30 LAB — COMPREHENSIVE METABOLIC PANEL
ALBUMIN: 4.2 g/dL (ref 3.5–5.2)
ALK PHOS: 55 U/L (ref 39–117)
ALT: 18 U/L (ref 0–53)
AST: 22 U/L (ref 0–37)
BILIRUBIN TOTAL: 0.5 mg/dL (ref 0.2–1.2)
BUN: 8 mg/dL (ref 6–23)
CALCIUM: 9.5 mg/dL (ref 8.4–10.5)
CHLORIDE: 100 meq/L (ref 96–112)
CO2: 27 mEq/L (ref 19–32)
CREATININE: 0.85 mg/dL (ref 0.40–1.50)
GFR: 92 mL/min (ref >60.00)
Glucose, Bld: 95 mg/dL (ref 70–99)
Potassium: 4.1 mEq/L (ref 3.5–5.1)
Sodium: 135 mEq/L (ref 135–145)
TOTAL PROTEIN: 7.1 g/dL (ref 6.0–8.3)

## 2015-09-30 LAB — TSH: TSH: 1.33 u[IU]/mL (ref 0.35–4.50)

## 2015-09-30 NOTE — Progress Notes (Signed)
Subjective:   Elijah Nixon is a 82 y.o. male who presents for Medicare Annual/Subsequent preventive examination.  Review of Systems:  N/A Cardiac Risk Factors include: advanced age (>34men, >37 women);sedentary lifestyle;male gender;hypertension     Objective:    Vitals: BP 128/62 mmHg  Pulse 89  Temp(Src) 97.6 F (36.4 C) (Oral)  Ht  (1.676 m)  Wt 154 lb 12 oz (70.194 kg)  BMI 24.99 kg/m2  SpO2 97%  Body mass index is 24.99 kg/(m^2).  Tobacco History  Smoking status  . Never Smoker   Smokeless tobacco  . Never Used     Counseling given: No   Past Medical History  Diagnosis Date  . History of diverticulitis of colon   . Hypertension   . Arthritis     OA  . History of colon polyps   . DDD (degenerative disc disease)   . Osteoporosis     spinal compression fx  . Macular degeneration     dry  . AA (alcohol abuse)     at least 8-10 drinks/day  . ED (erectile dysfunction)    Past Surgical History  Procedure Laterality Date  . Spine surgery  1971    deg disc disease x 2   Family History  Problem Relation Age of Onset  . Diabetes Son    History  Sexual Activity  . Sexual Activity: No    Outpatient Encounter Prescriptions as of 09/30/2015  Medication Sig  . amLODipine (NORVASC) 10 MG tablet TAKE 1 TABLET BY MOUTH DAILY  . aspirin 81 MG tablet Take 81 mg by mouth daily.    Marland Kitchen b complex vitamins tablet Take 1 tablet by mouth daily.  . Calcium Carbonate-Vit D-Min 600-400 MG-UNIT TABS Take 1 tablet by mouth daily.   . Cyanocobalamin (VITAMIN B-12 PO) Take 1 tablet by mouth daily.  Marland Kitchen doxazosin (CARDURA) 8 MG tablet TAKE ONE (1) TABLET BY MOUTH EVERY DAY  . famotidine (PEPCID) 20 MG tablet Take 20 mg by mouth as needed.   . Multiple Vitamin (MULTIVITAMIN) capsule Take 1 capsule by mouth daily.    . Multiple Vitamins-Minerals (PRESERVISION/LUTEIN) CAPS Take 1 capsule by mouth daily.    . naproxen sodium (ANAPROX) 220 MG tablet Take 220 mg by mouth  daily.  Marland Kitchen spironolactone (ALDACTONE) 25 MG tablet Take 0.5 tablets (12.5 mg total) by mouth daily.  . [DISCONTINUED] cephALEXin (KEFLEX) 500 MG capsule Take 1 capsule (500 mg total) by mouth 3 (three) times daily.   No facility-administered encounter medications on file as of 09/30/2015.    Activities of Daily Living In your present state of health, do you have any difficulty performing the following activities: 09/30/2015  Hearing? Y  Vision? Y  Difficulty concentrating or making decisions? N  Walking or climbing stairs? N  Dressing or bathing? N  Doing errands, shopping? Y  Preparing Food and eating ? N  Using the Toilet? N  In the past six months, have you accidently leaked urine? N  Do you have problems with loss of bowel control? N  Managing your Medications? N  Managing your Finances? N  Housekeeping or managing your Housekeeping? N    Patient Care Team: Judy Pimple, MD as PCP - General Edmon Crape, MD as Consulting Physician (Ophthalmology) [facility] as Referring Physician (Orthopedic Surgery) Olivia Canter, MD as Consulting Physician (Ophthalmology)   Assessment:     Hearing Screening           Right ear:   40 0 40 0   Left ear:   0 0 40 0   Vision Screening Comments: Last eye exam with Dr. Luciana Axeankin approx. 6 mths ago   Exercise Activities and Dietary recommendations Current Exercise Habits: The patient does not participate in regular exercise at present, Exercise limited by: None identified  Goals    . Increase water intake     Starting 09/30/2015, I will attempt to drink at least 6 glasses of water daily.       Fall Risk Fall Risk  09/30/2015 10/02/2014 09/29/2013 09/27/2012  Falls in the past year? No No No No   Depression Screen PHQ 2/9 Scores 09/30/2015 10/02/2014 09/29/2013 09/27/2012  PHQ - 2 Score 0 0 0 0    Cognitive Testing MMSE - Mini Mental State Exam 09/30/2015  Orientation to  time 5  Orientation to Place 5  Registration 3  Attention/ Calculation 0  Recall 3  Language- name 2 objects 0  Language- repeat 1  Language- follow 3 step command 3  Language- read & follow direction 0  Write a sentence 0  Copy design 0  Total score 20   PLEASE NOTE: A Mini-Cog screen was completed. Maximum score is 20. A value of 0 denotes this part of Folstein MMSE was not completed or the patient failed this part of the Mini-Cog screening.   Mini-Cog Screening Orientation to Time - Max 5 pts Orientation to Place - Max 5 pts Registration - Max 3 pts Recall - Max 3 pts Language Repeat - Max 1 pts Language Follow 3 Step Command - Max 3 pts   Immunization History  Administered Date(s) Administered  . Influenza Split 02/10/2011, 03/29/2012, 02/10/2014  . Influenza Whole 02/16/2009, 01/05/2010  . Influenza,inj,Quad PF,6-35 Mos 12/24/2012  . Influenza-Unspecified 02/23/2015  . Pneumococcal Conjugate-13 09/29/2013  . Pneumococcal Polysaccharide-23 04/17/2004  . Td 04/17/2008  . Zoster 07/17/2003   Screening Tests Health Maintenance  Topic Date Due  . INFLUENZA VACCINE  11/16/2015  . TETANUS/TDAP  04/17/2018  . ZOSTAVAX  Addressed  . PNA vac Low Risk Adult  Completed      Plan:     I have personally reviewed and addressed the Medicare Annual Wellness questionnaire and have noted the following in the patient's chart:  A. Medical and social history B. Use of alcohol, tobacco or illicit drugs  C. Current medications and supplements D. Functional ability and status E.  Nutritional status F.  Physical activity G. Advance directives H. List of other physicians I.  Hospitalizations, surgeries, and ER visits in previous 12 months J.  Vitals K. Screenings to include hearing, vision, cognitive, depression L. Referrals and appointments - none  In addition, I have reviewed and discussed with patient certain preventive protocols, quality metrics, and best practice  recommendations. A written personalized care plan for preventive services as well as general preventive health recommendations were provided to patient.  See attached scanned questionnaire for additional information.   Signed,   Randa EvensLesia Pinson, MHA, BS, LPN Health Advisor

## 2015-09-30 NOTE — Patient Instructions (Signed)
[name] , Thank you for taking time to come for your Medicare Wellness Visit. I appreciate your ongoing commitment to your health goals. Please review the following plan we discussed and let me know if I can assist you in the future.   These are the goals we discussed: Goals    . Increase water intake     Starting 09/30/2015, I will attempt to drink at least 6 glasses of water daily.        This is a list of the screening recommended for you and due dates:  Health Maintenance  Topic Date Due  . Flu Shot  11/16/2015  . Tetanus Vaccine  04/17/2018  . Shingles Vaccine  Addressed  . Pneumonia vaccines  Completed    Preventive Care for Adults  A healthy lifestyle and preventive care can promote health and wellness. Preventive health guidelines for adults include the following key practices.  . A routine yearly physical is a good way to check with your health care provider about your health and preventive screening. It is a chance to share any concerns and updates on your health and to receive a thorough exam.  . Visit your dentist for a routine exam and preventive care every 6 months. Brush your teeth twice a day and floss once a day. Good oral hygiene prevents tooth decay and gum disease.  . The frequency of eye exams is based on your age, health, family medical history, use  of contact lenses, and other factors. Follow your health care provider's ecommendations for frequency of eye exams.  . Eat a healthy diet. Foods like vegetables, fruits, whole grains, low-fat dairy products, and lean protein foods contain the nutrients you need without too many calories. Decrease your intake of foods high in solid fats, added sugars, and salt. Eat the right amount of calories for you. Get information about a proper diet from your health care provider, if necessary.  . Regular physical exercise is one of the most important things you can do for your health. Most adults should get at least 150  minutes of moderate-intensity exercise (any activity that increases your heart rate and causes you to sweat) each week. In addition, most adults need muscle-strengthening exercises on 2 or more days a week.  Silver Sneakers may be a benefit available to you. To determine eligibility, you may visit the website: www.silversneakers.com or contact program at (828)[phone] Mon-Fri between 8AM-8PM.   . Maintain a healthy weight. The body mass index (BMI) is a screening tool to identify possible weight problems. It provides an estimate of body fat based on height and weight. Your health care provider can find your BMI and can help you achieve or maintain a healthy weight.   For adults 20 years and older: ? A BMI below 18.5 is considered underweight. ? A BMI of 18.5 to 24.9 is normal. ? A BMI of 25 to 29.9 is considered overweight. ? A BMI of 30 and above is considered obese.   . Maintain normal blood lipids and cholesterol levels by exercising and minimizing your intake of saturated fat. Eat a balanced diet with plenty of fruit and vegetables. Blood tests for lipids and cholesterol should begin at age 80 and be repeated every 5 years. If your lipid or cholesterol levels are high, you are over 50, or you are at high risk for heart disease, you may need your cholesterol levels checked more frequently. Ongoing high lipid and cholesterol levels should be treated with medicines if  diet and exercise are not working.  . If you smoke, find out from your health care provider how to quit. If you do not use tobacco, please do not start.  . If you choose to drink alcohol, please do not consume more than 2 drinks per day. One drink is considered to be 12 ounces (355 mL) of beer, 5 ounces (148 mL) of wine, or 1.5 ounces (44 mL) of liquor.  . If you are 8-66 years old, ask your health care provider if you should take aspirin to prevent strokes.  . Use sunscreen. Apply sunscreen liberally and repeatedly throughout  the day. You should seek shade when your shadow is shorter than you. Protect yourself by wearing long sleeves, pants, a wide-brimmed hat, and sunglasses year round, whenever you are outdoors.  . Once a month, do a whole body skin exam, using a mirror to look at the skin on your back. Tell your health care provider of new moles, moles that have irregular borders, moles that are larger than a pencil eraser, or moles that have changed in shape or color.

## 2015-09-30 NOTE — Progress Notes (Signed)
Pre visit review using our clinic review tool, if applicable. No additional management support is needed unless otherwise documented below in the visit note. 

## 2015-09-30 NOTE — Progress Notes (Signed)
PCP notes:  Health maintenance:   No gaps identified or addressed.  Abnormal screenings:  Hearing - failed  Patient concerns: None  Nurse concerns: None  Next PCP appt: 10/05/15 @ 1030  I reviewed health advisor's note, was available for consultation, and agree with documentation and plan.

## 2015-10-05 ENCOUNTER — Ambulatory Visit (INDEPENDENT_AMBULATORY_CARE_PROVIDER_SITE_OTHER): Payer: Medicare Other | Admitting: Family Medicine

## 2015-10-05 ENCOUNTER — Encounter: Payer: Self-pay | Admitting: Family Medicine

## 2015-10-05 VITALS — BP 138/66 | HR 78 | Temp 98.3°F | Ht 66.0 in | Wt 158.0 lb

## 2015-10-05 DIAGNOSIS — Z0000 Encounter for general adult medical examination without abnormal findings: Secondary | ICD-10-CM | POA: Diagnosis not present

## 2015-10-05 DIAGNOSIS — I10 Essential (primary) hypertension: Secondary | ICD-10-CM

## 2015-10-05 DIAGNOSIS — Z1211 Encounter for screening for malignant neoplasm of colon: Secondary | ICD-10-CM

## 2015-10-05 DIAGNOSIS — M810 Age-related osteoporosis without current pathological fracture: Secondary | ICD-10-CM | POA: Diagnosis not present

## 2015-10-05 DIAGNOSIS — F1010 Alcohol abuse, uncomplicated: Secondary | ICD-10-CM | POA: Diagnosis not present

## 2015-10-05 DIAGNOSIS — F528 Other sexual dysfunction not due to a substance or known physiological condition: Secondary | ICD-10-CM

## 2015-10-05 DIAGNOSIS — Z86010 Personal history of colonic polyps: Secondary | ICD-10-CM

## 2015-10-05 MED ORDER — AMLODIPINE BESYLATE 10 MG PO TABS: ORAL_TABLET | ORAL | Status: DC

## 2015-10-05 MED ORDER — DOXAZOSIN MESYLATE 8 MG PO TABS: ORAL_TABLET | ORAL | Status: DC

## 2015-10-05 MED ORDER — SPIRONOLACTONE 25 MG PO TABS: 12.5000 mg | ORAL_TABLET | Freq: Every day | ORAL | Status: DC

## 2015-10-05 NOTE — Assessment & Plan Note (Signed)
Reviewed health habits including diet and exercise and skin cancer prevention Reviewed appropriate screening tests for age  Also reviewed health mt list, fam hx and immunization status , as well as social and family history   Rev AMW visit with Virl AxeLesia  He is not interested in colon cancer screening  Rev psa and prostate screen Declines etoh intervention for dependence  Declines dexa  Labs rev See HPI Take care of yourself  Stay active Be careful and avoid falls Continue your calcium and vitamin D We will sign you up for the cologuard test for colon cancer screening Think about decreasing alcohol or quitting completely since it is bad for your health I refilled your medicines

## 2015-10-05 NOTE — Progress Notes (Signed)
Pre visit review using our clinic review tool, if applicable. No additional management support is needed unless otherwise documented below in the visit note. 

## 2015-10-05 NOTE — Assessment & Plan Note (Signed)
Pt declines further dexa  Had 5 y of fosamax in the past  Is an alcoholic -further inc risk  No falls or fx  Understands risks   Disc need for calcium/ vitamin D/ wt bearing exercise and bone density test every 2 y to monitor Disc safety/ fracture risk in detail

## 2015-10-05 NOTE — Patient Instructions (Signed)
Take care of yourself  Stay active Be careful and avoid falls Continue your calcium and vitamin D We will sign you up for the cologuard test for colon cancer screening Think about decreasing alcohol or quitting completely since it is bad for your health I refilled your medicines

## 2015-10-05 NOTE — Assessment & Plan Note (Signed)
Given info on cologuard-not very interested

## 2015-10-05 NOTE — Assessment & Plan Note (Signed)
May be etoh related Lab Results  Component Value Date   PSA 2.58 09/30/2015   PSA 2.15 09/19/2010   PSA 2.63 06/21/2009    No change in nocturia  No urinary c/o

## 2015-10-05 NOTE — Assessment & Plan Note (Signed)
bp in fair control at this time  BP Readings from Last 1 Encounters:  10/05/15 138/66   No changes needed Disc lifstyle change with low sodium diet and exercise  Labs reviewed Enc to cut down and quit etoh

## 2015-10-05 NOTE — Assessment & Plan Note (Signed)
Pt was ref for cologuard-but he is not sure he wants to do it  Will decide and get back to us

## 2015-10-05 NOTE — Assessment & Plan Note (Addendum)
Had another disc regarding potential harms incl OP/falls/liver damage Pt has notable tremor and seems anxious today  Does not intend to cut or quit drinking Voiced understanding  Urged him to re consider  Urged him to take mvi daily along with ca and D

## 2015-10-05 NOTE — Progress Notes (Signed)
Subjective:    Patient ID: Elijah Nixon, male    DOB: 08/25/1932, 82 y.o.   MRN: [id number]  HPI Here for health maintenance exam and to review chronic medical problems    Had AMW with Lesia on 6/15 Some deficits on hearing exam-worse in the L ear  Not bothersome to him  Does not bother anyone around his  Does not want to evaluate further   utd on immunizations   11/09 colonoscopy -polyp  Did IFOB card  May ben interested in cologaurd -given info   Alcohol abuse/hx of  7-8 drinks per day - sometimes more  No intention of quitting  Does not think he can  Has not "caused any problems" -no problems with ADLS or relationships or DUIs Does not drink and drives Denies depression   Prostate-no symptoms at all  Nocturia is about 3 times (less if he does not drink before bed) Does not bother him  Lab Results  Component Value Date   PSA 2.58 09/30/2015   PSA 2.15 09/19/2010   PSA 2.63 06/21/2009    bp is stable today  No cp or palpitations or headaches or edema  No side effects to medicines  BP Readings from Last 3 Encounters:  10/05/15 138/66  09/30/15 128/62  05/05/15 132/70     Hx of OP  Declines further dexa/evaluation  Had 5 y of fosamax in the past  Takes ca and D Falls-none Fractures -none  Does drink a lot of alcohol   Results for orders placed or performed in visit on 09/30/15  CBC with Differential/Platelet  Result Value Ref Range   WBC 5.5 4.0 - 10.5 K/uL   RBC 3.96 (L) 4.22 - 5.81 Mil/uL   Hemoglobin 14.0 13.0 - 17.0 g/dL   HCT 40.9 81.1 - 91.4 %   MCV 101.6 (H) 78.0 - 100.0 fl   MCHC 34.9 30.0 - 36.0 g/dL   RDW 78.2 95.6 - 21.3 %   Platelets 292.0 150.0 - 400.0 K/uL   Neutrophils Relative % 54.9 43.0 - 77.0 %   Lymphocytes Relative 30.0 12.0 - 46.0 %   Monocytes Relative 10.1 3.0 - 12.0 %   Eosinophils Relative 3.8 0.0 - 5.0 %   Basophils Relative 1.2 0.0 - 3.0 %   Neutro Abs 3.0 1.4 - 7.7 K/uL   Lymphs Abs 1.6 0.7 - 4.0 K/uL   Monocytes  Absolute 0.6 0.1 - 1.0 K/uL   Eosinophils Absolute 0.2 0.0 - 0.7 K/uL   Basophils Absolute 0.1 0.0 - 0.1 K/uL  Comprehensive metabolic panel  Result Value Ref Range   Sodium 135 135 - 145 mEq/L   Potassium 4.1 3.5 - 5.1 mEq/L   Chloride 100 96 - 112 mEq/L   CO2 27 19 - 32 mEq/L   Glucose, Bld 95 70 - 99 mg/dL   BUN 8 6 - 23 mg/dL   Creatinine, Ser 0.86 0.40 - 1.50 mg/dL   Total Bilirubin 0.5 0.2 - 1.2 mg/dL   Alkaline Phosphatase 55 39 - 117 U/L   AST 22 0 - 37 U/L   ALT 18 0 - 53 U/L   Total Protein 7.1 6.0 - 8.3 g/dL   Albumin 4.2 3.5 - 5.2 g/dL   Calcium 9.5 8.4 - 57.8 mg/dL   GFR 46.96 >29.52 mL/min  Lipid panel  Result Value Ref Range   Cholesterol 213 (H) 0 - 200 mg/dL   Triglycerides 84.1 0.0 - 149.0 mg/dL   HDL 324.40 >10.27 mg/dL  VLDL 14.0 0.0 - 40.0 mg/dL   LDL Cholesterol 87 0 - 99 mg/dL   Total CHOL/HDL Ratio 2    NonHDL 100.91   TSH  Result Value Ref Range   TSH 1.33 0.35 - 4.50 uIU/mL  PSA  Result Value Ref Range   PSA 2.58 0.10 - 4.00 ng/mL     Good cholesterol profile with high HDL   Patient Active Problem List   Diagnosis Date Noted  . Lumbar disc disease 05/06/2015  . Leg pain, bilateral 05/05/2015  . Buttock pain 05/05/2015  . Routine general medical examination at a health care facility 10/02/2014  . Colon cancer screening 09/29/2013  . Encounter for Medicare annual wellness exam 09/27/2012  . Seborrheic keratosis 03/29/2012  . Stress reaction 09/21/2010  . SWELLING, MASS, OR LUMP IN CHEST 06/24/2009  . ERECTILE DYSFUNCTION 02/16/2009  . ACTINIC KERATOSIS 02/16/2009  . ADVERSE DRUG REACTION 11/13/2008  . Alcohol abuse 10/27/2008  . Essential hypertension 10/27/2008  . GEN OSTEOARTHROSIS INVOLVING MULTIPLE SITES 10/27/2008  . Osteoporosis 10/27/2008  . COLONIC POLYPS, HX OF 10/27/2008  . DIVERTICULITIS, HX OF 10/27/2008   Past Medical History  Diagnosis Date  . History of diverticulitis of colon   . Hypertension   . Arthritis      OA  . History of colon polyps   . DDD (degenerative disc disease)   . Osteoporosis     spinal compression fx  . Macular degeneration     dry  . AA (alcohol abuse)     at least 8-10 drinks/day  . ED (erectile dysfunction)    Past Surgical History  Procedure Laterality Date  . Spine surgery  1971    deg disc disease x 2   Social History  Substance Use Topics  . Smoking status: Never Smoker   . Smokeless tobacco: Never Used  . Alcohol Use: 0.0 oz/week    0 Standard drinks or equivalent per week     Comment: 7-8 beers per day   Family History  Problem Relation Age of Onset  . Diabetes Son    Allergies  Allergen Reactions  . Lisinopril     REACTION: increased potassium leveles   Current Outpatient Prescriptions on File Prior to Visit  Medication Sig Dispense Refill  . aspirin 81 MG tablet Take 81 mg by mouth daily.      Marland Kitchen b complex vitamins tablet Take 1 tablet by mouth daily.    . Calcium Carbonate-Vit D-Min 600-400 MG-UNIT TABS Take 1 tablet by mouth daily.     . Cyanocobalamin (VITAMIN B-12 PO) Take 1 tablet by mouth daily.    . famotidine (PEPCID) 20 MG tablet Take 20 mg by mouth as needed.     . Multiple Vitamin (MULTIVITAMIN) capsule Take 1 capsule by mouth daily.      . Multiple Vitamins-Minerals (PRESERVISION/LUTEIN) CAPS Take 1 capsule by mouth daily.      . naproxen sodium (ANAPROX) 220 MG tablet Take 220 mg by mouth daily.     No current facility-administered medications on file prior to visit.     Review of Systems Review of Systems  Constitutional: Negative for fever, appetite change, fatigue and unexpected weight change.  Eyes: Negative for pain and visual disturbance.  Respiratory: Negative for cough and shortness of breath.   Cardiovascular: Negative for cp or palpitations    Gastrointestinal: Negative for nausea, diarrhea and constipation.  Genitourinary: Negative for urgency and frequency.  Skin: Negative for pallor or rash   Neurological:  Negative  for weakness, light-headedness, numbness and headaches. pos for tremor  Hematological: Negative for adenopathy. Does not bruise/bleed easily.  Psychiatric/Behavioral: Negative for dysphoric mood. Pos for some anxiety and etoh abuse       Objective:   Physical Exam  Constitutional: He appears well-developed and well-nourished. No distress.  Frail appearing elderly male with tremor who appears anxious   HENT:  Head: Normocephalic and atraumatic.  Right Ear: External ear normal.  Left Ear: External ear normal.  Nose: Nose normal.  Mouth/Throat: Oropharynx is clear and moist.  Eyes: Conjunctivae and EOM are normal. Pupils are equal, round, and reactive to light. Right eye exhibits no discharge. Left eye exhibits no discharge. No scleral icterus.  Neck: Normal range of motion. Neck supple. No JVD present. Carotid bruit is not present. No thyromegaly present.  Cardiovascular: Normal rate, regular rhythm, normal heart sounds and intact distal pulses.  Exam reveals no gallop.   Pulmonary/Chest: Effort normal and breath sounds normal. No respiratory distress. He has no wheezes. He exhibits no tenderness.  Abdominal: Soft. Bowel sounds are normal. He exhibits no distension, no abdominal bruit and no mass. There is no hepatosplenomegaly. There is no tenderness. There is no rebound and no guarding.  Musculoskeletal: He exhibits no edema or tenderness.  Lymphadenopathy:    He has no cervical adenopathy.  Neurological: He is alert. He has normal reflexes. He displays tremor. No cranial nerve deficit. He exhibits normal muscle tone. Coordination and gait normal.  Bilateral hand tremor with a flapping quality  Skin: Skin is warm and dry. No rash noted. No erythema. No pallor.  Psychiatric: His speech is normal and behavior is normal. His mood appears anxious. Thought content is not paranoid. Cognition and memory are normal. He exhibits a depressed mood. He expresses no homicidal and no suicidal ideation.    Pt seems anxious and also mildly depressed today Seems to be in denial regarding alcohol dependence           Assessment & Plan:   Problem List Items Addressed This Visit      Cardiovascular and Mediastinum   Essential hypertension - Primary    bp in fair control at this time  BP Readings from Last 1 Encounters:  10/05/15 138/66   No changes needed Disc lifstyle change with low sodium diet and exercise  Labs reviewed Enc to cut down and quit etoh      Relevant Medications   spironolactone (ALDACTONE) 25 MG tablet   doxazosin (CARDURA) 8 MG tablet   amLODipine (NORVASC) 10 MG tablet     Musculoskeletal and Integument   Osteoporosis    Pt declines further dexa  Had 5 y of fosamax in the past  Is an alcoholic -further inc risk  No falls or fx  Understands risks   Disc need for calcium/ vitamin D/ wt bearing exercise and bone density test every 2 y to monitor Disc safety/ fracture risk in detail          Other   Routine general medical examination at a health care facility    Reviewed health habits including diet and exercise and skin cancer prevention Reviewed appropriate screening tests for age  Also reviewed health mt list, fam hx and immunization status , as well as social and family history   Rev AMW visit with Virl AxeLesia  He is not interested in colon cancer screening  Rev psa and prostate screen Declines etoh intervention for dependence  Declines dexa  Labs rev See  HPI Take care of yourself  Stay active Be careful and avoid falls Continue your calcium and vitamin D We will sign you up for the cologuard test for colon cancer screening Think about decreasing alcohol or quitting completely since it is bad for your health I refilled your medicines       ERECTILE DYSFUNCTION    May be etoh related Lab Results  Component Value Date   PSA 2.58 09/30/2015   PSA 2.15 09/19/2010   PSA 2.63 06/21/2009    No change in nocturia  No urinary c/o       COLONIC POLYPS, HX OF    Given info on cologuard-not very interested       Colon cancer screening    Pt was ref for cologuard-but he is not sure he wants to do it  Will decide and get back to [place]      Alcohol abuse    Had another disc regarding potential harms incl OP/falls/liver damage Pt has notable tremor and seems anxious today  Does not intend to cut or quit drinking Voiced understanding  Urged him to re consider  Urged him to take mvi daily along with ca and D

## 2015-11-23 ENCOUNTER — Ambulatory Visit: Payer: Medicare Other | Admitting: Family Medicine

## 2015-11-30 ENCOUNTER — Ambulatory Visit: Payer: Medicare Other | Admitting: Primary Care

## 2016-08-08 ENCOUNTER — Encounter: Payer: Self-pay | Admitting: Primary Care

## 2016-08-08 ENCOUNTER — Ambulatory Visit (INDEPENDENT_AMBULATORY_CARE_PROVIDER_SITE_OTHER): Payer: Medicare Other | Admitting: Primary Care

## 2016-08-08 VITALS — BP 140/62 | HR 78 | Temp 97.6°F | Ht 66.0 in | Wt 154.1 lb

## 2016-08-08 DIAGNOSIS — R21 Rash and other nonspecific skin eruption: Secondary | ICD-10-CM | POA: Diagnosis not present

## 2016-08-08 MED ORDER — METHYLPREDNISOLONE ACETATE 40 MG/ML IJ SUSP
80.0000 mg | Freq: Once | INTRAMUSCULAR | Status: AC
  Administered 2016-08-08: 80 mg via INTRAMUSCULAR

## 2016-08-08 MED ORDER — PREDNISONE 10 MG PO TABS: ORAL_TABLET | ORAL | 0 refills | Status: DC

## 2016-08-08 MED ORDER — METHYLPREDNISOLONE ACETATE 80 MG/ML IJ SUSP: 80.0000 mg | Freq: Once | INTRAMUSCULAR | Status: DC

## 2016-08-08 NOTE — Patient Instructions (Addendum)
You were provided with an injection of steroids today for the rash.  Start prednisone tablets tomorrow (08/09/16). Take three tablets for 3 days, then two tablets for 3 days, then one tablet for 3 days.  You may try cetirizine (Zyrtec) at bedtime for itching and rest.  Please notify me if no improvement in 3-4 days.  It was a pleasure meeting you!

## 2016-08-08 NOTE — Progress Notes (Signed)
Pre visit review using our clinic review tool, if applicable. No additional management support is needed unless otherwise documented below in the visit note. 

## 2016-08-08 NOTE — Addendum Note (Signed)
Addended by: Tawnya Crook on: 08/08/2016 12:40 PM   Modules accepted: Orders

## 2016-08-08 NOTE — Progress Notes (Signed)
Subjective:    Patient ID: FUAD FORGET, male    DOB: December 06, 1932, 83 y.o.   MRN: [id number]  HPI  Mr. Trickey is an 83 year old male who presents today with a chief complaint of rash. His rash is located to the anterior chest, abdomen, posterior chest, buttocks, and bilateral thighs. He first noticed the rash 1 month ago. His rash is itchy in nature. He's been using hydrocortisone cream and OTC cream without much improvement. No one else in his household is itching. His wife changed his laundry detergent 1 week after his rash began as she thought this may be the cause, but no improvement. He denies pets with fleas, changes in medications, changes in food, shortness of breath, throat tightness.   Review of Systems  Constitutional: Negative for fever.  HENT: Negative for sore throat.   Respiratory: Negative for shortness of breath.   Cardiovascular: Negative for chest pain.  Skin: Positive for wound.       Past Medical History:  Diagnosis Date  . AA (alcohol abuse)    at least 8-10 drinks/day  . Arthritis    OA  . DDD (degenerative disc disease)   . ED (erectile dysfunction)   . History of colon polyps   . History of diverticulitis of colon   . Hypertension   . Macular degeneration    dry  . Osteoporosis    spinal compression fx     Social History   Social History  . Marital status: Married    Spouse name: N/A  . Number of children: N/A  . Years of education: N/A   Occupational History  . Not on file.   Social History Main Topics  . Smoking status: Never Smoker  . Smokeless tobacco: Never Used  . Alcohol use 0.0 oz/week     Comment: 7-8 beers per day  . Drug use: No  . Sexual activity: No   Other Topics Concern  . Not on file   Social History Narrative  . No narrative on file    Past Surgical History:  Procedure Laterality Date  . SPINE SURGERY  1971   deg disc disease x 2    Family History  Problem Relation Age of Onset  . Diabetes Son      Allergies  Allergen Reactions  . Lisinopril     REACTION: increased potassium leveles    Current Outpatient Prescriptions on File Prior to Visit  Medication Sig Dispense Refill  . amLODipine (NORVASC) 10 MG tablet TAKE 1 TABLET BY MOUTH DAILY 90 tablet 3  . aspirin 81 MG tablet Take 81 mg by mouth daily.      Marland Kitchen b complex vitamins tablet Take 1 tablet by mouth daily.    . Calcium Carbonate-Vit D-Min 600-400 MG-UNIT TABS Take 1 tablet by mouth daily.     . Cyanocobalamin (VITAMIN B-12 PO) Take 1 tablet by mouth daily.    Marland Kitchen doxazosin (CARDURA) 8 MG tablet TAKE ONE (1) TABLET BY MOUTH EVERY DAY 90 tablet 3  . famotidine (PEPCID) 20 MG tablet Take 20 mg by mouth as needed.     . Multiple Vitamin (MULTIVITAMIN) capsule Take 1 capsule by mouth daily.      . Multiple Vitamins-Minerals (PRESERVISION/LUTEIN) CAPS Take 1 capsule by mouth daily.      . naproxen sodium (ANAPROX) 220 MG tablet Take 220 mg by mouth daily.    Marland Kitchen spironolactone (ALDACTONE) 25 MG tablet Take 0.5 tablets (12.5 mg total) by mouth  daily. 90 tablet 3   No current facility-administered medications on file prior to visit.     BP 140/62   Pulse 78   Temp 97.6 F (36.4 C) (Oral)   Ht  (1.676 m)   Wt 154 lb 1.9 oz (69.9 kg)   SpO2 98%   BMI 24.88 kg/m    Objective:   Physical Exam  Constitutional: He appears well-nourished.  Neck: Neck supple.  Cardiovascular: Normal rate and regular rhythm.   Pulmonary/Chest: Effort normal and breath sounds normal. He has no wheezes.  Skin: Skin is warm and dry.  Widespread raised red rash to buttocks, upper extremities, bilateral thighs, anterior and posterior chest. No bites between the fingers.          Assessment & Plan:  Rash:  Initially began 1 month ago.  Exam today with widespread rash as mentioned above. Does not appear to be hives, allergic reaction, bedbugs, poison ivy/oak. Since no one else in his house is itching, this must be some sort of contact  dermatitis.  Treat today with IM Depo Medrol 80. Rx for low dose prednisone course sent to pharmacy. Discussed Zyrtec HS PRN. He will update if no improvement.  Morrie Sheldon, NP

## 2016-08-21 ENCOUNTER — Encounter: Payer: Self-pay | Admitting: Primary Care

## 2016-08-21 ENCOUNTER — Ambulatory Visit (INDEPENDENT_AMBULATORY_CARE_PROVIDER_SITE_OTHER): Payer: Medicare Other | Admitting: Primary Care

## 2016-08-21 ENCOUNTER — Telehealth: Payer: Self-pay | Admitting: Family Medicine

## 2016-08-21 VITALS — BP 140/66 | HR 79 | Temp 97.5°F | Ht 66.0 in | Wt 151.1 lb

## 2016-08-21 DIAGNOSIS — R21 Rash and other nonspecific skin eruption: Secondary | ICD-10-CM

## 2016-08-21 MED ORDER — PERMETHRIN 5 % EX CREA: TOPICAL_CREAM | CUTANEOUS | 0 refills | Status: DC

## 2016-08-21 NOTE — Telephone Encounter (Signed)
Fine with me, thank you! ?

## 2016-08-21 NOTE — Progress Notes (Signed)
Subjective:    Patient ID: Elijah Nixon, male    DOB: [birth year], 83 y.o.   MRN: [id number]  HPI  Mr. Hackenberg is an 83 year old male who presents today with a chief complaint of rash. He was evaluated on 08/08/16 with a one month history of nearly full body rash. He was treated with IM Depo Medrol and sent home with a prescription for low dose prednisone taper.   Since his last visit his rash is still present. He did notice slight improvement for 4-5 days during prednisone course, but his rash is back to where it was during his prior visit. His rash is itchy in nature.  His wife has no symptoms. He denies changes in medications, food, soaps/detergents. He's taken two doses of Zyrtec since his last visit.   Review of Systems  HENT: Negative for trouble swallowing.   Respiratory: Negative for shortness of breath and wheezing.   Skin: Positive for rash.       Past Medical History:  Diagnosis Date  . AA (alcohol abuse)    at least 8-10 drinks/day  . Arthritis    OA  . DDD (degenerative disc disease)   . ED (erectile dysfunction)   . History of colon polyps   . History of diverticulitis of colon   . Hypertension   . Macular degeneration    dry  . Osteoporosis    spinal compression fx     Social History   Social History  . Marital status: Married    Spouse name: N/A  . Number of children: N/A  . Years of education: N/A   Occupational History  . Not on file.   Social History Main Topics  . Smoking status: Never Smoker  . Smokeless tobacco: Never Used  . Alcohol use 0.0 oz/week     Comment: 7-8 beers per day  . Drug use: No  . Sexual activity: No   Other Topics Concern  . Not on file   Social History Narrative  . No narrative on file    Past Surgical History:  Procedure Laterality Date  . SPINE SURGERY  1971   deg disc disease x 2    Family History  Problem Relation Age of Onset  . Diabetes Son     Allergies  Allergen Reactions  . Lisinopril     REACTION: increased potassium leveles    Current Outpatient Prescriptions on File Prior to Visit  Medication Sig Dispense Refill  . amLODipine (NORVASC) 10 MG tablet TAKE 1 TABLET BY MOUTH DAILY 90 tablet 3  . aspirin 81 MG tablet Take 81 mg by mouth daily.      Marland Kitchen b complex vitamins tablet Take 1 tablet by mouth daily.    . Calcium Carbonate-Vit D-Min 600-400 MG-UNIT TABS Take 1 tablet by mouth daily.     . Cyanocobalamin (VITAMIN B-12 PO) Take 1 tablet by mouth daily.    Marland Kitchen doxazosin (CARDURA) 8 MG tablet TAKE ONE (1) TABLET BY MOUTH EVERY DAY 90 tablet 3  . famotidine (PEPCID) 20 MG tablet Take 20 mg by mouth as needed.     . Multiple Vitamin (MULTIVITAMIN) capsule Take 1 capsule by mouth daily.      . Multiple Vitamins-Minerals (PRESERVISION/LUTEIN) CAPS Take 1 capsule by mouth daily.      . naproxen sodium (ANAPROX) 220 MG tablet Take 220 mg by mouth daily.    Marland Kitchen spironolactone (ALDACTONE) 25 MG tablet Take 0.5 tablets (12.5 mg total) by mouth  daily. 90 tablet 3   No current facility-administered medications on file prior to visit.     BP 140/66   Pulse 79   Temp 97.5 F (36.4 C) (Oral)   Ht 5\' 6"  (1.676 m)   Wt 151 lb 1.9 oz (68.5 kg)   SpO2 98%   BMI 24.39 kg/m    Objective:   Physical Exam  Constitutional: He appears well-nourished.  Neck: Neck supple.  Cardiovascular: Normal rate and regular rhythm.   Pulmonary/Chest: Effort normal and breath sounds normal.  Skin: Skin is warm and dry.  Maculopapular, periodic rash to anterior and posterior trunk, bilateral upper extremities, groin, lower external nares. No burrowing between the webs of his fingertips.          Assessment & Plan:  Rash:  Very slight and temporary improvement with low-dose prednisone course. Exam today suggestive not suggestive of allergy-induced rash, shingles, poison oak/ivy dermatitis. Given little to no improvement with steroids, will treat with permethrin cream, although suspicion for  bedbugs/scabies is low. If no improvement/resolve and will send to dermatology for further evaluation.  Morrie Sheldonlark,Kaylin Marcon Kendal, NP

## 2016-08-21 NOTE — Progress Notes (Signed)
Pre visit review using our clinic review tool, if applicable. No additional management support is needed unless otherwise documented below in the visit note. 

## 2016-08-21 NOTE — Telephone Encounter (Signed)
That is fine with me if ok with her  [facility]

## 2016-08-21 NOTE — Telephone Encounter (Signed)
Pt requesting to change PCP from Dr. Milinda Antisower to Mayra ReelKate Clark. States he's more comfortable with Jae DireKate.

## 2016-08-21 NOTE — Patient Instructions (Signed)
Start permethrin cream for rash.  Apply 1/2 of the tube from your neck down to your toes. Leave this on for 8 hours, then wash off in the shower.  Start taking famotidine (Pepcid) once daily. Resume use of your Zyrtec once nightly at bedtime.  Please call me if no improvement by Friday this week.  It was a pleasure to see you today!

## 2016-08-23 NOTE — Telephone Encounter (Signed)
Aware, thanks!

## 2016-08-23 NOTE — Telephone Encounter (Signed)
Noted  

## 2016-08-23 NOTE — Telephone Encounter (Signed)
Pt called stating he decided not to switch because he's been established with Dr. Milinda Antisower for so long.

## 2016-08-30 ENCOUNTER — Encounter: Payer: Self-pay | Admitting: Family Medicine

## 2016-08-30 ENCOUNTER — Ambulatory Visit (INDEPENDENT_AMBULATORY_CARE_PROVIDER_SITE_OTHER): Payer: Medicare Other | Admitting: Family Medicine

## 2016-08-30 VITALS — BP 138/66 | HR 76 | Temp 97.7°F | Wt 154.0 lb

## 2016-08-30 DIAGNOSIS — R21 Rash and other nonspecific skin eruption: Secondary | ICD-10-CM | POA: Insufficient documentation

## 2016-08-30 MED ORDER — HYDROXYZINE HCL 25 MG PO TABS: 12.5000 mg | ORAL_TABLET | Freq: Every evening | ORAL | 1 refills | Status: DC | PRN

## 2016-08-30 NOTE — Progress Notes (Signed)
Subjective:    Patient ID: Elijah Nixon, male    DOB: [birth year], 83 y.o.   MRN: [id number]  HPI Here for a rash   Has taken prednisone Also permethrin cream   States no improvement   Changed products to "free and clear"  Wt Readings from Last 3 Encounters:  08/30/16 154 lb (69.9 kg)  08/21/16 151 lb 1.9 oz (68.5 kg)  08/08/16 154 lb 1.9 oz (69.9 kg)    Thinks the prednisone helped for short period  No improvement at all with permetherin cream  Rash is on his trunk  Stops before hands and feet  Back itches the worst  otc cortisone cream helps a bit    None of his medicines are new  Rash started 4-5 weeks   No tick or insect bites  Feeling fine-no fever or constitutional symptoms   No new foods   Taking zyrtec   Patient Active Problem List   Diagnosis Date Noted  . Rash and nonspecific skin eruption 08/30/2016  . Lumbar disc disease 05/06/2015  . Leg pain, bilateral 05/05/2015  . Buttock pain 05/05/2015  . Routine general medical examination at a health care facility 10/02/2014  . Colon cancer screening 09/29/2013  . Encounter for Medicare annual wellness exam 09/27/2012  . Seborrheic keratosis 03/29/2012  . Stress reaction 09/21/2010  . SWELLING, MASS, OR LUMP IN CHEST 06/24/2009  . ERECTILE DYSFUNCTION 02/16/2009  . ACTINIC KERATOSIS 02/16/2009  . ADVERSE DRUG REACTION 11/13/2008  . Alcohol abuse 10/27/2008  . Essential hypertension 10/27/2008  . GEN OSTEOARTHROSIS INVOLVING MULTIPLE SITES 10/27/2008  . Osteoporosis 10/27/2008  . COLONIC POLYPS, HX OF 10/27/2008  . DIVERTICULITIS, HX OF 10/27/2008   Past Medical History:  Diagnosis Date  . AA (alcohol abuse)    at least 8-10 drinks/day  . Arthritis    OA  . DDD (degenerative disc disease)   . ED (erectile dysfunction)   . History of colon polyps   . History of diverticulitis of colon   . Hypertension   . Macular degeneration    dry  . Osteoporosis    spinal compression fx   Past  Surgical History:  Procedure Laterality Date  . SPINE SURGERY  1971   deg disc disease x 2   Social History  Substance Use Topics  . Smoking status: Never Smoker  . Smokeless tobacco: Never Used  . Alcohol use 0.0 oz/week     Comment: 7-8 beers per day   Family History  Problem Relation Age of Onset  . Diabetes Son    Allergies  Allergen Reactions  . Lisinopril     REACTION: increased potassium leveles   Current Outpatient Prescriptions on File Prior to Visit  Medication Sig Dispense Refill  . amLODipine (NORVASC) 10 MG tablet TAKE 1 TABLET BY MOUTH DAILY 90 tablet 3  . aspirin 81 MG tablet Take 81 mg by mouth daily.      Marland Kitchen b complex vitamins tablet Take 1 tablet by mouth daily.    . Calcium Carbonate-Vit D-Min 600-400 MG-UNIT TABS Take 1 tablet by mouth daily.     . Cyanocobalamin (VITAMIN B-12 PO) Take 1 tablet by mouth daily.    Marland Kitchen doxazosin (CARDURA) 8 MG tablet TAKE ONE (1) TABLET BY MOUTH EVERY DAY 90 tablet 3  . famotidine (PEPCID) 20 MG tablet Take 20 mg by mouth as needed.     . Multiple Vitamins-Minerals (PRESERVISION/LUTEIN) CAPS Take 1 capsule by mouth daily.      Marland Kitchen  naproxen sodium (ANAPROX) 220 MG tablet Take 220 mg by mouth daily.    Marland Kitchen. spironolactone (ALDACTONE) 25 MG tablet Take 0.5 tablets (12.5 mg total) by mouth daily. 90 tablet 3   No current facility-administered medications on file prior to visit.      Review of Systems Review of Systems  Constitutional: Negative for fever, appetite change, fatigue and unexpected weight change.  Eyes: Negative for pain and visual disturbance.  Respiratory: Negative for cough and shortness of breath.   Cardiovascular: Negative for cp or palpitations    Gastrointestinal: Negative for nausea, diarrhea and constipation.  Genitourinary: Negative for urgency and frequency.  Skin: pos for itchy rash on trunk  Neurological: Negative for weakness, light-headedness, numbness and headaches.  Hematological: Negative for  adenopathy. Does not bruise/bleed easily.  Psychiatric/Behavioral: Negative for dysphoric mood. The patient is not nervous/anxious.         Objective:   Physical Exam  Constitutional: He appears well-developed and well-nourished. No distress.  HENT:  Head: Normocephalic and atraumatic.  Mouth/Throat: Oropharynx is clear and moist. No oropharyngeal exudate.  Eyes: Conjunctivae and EOM are normal. Pupils are equal, round, and reactive to light. Right eye exhibits no discharge. Left eye exhibits no discharge. No scleral icterus.  Neck: Normal range of motion. Neck supple.  Cardiovascular: Normal rate and regular rhythm.   Pulmonary/Chest: Effort normal and breath sounds normal. No respiratory distress. He has no wheezes.  Abdominal: Soft. Bowel sounds are normal. He exhibits no distension and no mass. There is no tenderness.  Musculoskeletal: He exhibits no edema.  Lymphadenopathy:    He has no cervical adenopathy.  Neurological: He is alert. No cranial nerve deficit. He exhibits normal muscle tone. Coordination normal.  Skin: Skin is warm and dry. Rash noted.  Maculopapular rash on trunk and proximal ext Worse in buttock area  Resembles allergic rash No skin injury or interruption  No insect bites No scale   Psychiatric:  Mildly anxious  Supportive wife present          Assessment & Plan:   Problem List Items Addressed This Visit      Musculoskeletal and Integument   Rash and nonspecific skin eruption    Maculopapular rash on trunk and proximal extremities -sparing head/neck/scalp/hands/feet and mucosal surfaces No urticaria Resembles allergic rash  Unsure of cause - ? Drug/food/or other  slt imp with prednisone No imp with permetherin  Stopping use of fragrances Will hold all supplements  Change soap to dove for sens skin  Hydroxyzine for itch at bedtime, zyrtec daily  Enc to stay cool Derm referral made       Relevant Orders   Ambulatory referral to  Dermatology

## 2016-08-30 NOTE — Patient Instructions (Addendum)
Keep taking zyrtec 10 mg once daily  Hydroxyzine 25 mg (1/2 to 1 pill at bedtime-also for itching) Keep cool Stop all of your supplements for now  Change soap to dove for sensitive skin  We will place an order for dermatology   We will call you about your referral

## 2016-08-31 NOTE — Assessment & Plan Note (Signed)
Maculopapular rash on trunk and proximal extremities -sparing head/neck/scalp/hands/feet and mucosal surfaces No urticaria Resembles allergic rash  Unsure of cause - ? Drug/food/or other  slt imp with prednisone No imp with permetherin  Stopping use of fragrances Will hold all supplements  Change soap to dove for sens skin  Hydroxyzine for itch at bedtime, zyrtec daily  Enc to stay cool Derm referral made

## 2016-10-07 ENCOUNTER — Telehealth: Payer: Self-pay | Admitting: Family Medicine

## 2016-10-07 DIAGNOSIS — Z0000 Encounter for general adult medical examination without abnormal findings: Secondary | ICD-10-CM

## 2016-10-07 DIAGNOSIS — N400 Enlarged prostate without lower urinary tract symptoms: Secondary | ICD-10-CM | POA: Insufficient documentation

## 2016-10-07 DIAGNOSIS — F528 Other sexual dysfunction not due to a substance or known physiological condition: Secondary | ICD-10-CM

## 2016-10-07 DIAGNOSIS — Z125 Encounter for screening for malignant neoplasm of prostate: Secondary | ICD-10-CM

## 2016-10-07 NOTE — Telephone Encounter (Signed)
-----   Message from Robert Bellowarlesia R Pinson, LPN sent at 6/57/84696/22/2018  4:01 PM EDT ----- [facility]  [facility] lab orders.

## 2016-10-09 ENCOUNTER — Other Ambulatory Visit: Payer: Medicare Other

## 2016-10-09 ENCOUNTER — Ambulatory Visit (INDEPENDENT_AMBULATORY_CARE_PROVIDER_SITE_OTHER): Payer: Medicare Other

## 2016-10-09 VITALS — BP 120/66 | HR 78 | Temp 97.6°F | Ht 65.0 in | Wt 146.5 lb

## 2016-10-09 DIAGNOSIS — Z125 Encounter for screening for malignant neoplasm of prostate: Secondary | ICD-10-CM | POA: Diagnosis not present

## 2016-10-09 DIAGNOSIS — F528 Other sexual dysfunction not due to a substance or known physiological condition: Secondary | ICD-10-CM

## 2016-10-09 DIAGNOSIS — Z0000 Encounter for general adult medical examination without abnormal findings: Secondary | ICD-10-CM

## 2016-10-09 LAB — CBC WITH DIFFERENTIAL/PLATELET
BASOS PCT: 1.2 % (ref 0.0–3.0)
Basophils Absolute: 0.1 10*3/uL (ref 0.0–0.1)
EOS PCT: 7.3 % — AB (ref 0.0–5.0)
Eosinophils Absolute: 0.5 10*3/uL (ref 0.0–0.7)
HCT: 36.2 % — ABNORMAL LOW (ref 39.0–52.0)
Hemoglobin: 12.7 g/dL — ABNORMAL LOW (ref 13.0–17.0)
LYMPHS ABS: 1.5 10*3/uL (ref 0.7–4.0)
Lymphocytes Relative: 24 % (ref 12.0–46.0)
MCHC: 35.1 g/dL (ref 30.0–36.0)
MCV: 91.9 fl (ref 78.0–100.0)
MONO ABS: 0.7 10*3/uL (ref 0.1–1.0)
MONOS PCT: 11.3 % (ref 3.0–12.0)
NEUTROS ABS: 3.6 10*3/uL (ref 1.4–7.7)
NEUTROS PCT: 56.2 % (ref 43.0–77.0)
Platelets: 332 10*3/uL (ref 150.0–400.0)
RBC: 3.94 Mil/uL — AB (ref 4.22–5.81)
RDW: 18.4 % — ABNORMAL HIGH (ref 11.5–15.5)
WBC: 6.3 10*3/uL (ref 4.0–10.5)

## 2016-10-09 LAB — COMPREHENSIVE METABOLIC PANEL
ALK PHOS: 76 U/L (ref 39–117)
ALT: 18 U/L (ref 0–53)
AST: 20 U/L (ref 0–37)
Albumin: 4 g/dL (ref 3.5–5.2)
BUN: 8 mg/dL (ref 6–23)
CHLORIDE: 99 meq/L (ref 96–112)
CO2: 30 mEq/L (ref 19–32)
Calcium: 9.4 mg/dL (ref 8.4–10.5)
Creatinine, Ser: 0.88 mg/dL (ref 0.40–1.50)
GFR: 88.16 mL/min (ref >60.00)
GLUCOSE: 110 mg/dL — AB (ref 70–99)
POTASSIUM: 4.7 meq/L (ref 3.5–5.1)
SODIUM: 135 meq/L (ref 135–145)
TOTAL PROTEIN: 6.6 g/dL (ref 6.0–8.3)
Total Bilirubin: 0.4 mg/dL (ref 0.2–1.2)

## 2016-10-09 LAB — LIPID PANEL
CHOLESTEROL: 185 mg/dL (ref 0–200)
HDL: 110.6 mg/dL (ref >39.00)
LDL CALC: 68 mg/dL (ref 0–99)
NONHDL: 74.34
Total CHOL/HDL Ratio: 2
Triglycerides: 33 mg/dL (ref 0.0–149.0)
VLDL: 6.6 mg/dL (ref 0.0–40.0)

## 2016-10-09 LAB — TSH: TSH: 2.3 u[IU]/mL (ref 0.35–4.50)

## 2016-10-09 LAB — PSA, MEDICARE: PSA: 2.95 ng/ml (ref 0.10–4.00)

## 2016-10-09 NOTE — Patient Instructions (Addendum)
Elijah Nixon , Thank you for taking time to come for your Medicare Wellness Visit. I appreciate your ongoing commitment to your health goals. Please review the following plan we discussed and let me know if I can assist you in the future.   These are the goals we discussed: Goals    . Increase water intake          Starting 10/09/2016, I will attempt to drink at least 6 glasses of water daily.        This is a list of the screening recommended for you and due dates:  Health Maintenance  Topic Date Due  . Flu Shot  11/15/2016  . Tetanus Vaccine  04/17/2018  . Pneumonia vaccines  Completed   Preventive Care for Adults  A healthy lifestyle and preventive care can promote health and wellness. Preventive health guidelines for adults include the following key practices.  . A routine yearly physical is a good way to check with your health care provider about your health and preventive screening. It is a chance to share any concerns and updates on your health and to receive a thorough exam.  . Visit your dentist for a routine exam and preventive care every 6 months. Brush your teeth twice a day and floss once a day. Good oral hygiene prevents tooth decay and gum disease.  . The frequency of eye exams is based on your age, health, family medical history, use  of contact lenses, and other factors. Follow your health care provider's ecommendations for frequency of eye exams.  . Eat a healthy diet. Foods like vegetables, fruits, whole grains, low-fat dairy products, and lean protein foods contain the nutrients you need without too many calories. Decrease your intake of foods high in solid fats, added sugars, and salt. Eat the right amount of calories for you. Get information about a proper diet from your health care provider, if necessary.  . Regular physical exercise is one of the most important things you can do for your health. Most adults should get at least 150 minutes of moderate-intensity  exercise (any activity that increases your heart rate and causes you to sweat) each week. In addition, most adults need muscle-strengthening exercises on 2 or more days a week.  Silver Sneakers may be a benefit available to you. To determine eligibility, you may visit the website: www.silversneakers.com or contact program at (908)654-1141 Mon-Fri between 8AM-8PM.   . Maintain a healthy weight. The body mass index (BMI) is a screening tool to identify possible weight problems. It provides an estimate of body fat based on height and weight. Your health care provider can find your BMI and can help you achieve or maintain a healthy weight.   For adults 20 years and older: ? A BMI below 18.5 is considered underweight. ? A BMI of 18.5 to 24.9 is normal. ? A BMI of 25 to 29.9 is considered overweight. ? A BMI of 30 and above is considered obese.   . Maintain normal blood lipids and cholesterol levels by exercising and minimizing your intake of saturated fat. Eat a balanced diet with plenty of fruit and vegetables. Blood tests for lipids and cholesterol should begin at age 48 and be repeated every 5 years. If your lipid or cholesterol levels are high, you are over 50, or you are at high risk for heart disease, you may need your cholesterol levels checked more frequently. Ongoing high lipid and cholesterol levels should be treated with medicines if diet and  exercise are not working.  . If you smoke, find out from your health care provider how to quit. If you do not use tobacco, please do not start.  . If you choose to drink alcohol, please do not consume more than 2 drinks per day. One drink is considered to be 12 ounces (355 mL) of beer, 5 ounces (148 mL) of wine, or 1.5 ounces (44 mL) of liquor.  . If you are 5055-81 years old, ask your health care provider if you should take aspirin to prevent strokes.  . Use sunscreen. Apply sunscreen liberally and repeatedly throughout the day. You should seek shade  when your shadow is shorter than you. Protect yourself by wearing long sleeves, pants, a wide-brimmed hat, and sunglasses year round, whenever you are outdoors.  . Once a month, do a whole body skin exam, using a mirror to look at the skin on your back. Tell your health care provider of new moles, moles that have irregular borders, moles that are larger than a pencil eraser, or moles that have changed in shape or color.

## 2016-10-09 NOTE — Progress Notes (Signed)
Subjective:   Elijah Nixon is a 83 y.o. male who presents for Medicare Annual/Subsequent preventive examination.  Review of Systems:  N/A Cardiac Risk Factors include: advanced age (>51men, >33 women);hypertension     Objective:    Vitals: BP 120/66 (BP Location: Right Arm, Patient Position: Sitting, Cuff Size: Normal)   Pulse 78   Temp 97.6 F (36.4 C) (Oral)   Ht 5\' 5"  (1.651 m) Comment: no shoes  Wt 146 lb 8 oz (66.5 kg)   SpO2 98%   BMI 24.38 kg/m   Body mass index is 24.38 kg/m.  Tobacco History  Smoking Status  . Never Smoker  Smokeless Tobacco  . Never Used     Counseling given: No   Past Medical History:  Diagnosis Date  . AA (alcohol abuse)    at least 8-10 drinks/day  . Arthritis    OA  . DDD (degenerative disc disease)   . ED (erectile dysfunction)   . History of colon polyps   . History of diverticulitis of colon   . Hypertension   . Macular degeneration    dry  . Osteoporosis    spinal compression fx   Past Surgical History:  Procedure Laterality Date  . SPINE SURGERY  1971   deg disc disease x 2   Family History  Problem Relation Age of Onset  . Diabetes Son    History  Sexual Activity  . Sexual activity: No    Outpatient Encounter Prescriptions as of 10/09/2016  Medication Sig  . amLODipine (NORVASC) 10 MG tablet TAKE 1 TABLET BY MOUTH DAILY  . aspirin 81 MG tablet Take 81 mg by mouth daily.    Marland Kitchen b complex vitamins tablet Take 1 tablet by mouth daily.  . Calcium Carbonate-Vit D-Min 600-400 MG-UNIT TABS Take 1 tablet by mouth daily.   . Cyanocobalamin (VITAMIN B-12 PO) Take 1 tablet by mouth daily.  Marland Kitchen doxazosin (CARDURA) 8 MG tablet TAKE ONE (1) TABLET BY MOUTH EVERY DAY  . Multiple Vitamins-Minerals (PRESERVISION/LUTEIN) CAPS Take 1 capsule by mouth daily.    . naproxen sodium (ANAPROX) 220 MG tablet Take 220 mg by mouth as needed.   Marland Kitchen spironolactone (ALDACTONE) 25 MG tablet Take 0.5 tablets (12.5 mg total) by mouth daily.    . famotidine (PEPCID) 20 MG tablet Take 20 mg by mouth as needed.   . hydrOXYzine (ATARAX/VISTARIL) 25 MG tablet Take 0.5-1 tablets (12.5-25 mg total) by mouth at bedtime as needed for itching. (Patient not taking: Reported on 10/09/2016)   No facility-administered encounter medications on file as of 10/09/2016.     Activities of Daily Living In your present state of health, do you have any difficulty performing the following activities: 10/09/2016  Hearing? Y  Vision? Y  Difficulty concentrating or making decisions? Y  Walking or climbing stairs? N  Dressing or bathing? N  Doing errands, shopping? Y  Preparing Food and eating ? N  Using the Toilet? N  In the past six months, have you accidently leaked urine? N  Do you have problems with loss of bowel control? N  Managing your Medications? N  Managing your Finances? N  Housekeeping or managing your Housekeeping? N  Some recent data might be hidden    Patient Care Team: Tower, Audrie Gallus, MD as PCP - General Rankin, Alford Highland, MD as Consulting Physician (Ophthalmology) [facility] as Referring Physician (Orthopedic Surgery) Olivia Canter, MD as Consulting Physician (Ophthalmology)   Assessment:  Hearing Screening   125Hz  250Hz  500Hz  1000Hz  2000Hz  3000Hz  4000Hz  6000Hz  8000Hz   Right ear:   0 0 40  0    Left ear:   0 0 0  0    Vision Screening Comments: Last vision exam in May 2018 with Dr. Dione BoozeGroat   Exercise Activities and Dietary recommendations Current Exercise Habits: The patient does not participate in regular exercise at present, Exercise limited by: None identified  Goals    . Increase water intake          Starting 10/09/2016, I will attempt to drink at least 6 glasses of water daily.       Fall Risk Fall Risk  10/09/2016 09/30/2015 10/02/2014 09/29/2013 09/27/2012  Falls in the past year? No No No No No   Depression Screen PHQ 2/9 Scores 10/09/2016 09/30/2015 10/02/2014 09/29/2013  PHQ - 2  Score 0 0 0 0    Cognitive Function MMSE - Mini Mental State Exam 10/09/2016 09/30/2015  Orientation to time 5 5  Orientation to Place 5 5  Registration 3 3  Attention/ Calculation 0 0  Recall 3 3  Language- name 2 objects 0 0  Language- repeat 1 1  Language- follow 3 step command 2 3  Language- follow 3 step command-comments pt was unable to follow 1 step of 3 step command -  Language- read & follow direction 0 0  Write a sentence 0 0  Copy design 0 0  Total score 19 20     PLEASE NOTE: A Mini-Cog screen was completed. Maximum score is 20. A value of 0 denotes this part of Folstein MMSE was not completed or the patient failed this part of the Mini-Cog screening.   Mini-Cog Screening Orientation to Time - Max 5 pts Orientation to Place - Max 5 pts Registration - Max 3 pts Recall - Max 3 pts Language Repeat - Max 1 pts Language Follow 3 Step Command - Max 3 pts     Immunization History  Administered Date(s) Administered  . Influenza Split 02/10/2011, 03/29/2012, 02/10/2014  . Influenza Whole 02/16/2009, 01/05/2010  . Influenza, Seasonal, Injecte, Preservative Fre 02/04/2016  . Influenza,inj,Quad PF,6-35 Mos 12/24/2012  . Influenza-Unspecified 02/23/2015  . Pneumococcal Conjugate-13 09/29/2013  . Pneumococcal Polysaccharide-23 04/17/2004  . Td 04/17/2008  . Zoster 07/17/2003   Screening Tests Health Maintenance  Topic Date Due  . INFLUENZA VACCINE  11/15/2016  . TETANUS/TDAP  04/17/2018  . PNA vac Low Risk Adult  Completed      Plan:  I have personally reviewed and addressed the Medicare Annual Wellness questionnaire and have noted the following in the patient's chart:  A. Medical and social history B. Use of alcohol, tobacco or illicit drugs  C. Current medications and supplements D. Functional ability and status E.  Nutritional status F.  Physical activity G. Advance directives H. List of other physicians I.  Hospitalizations, surgeries, and ER visits in  previous 12 months J.  Vitals K. Screenings to include hearing, vision, cognitive, depression L. Referrals and appointments - none  In addition, I have reviewed and discussed with patient certain preventive protocols, quality metrics, and best practice recommendations. A written personalized care plan for preventive services as well as general preventive health recommendations were provided to patient.  See attached scanned questionnaire for additional information.   Signed,   Randa EvensLesia Pinson, MHA, BS, LPN Health Coach

## 2016-10-09 NOTE — Progress Notes (Signed)
PCP notes:   Health maintenance:  No gaps identified.   Abnormal screenings:   Hearing - failed Mini-Cog score: 19/20  Patient concerns:   None  Nurse concerns:  None  Next PCP appt:   10/17/16 @ 1130

## 2016-10-09 NOTE — Progress Notes (Signed)
Pre visit review using our clinic review tool, if applicable. No additional management support is needed unless otherwise documented below in the visit note. 

## 2016-10-09 NOTE — Progress Notes (Signed)
Medical screening examination/treatment/procedure(s) were performed by registered nurse and as supervising non-physician practitioner, I was immediately available for consultation/collaboration.   BAITY, REGINA, NP  

## 2016-10-13 ENCOUNTER — Encounter: Payer: Medicare Other | Admitting: Family Medicine

## 2016-10-17 ENCOUNTER — Encounter: Payer: Self-pay | Admitting: Family Medicine

## 2016-10-17 ENCOUNTER — Ambulatory Visit (INDEPENDENT_AMBULATORY_CARE_PROVIDER_SITE_OTHER): Payer: Medicare Other | Admitting: Family Medicine

## 2016-10-17 VITALS — BP 122/66 | HR 75 | Temp 97.6°F | Ht 65.0 in | Wt 150.5 lb

## 2016-10-17 DIAGNOSIS — R7303 Prediabetes: Secondary | ICD-10-CM

## 2016-10-17 DIAGNOSIS — R21 Rash and other nonspecific skin eruption: Secondary | ICD-10-CM | POA: Diagnosis not present

## 2016-10-17 DIAGNOSIS — D649 Anemia, unspecified: Secondary | ICD-10-CM | POA: Diagnosis not present

## 2016-10-17 DIAGNOSIS — F1010 Alcohol abuse, uncomplicated: Secondary | ICD-10-CM

## 2016-10-17 DIAGNOSIS — M810 Age-related osteoporosis without current pathological fracture: Secondary | ICD-10-CM | POA: Diagnosis not present

## 2016-10-17 DIAGNOSIS — L111 Transient acantholytic dermatosis [Grover]: Secondary | ICD-10-CM | POA: Diagnosis not present

## 2016-10-17 DIAGNOSIS — N401 Enlarged prostate with lower urinary tract symptoms: Secondary | ICD-10-CM

## 2016-10-17 DIAGNOSIS — I10 Essential (primary) hypertension: Secondary | ICD-10-CM | POA: Diagnosis not present

## 2016-10-17 DIAGNOSIS — R350 Frequency of micturition: Secondary | ICD-10-CM

## 2016-10-17 DIAGNOSIS — Z0001 Encounter for general adult medical examination with abnormal findings: Secondary | ICD-10-CM

## 2016-10-17 DIAGNOSIS — Z0000 Encounter for general adult medical examination without abnormal findings: Secondary | ICD-10-CM

## 2016-10-17 MED ORDER — AMLODIPINE BESYLATE 10 MG PO TABS: ORAL_TABLET | ORAL | 3 refills | Status: DC

## 2016-10-17 MED ORDER — DOXAZOSIN MESYLATE 8 MG PO TABS: ORAL_TABLET | ORAL | 3 refills | Status: DC

## 2016-10-17 MED ORDER — SPIRONOLACTONE 25 MG PO TABS: 12.5000 mg | ORAL_TABLET | Freq: Every day | ORAL | 3 refills | Status: DC

## 2016-10-17 NOTE — Assessment & Plan Note (Signed)
New HB down from 14 to 12.7 Pt declines w/u of any kind  Denies symptoms  Re check 3 mo with ferritin  etoh could play a role

## 2016-10-17 NOTE — Assessment & Plan Note (Signed)
Diagnosed by derm with Grover's dz Improving with topical steroid cream

## 2016-10-17 NOTE — Assessment & Plan Note (Signed)
5-8 drinks per day  Disc imp of dec intake or cessation for better health He is not ready/interested yet  Also disc glycemic index of etoh in setting of elevated blood glucose

## 2016-10-17 NOTE — Assessment & Plan Note (Signed)
Reviewed health habits including diet and exercise and skin cancer prevention Reviewed appropriate screening tests for age  Also reviewed health mt list, fam hx and immunization status , as well as social and family history   See HPI Given info on low glycemic diet for prediabetes Labs rev  Declines w/u of anemia Declines colon cancer screen and dexa as well  Enc flu shot each fall Enc alcohol cessation or decrease  Prostate health discussed Made plan to try water exercise or recumbent bike for exercise (due to spinal stenosis) Lab appt 3 mo for a1c and anemia

## 2016-10-17 NOTE — Assessment & Plan Note (Signed)
Seeing derm Rash is improving with topical steroid cream

## 2016-10-17 NOTE — Assessment & Plan Note (Signed)
Pt declines dexa  Comp fx in the past No recent falls or fx Drinks etoh-disc fall prev in detail Counseled on ca and D and exercise as tolerated

## 2016-10-17 NOTE — Assessment & Plan Note (Signed)
Fasting glucose of 110  disc imp of low glycemic diet and wt loss to prevent DM2  Handouts given re: glycemic index  Check A1C in 3 mo after working on diet  Enc etoh cessation

## 2016-10-17 NOTE — Assessment & Plan Note (Addendum)
bp in fair control at this time  BP Readings from Last 1 Encounters:  10/17/16 122/66   No changes needed Disc lifstyle change with low sodium diet and exercise   Amlodipine and spironolactone and cardura refilled

## 2016-10-17 NOTE — Progress Notes (Signed)
Subjective:    Patient ID: Elijah Nixon, male    DOB: 09/15/1932, 83 y.o.   MRN: [id number]  HPI Here for health maintenance exam and to review chronic medical problems    Feeling fairly well overall Rash is doing better  Not doing a whole lot   Had AMW 6/25 Missed one mini cog point- unable to follow 1 of a 3 step command Failed hearing screen - worse in L ear  Pt states that hearing does not bother him or cause problems at home  Per pt-no concentration or memory problems for the most part - if he forgets something he remembers it later  Does not get confused or lost    Wt Readings from Last 3 Encounters:  10/17/16 150 lb 8 oz (68.3 kg)  10/09/16 146 lb 8 oz (66.5 kg)  08/30/16 154 lb (69.9 kg)  wt is at his baseline  His diet is pretty good- wife is trying to loose weight and he is eating like she is  Not much exercise - he can only walk 50 yards due to spinal stenosis / in fact there is no exercise he can do  bmi 25.0  Colonoscopy 11/09 polyp found/ 5 y recall depending on health status  Declines further colon screening   He stopped driving  Hx of macular degeneration-states it is stable   Prostate health Some urinary frequency after he drinks water  Nocturia 4 times per night  Lab Results  Component Value Date   PSA 2.95 10/09/2016   PSA 2.58 09/30/2015   PSA 2.15 09/19/2010    Tetanus shot 10/17 utd imms incl flu shot zostavax 4/05  Hx of OP -with spinal compression fx in the past  Had 5 years of fosamax in the past  He declines further dexa scans No falls or fractures this year   Hx of alcohol abuse  About 5-8 drinks per day  No plans to quit (he does want to cut down)  Does not feel dependent or have w/d symptoms  Does not think it is a problem and not falling    bp is stable today  No cp or palpitations or headaches or edema  No side effects to medicines  BP Readings from Last 3 Encounters:  10/17/16 122/66  10/09/16 120/66  08/30/16  138/66     Saw dermatology for recent rash  (diag with Grover's disease)  Being treated with steroid cream    Labs: Results for orders placed or performed in visit on 10/09/16  CBC with Differential/Platelet  Result Value Ref Range   WBC 6.3 4.0 - 10.5 K/uL   RBC 3.94 (L) 4.22 - 5.81 Mil/uL   Hemoglobin 12.7 (L) 13.0 - 17.0 g/dL   HCT 40.936.2 (L) 81.139.0 - 91.452.0 %   MCV 91.9 78.0 - 100.0 fl   MCHC 35.1 30.0 - 36.0 g/dL   RDW 78.218.4 (H) 95.611.5 - 21.315.5 %   Platelets 332.0 150.0 - 400.0 K/uL   Neutrophils Relative % 56.2 43.0 - 77.0 %   Lymphocytes Relative 24.0 12.0 - 46.0 %   Monocytes Relative 11.3 3.0 - 12.0 %   Eosinophils Relative 7.3 (H) 0.0 - 5.0 %   Basophils Relative 1.2 0.0 - 3.0 %   Neutro Abs 3.6 1.4 - 7.7 K/uL   Lymphs Abs 1.5 0.7 - 4.0 K/uL   Monocytes Absolute 0.7 0.1 - 1.0 K/uL   Eosinophils Absolute 0.5 0.0 - 0.7 K/uL   Basophils Absolute 0.1 0.0 -  0.1 K/uL  Comprehensive metabolic panel  Result Value Ref Range   Sodium 135 135 - 145 mEq/L   Potassium 4.7 3.5 - 5.1 mEq/L   Chloride 99 96 - 112 mEq/L   CO2 30 19 - 32 mEq/L   Glucose, Bld 110 (H) 70 - 99 mg/dL   BUN 8 6 - 23 mg/dL   Creatinine, Ser 1.61 0.40 - 1.50 mg/dL   Total Bilirubin 0.4 0.2 - 1.2 mg/dL   Alkaline Phosphatase 76 39 - 117 U/L   AST 20 0 - 37 U/L   ALT 18 0 - 53 U/L   Total Protein 6.6 6.0 - 8.3 g/dL   Albumin 4.0 3.5 - 5.2 g/dL   Calcium 9.4 8.4 - 09.6 mg/dL   GFR 04.54 >09.81 mL/min  Lipid panel  Result Value Ref Range   Cholesterol 185 0 - 200 mg/dL   Triglycerides 19.1 0.0 - 149.0 mg/dL   HDL 478.29 >56.21 mg/dL   VLDL 6.6 0.0 - 30.8 mg/dL   LDL Cholesterol 68 0 - 99 mg/dL   Total CHOL/HDL Ratio 2    NonHDL 74.34   TSH  Result Value Ref Range   TSH 2.30 0.35 - 4.50 uIU/mL  PSA, Medicare  Result Value Ref Range   PSA 2.95 0.10 - 4.00 ng/ml     Glucose 110- per pt his son has diabetes (late life but has a pump)  Drinks etoh No sweets  Avoid added sugar most of the time  carbs -  he eats bread with a sandwich  Some cereal     Mild anemia- HB is down from 14.0 to 12.7 this check  No black stools No blood in stool  No GI symptoms  Declines work up for this    Patient Active Problem List   Diagnosis Date Noted  . Prediabetes 10/17/2016  . Anemia 10/17/2016  . 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 BPH (benign prostatic hyperplasia) 10/07/2016  . Rash and nonspecific skin eruption 08/30/2016  . Lumbar disc disease 05/06/2015  . Leg pain, bilateral 05/05/2015  . Buttock pain 05/05/2015  . Routine general medical examination at a health care facility 10/02/2014  . Colon cancer screening 09/29/2013  . Encounter for Medicare annual wellness exam 09/27/2012  . Seborrheic keratosis 03/29/2012  . Stress reaction 09/21/2010  . SWELLING, MASS, OR LUMP IN CHEST 06/24/2009  . ERECTILE DYSFUNCTION 02/16/2009  . ACTINIC KERATOSIS 02/16/2009  . ADVERSE DRUG REACTION 11/13/2008  . Alcohol abuse 10/27/2008  . Essential hypertension 10/27/2008  . GEN OSTEOARTHROSIS INVOLVING MULTIPLE SITES 10/27/2008  . Osteoporosis 10/27/2008  . COLONIC POLYPS, HX OF 10/27/2008  . DIVERTICULITIS, HX OF 10/27/2008   Past Medical History:  Diagnosis Date  . AA (alcohol abuse)    at least 8-10 drinks/day  . Arthritis    OA  . DDD (degenerative disc disease)   . ED (erectile dysfunction)   . History of colon polyps   . History of diverticulitis of colon   . Hypertension   . Macular degeneration    dry  . Osteoporosis    spinal compression fx   Past Surgical History:  Procedure Laterality Date  . SPINE SURGERY  1971   deg disc disease x 2   Social History  Substance Use Topics  . Smoking status: Never Smoker  . Smokeless tobacco: Never Used  . Alcohol use 0.0 oz/week     Comment: 5 beers per week   Family History  Problem Relation Age of Onset  .  Diabetes Son    Allergies  Allergen Reactions  . Lisinopril     REACTION: increased potassium leveles   Current Outpatient  Prescriptions on File Prior to Visit  Medication Sig Dispense Refill  . aspirin 81 MG tablet Take 81 mg by mouth daily.      Marland Kitchen b complex vitamins tablet Take 1 tablet by mouth daily.    . Calcium Carbonate-Vit D-Min 600-400 MG-UNIT TABS Take 1 tablet by mouth daily.     . Cyanocobalamin (VITAMIN B-12 PO) Take 1 tablet by mouth daily.    . famotidine (PEPCID) 20 MG tablet Take 20 mg by mouth as needed.     . hydrOXYzine (ATARAX/VISTARIL) 25 MG tablet Take 0.5-1 tablets (12.5-25 mg total) by mouth at bedtime as needed for itching. 30 tablet 1  . Multiple Vitamins-Minerals (PRESERVISION/LUTEIN) CAPS Take 1 capsule by mouth daily.      . naproxen sodium (ANAPROX) 220 MG tablet Take 220 mg by mouth as needed.      No current facility-administered medications on file prior to visit.     Review of Systems Review of Systems  Constitutional: Negative for fever, appetite change, fatigue and unexpected weight change.  Eyes: Negative for pain and visual disturbance.  Respiratory: Negative for cough and shortness of breath.   Cardiovascular: Negative for cp or palpitations    Gastrointestinal: Negative for nausea, diarrhea and constipation.  Genitourinary: Negative for urgency and pos for occ frequency.  MSK pos for chronic back pain  Skin: Negative for pallor or rash   Neurological: Negative for weakness, light-headedness, numbness and headaches.  Hematological: Negative for adenopathy. Does not bruise/bleed easily.  Psychiatric/Behavioral: Negative for dysphoric mood. The patient is not nervous/anxious.         Objective:   Physical Exam  Constitutional: He appears well-developed and well-nourished. No distress.  Frail appearing elderly male  HENT:  Head: Normocephalic and atraumatic.  Right Ear: External ear normal.  Left Ear: External ear normal.  Nose: Nose normal.  Mouth/Throat: Oropharynx is clear and moist.  Eyes: Conjunctivae and EOM are normal. Pupils are equal, round, and  reactive to light. Right eye exhibits no discharge. Left eye exhibits no discharge. No scleral icterus.  Neck: Normal range of motion. Neck supple. No JVD present. Carotid bruit is not present. No thyromegaly present.  Cardiovascular: Normal rate, regular rhythm, normal heart sounds and intact distal pulses.  Exam reveals no gallop.   Pulmonary/Chest: Effort normal and breath sounds normal. No respiratory distress. He has no wheezes. He exhibits no tenderness.  Abdominal: Soft. Bowel sounds are normal. He exhibits no distension, no abdominal bruit and no mass. There is no tenderness.  Musculoskeletal: He exhibits no edema or tenderness.  Limited rom of LS  Slow gait   Lymphadenopathy:    He has no cervical adenopathy.  Neurological: He is alert. He has normal reflexes. He displays tremor. No cranial nerve deficit. He exhibits normal muscle tone. Coordination normal.  Mild hand tremor on intention   Skin: Skin is warm and dry. No rash noted. No erythema. No pallor.  Tanned Solar lentigines diffusely sks and AKs diffusely  Rash is much improved   Psychiatric: His speech is normal and behavior is normal. Thought content normal. His mood appears anxious.  Mildly anxious           Assessment & Plan:   Problem List Items Addressed This Visit      Cardiovascular and Mediastinum   Essential hypertension  bp in fair control at this time  BP Readings from Last 1 Encounters:  10/17/16 122/66   No changes needed Disc lifstyle change with low sodium diet and exercise   Amlodipine and spironolactone and cardura refilled      Relevant Medications   amLODipine (NORVASC) 10 MG tablet   doxazosin (CARDURA) 8 MG tablet   spironolactone (ALDACTONE) 25 MG tablet     Musculoskeletal and Integument   Grover's disease    Seeing derm Rash is improving with topical steroid cream      Osteoporosis    Pt declines dexa  Comp fx in the past No recent falls or fx Drinks etoh-disc fall prev  in detail Counseled on ca and D and exercise as tolerated       Rash and nonspecific skin eruption    Diagnosed by derm with Grover's dz Improving with topical steroid cream        Genitourinary   BPH (benign prostatic hyperplasia)    Pt does not desire tx  Lab Results  Component Value Date   PSA 2.95 10/09/2016   PSA 2.58 09/30/2015   PSA 2.15 09/19/2010    He is on cardura/alpha blocker          Other   Alcohol abuse    5-8 drinks per day  Disc imp of dec intake or cessation for better health He is not ready/interested yet  Also disc glycemic index of etoh in setting of elevated blood glucose       Anemia    New HB down from 14 to 12.7 Pt declines w/u of any kind  Denies symptoms  Re check 3 mo with ferritin  etoh could play a role      Relevant Orders   CBC with Differential/Platelet   Vitamin B12   Ferritin   Prediabetes    Fasting glucose of 110  disc imp of low glycemic diet and wt loss to prevent DM2  Handouts given re: glycemic index  Check A1C in 3 mo after working on diet  Enc etoh cessation       Relevant Orders   Hemoglobin A1c   Routine general medical examination at a health care facility - Primary    Reviewed health habits including diet and exercise and skin cancer prevention Reviewed appropriate screening tests for age  Also reviewed health mt list, fam hx and immunization status , as well as social and family history   See HPI Given info on low glycemic diet for prediabetes Labs rev  Declines w/u of anemia Declines colon cancer screen and dexa as well  Enc flu shot each fall Enc alcohol cessation or decrease  Prostate health discussed Made plan to try water exercise or recumbent bike for exercise (due to spinal stenosis) Lab appt 3 mo for a1c and anemia

## 2016-10-17 NOTE — Assessment & Plan Note (Signed)
Pt does not desire tx  Lab Results  Component Value Date   PSA 2.95 10/09/2016   PSA 2.58 09/30/2015   PSA 2.15 09/19/2010    He is on cardura/alpha blocker

## 2016-10-17 NOTE — Patient Instructions (Addendum)
On the days when your back does not hurt as much- try to get some exercise  Water exercise may be a good option  [facility]   You are mildly anemic-if you change your mind about working this up-please let us know  Especially if any GI symptoms or blood in stool   Your blood glucose is mildly elevated- we need to watch you for diabetes- see the handouts  Cutting back on alcohol may help that   We will check lab work in 3 months for the anemia and also a diabetes test

## 2017-01-09 ENCOUNTER — Other Ambulatory Visit: Payer: Self-pay | Admitting: Family Medicine

## 2017-01-16 ENCOUNTER — Other Ambulatory Visit (INDEPENDENT_AMBULATORY_CARE_PROVIDER_SITE_OTHER): Payer: Medicare Other

## 2017-01-16 DIAGNOSIS — D649 Anemia, unspecified: Secondary | ICD-10-CM | POA: Diagnosis not present

## 2017-01-16 DIAGNOSIS — R7303 Prediabetes: Secondary | ICD-10-CM

## 2017-01-16 LAB — CBC WITH DIFFERENTIAL/PLATELET
Basophils Absolute: 0.1 10*3/uL (ref 0.0–0.1)
Basophils Relative: 1.3 % (ref 0.0–3.0)
EOS PCT: 8.9 % — AB (ref 0.0–5.0)
Eosinophils Absolute: 0.5 10*3/uL (ref 0.0–0.7)
HCT: 37.5 % — ABNORMAL LOW (ref 39.0–52.0)
HEMOGLOBIN: 12.9 g/dL — AB (ref 13.0–17.0)
LYMPHS ABS: 1.8 10*3/uL (ref 0.7–4.0)
Lymphocytes Relative: 29.9 % (ref 12.0–46.0)
MCHC: 34.5 g/dL (ref 30.0–36.0)
MCV: 97.6 fl (ref 78.0–100.0)
MONOS PCT: 8.1 % (ref 3.0–12.0)
Monocytes Absolute: 0.5 10*3/uL (ref 0.1–1.0)
Neutro Abs: 3.1 10*3/uL (ref 1.4–7.7)
Neutrophils Relative %: 51.8 % (ref 43.0–77.0)
Platelets: 320 10*3/uL (ref 150.0–400.0)
RBC: 3.84 Mil/uL — AB (ref 4.22–5.81)
RDW: 14.2 % (ref 11.5–15.5)
WBC: 6 10*3/uL (ref 4.0–10.5)

## 2017-01-16 LAB — FERRITIN: Ferritin: 17.9 ng/mL — ABNORMAL LOW (ref 22.0–322.0)

## 2017-01-16 LAB — HEMOGLOBIN A1C: HEMOGLOBIN A1C: 5.8 % (ref 4.6–6.5)

## 2017-01-16 LAB — VITAMIN B12: Vitamin B-12: 561 pg/mL (ref 211–911)

## 2017-01-21 ENCOUNTER — Emergency Department (HOSPITAL_COMMUNITY): Payer: Medicare Other

## 2017-01-21 ENCOUNTER — Emergency Department (HOSPITAL_COMMUNITY)
Admission: EM | Admit: 2017-01-21 | Discharge: 2017-01-21 | Disposition: A | Discharge status: Home / Self Care | Payer: Medicare Other | Attending: Emergency Medicine | Admitting: Emergency Medicine

## 2017-01-21 ENCOUNTER — Encounter (HOSPITAL_COMMUNITY): Payer: Self-pay | Admitting: *Deleted

## 2017-01-21 DIAGNOSIS — R1032 Left lower quadrant pain: Secondary | ICD-10-CM | POA: Diagnosis not present

## 2017-01-21 DIAGNOSIS — F1010 Alcohol abuse, uncomplicated: Secondary | ICD-10-CM | POA: Insufficient documentation

## 2017-01-21 DIAGNOSIS — I10 Essential (primary) hypertension: Secondary | ICD-10-CM | POA: Insufficient documentation

## 2017-01-21 DIAGNOSIS — Z7982 Long term (current) use of aspirin: Secondary | ICD-10-CM | POA: Insufficient documentation

## 2017-01-21 DIAGNOSIS — K5900 Constipation, unspecified: Secondary | ICD-10-CM | POA: Diagnosis not present

## 2017-01-21 DIAGNOSIS — Z79899 Other long term (current) drug therapy: Secondary | ICD-10-CM | POA: Insufficient documentation

## 2017-01-21 HISTORY — DX: Transient acantholytic dermatosis (grover): L11.1

## 2017-01-21 LAB — COMPREHENSIVE METABOLIC PANEL
ALT: 20 U/L (ref 17–63)
ANION GAP: 10 (ref 5–15)
AST: 26 U/L (ref 15–41)
Albumin: 3.8 g/dL (ref 3.5–5.0)
Alkaline Phosphatase: 67 U/L (ref 38–126)
BUN: 10 mg/dL (ref 6–20)
CALCIUM: 9.2 mg/dL (ref 8.9–10.3)
CO2: 25 mmol/L (ref 22–32)
Chloride: 97 mmol/L — ABNORMAL LOW (ref 101–111)
Creatinine, Ser: 0.86 mg/dL (ref 0.61–1.24)
Glucose, Bld: 135 mg/dL — ABNORMAL HIGH (ref 65–99)
Potassium: 3.9 mmol/L (ref 3.5–5.1)
Sodium: 132 mmol/L — ABNORMAL LOW (ref 135–145)
Total Bilirubin: 0.5 mg/dL (ref 0.3–1.2)
Total Protein: 6.9 g/dL (ref 6.5–8.1)

## 2017-01-21 LAB — URINALYSIS, ROUTINE W REFLEX MICROSCOPIC
Bilirubin Urine: NEGATIVE
GLUCOSE, UA: NEGATIVE mg/dL
HGB URINE DIPSTICK: NEGATIVE
Ketones, ur: NEGATIVE mg/dL
Leukocytes, UA: NEGATIVE
Nitrite: NEGATIVE
PROTEIN: NEGATIVE mg/dL
Specific Gravity, Urine: 1.011 (ref 1.005–1.030)
pH: 8 (ref 5.0–8.0)

## 2017-01-21 LAB — CBC
HCT: 35.4 % — ABNORMAL LOW (ref 39.0–52.0)
HEMOGLOBIN: 12.6 g/dL — AB (ref 13.0–17.0)
MCH: 32.8 pg (ref 26.0–34.0)
MCHC: 35.6 g/dL (ref 30.0–36.0)
MCV: 92.2 fL (ref 78.0–100.0)
PLATELETS: 286 10*3/uL (ref 150–400)
RBC: 3.84 MIL/uL — AB (ref 4.22–5.81)
RDW: 14 % (ref 11.5–15.5)
WBC: 6.4 10*3/uL (ref 4.0–10.5)

## 2017-01-21 LAB — LIPASE, BLOOD: LIPASE: 33 U/L (ref 11–51)

## 2017-01-21 MED ORDER — IOPAMIDOL (ISOVUE-300) INJECTION 61%
INTRAVENOUS | Status: AC
  Administered 2017-01-21: 100 mL
  Filled 2017-01-21: qty 100

## 2017-01-21 MED ORDER — SODIUM CHLORIDE 0.9 % IV BOLUS (SEPSIS)
1000.0000 mL | Freq: Once | INTRAVENOUS | Status: AC
  Administered 2017-01-21: 1000 mL via INTRAVENOUS

## 2017-01-21 NOTE — Discharge Instructions (Signed)
Please read instructions below. You can take stool softener as needed for constipation. Schedule an appointment to follow-up with your primary care provider about your visit today. Return to the ER if your abdominal pain significantly worsens, if you begin vomiting, developed fever, have blood in your stool, or for other new or concerning symptoms.

## 2017-01-21 NOTE — ED Provider Notes (Signed)
[facility]-EMERGENCY DEPT Provider Note   CSN: [id number] Arrival date & time: 01/21/17  4098     History   Chief Complaint Chief Complaint  Patient presents with  . Abdominal Pain    HPI Elijah Nixon is a 83 y.o. male with past medical history of alcohol abuse, diverticulitis, hypertension, presenting to the ED for acute onset of intermittent crampy left lower quadrant abdominal pain2-3 days. Patient states pain worsened overnight. No medications tried, states his pain is currently 1/10 severity in the ED. States this doesn't quite feel is similar as history of diverticulitis, however wanted to get this checked out. Reports associated constipation, but is not new. States he currently drinks 6-8 beers per day. Denies fever/chills, nausea/vomiting, diarrhea, blood in stool, melena,  chest pain, shortness of breath, urinary symptoms, or other complaints today. History of abdominal surgeries. The history is provided by the patient.    Past Medical History:  Diagnosis Date  . AA (alcohol abuse)    at least 8-10 drinks/day  . Arthritis    OA  . DDD (degenerative disc disease)   . ED (erectile dysfunction)   . Grover's disease   . History of colon polyps   . History of diverticulitis of colon   . Hypertension   . Macular degeneration    dry  . Osteoporosis    spinal compression fx    Patient Active Problem List   Diagnosis Date Noted  . Prediabetes 10/17/2016  . Anemia 10/17/2016  . 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 BPH (benign prostatic hyperplasia) 10/07/2016  . Rash and nonspecific skin eruption 08/30/2016  . Lumbar disc disease 05/06/2015  . Leg pain, bilateral 05/05/2015  . Routine general medical examination at a health care facility 10/02/2014  . Colon cancer screening 09/29/2013  . Encounter for Medicare annual wellness exam 09/27/2012  . Seborrheic keratosis 03/29/2012  . SWELLING, MASS, OR LUMP IN CHEST 06/24/2009  . ERECTILE DYSFUNCTION 02/16/2009  . ACTINIC  KERATOSIS 02/16/2009  . ADVERSE DRUG REACTION 11/13/2008  . Alcohol abuse 10/27/2008  . Essential hypertension 10/27/2008  . GEN OSTEOARTHROSIS INVOLVING MULTIPLE SITES 10/27/2008  . Osteoporosis 10/27/2008  . COLONIC POLYPS, HX OF 10/27/2008  . DIVERTICULITIS, HX OF 10/27/2008    Past Surgical History:  Procedure Laterality Date  . SPINE SURGERY  1971   deg disc disease x 2       Home Medications    Prior to Admission medications   Medication Sig Start Date End Date Taking? Authorizing Provider  amLODipine (NORVASC) 10 MG tablet TAKE 1 TABLET BY MOUTH DAILY 10/17/16  Yes Tower, Audrie Gallus, MD  aspirin 81 MG tablet Take 81 mg by mouth daily.     Yes [provider]  b complex vitamins tablet Take 1 tablet by mouth daily.   Yes [provider]  Calcium Carbonate-Vit D-Min 600-400 MG-UNIT TABS Take 1 tablet by mouth daily.    Yes [provider]  Cyanocobalamin (VITAMIN B-12 PO) Take 1 tablet by mouth daily.   Yes [provider]  doxazosin (CARDURA) 8 MG tablet TAKE ONE (1) TABLET BY MOUTH EVERY DAY 10/17/16  Yes Tower, Audrie Gallus, MD  hydrocortisone cream 1 % Apply 1 application topically 2 (two) times daily as needed for itching (for Grovers skin disease).   Yes [provider]  ibuprofen (ADVIL,MOTRIN) 200 MG tablet Take 600 mg by mouth 2 (two) times daily.   Yes [provider]  Multiple Vitamins-Minerals (PRESERVISION/LUTEIN) CAPS Take 1 capsule by mouth  daily.     Yes [provider]  spironolactone (ALDACTONE) 25 MG tablet Take 0.5 tablets (12.5 mg total) by mouth daily. 10/17/16  Yes Tower, Audrie Gallus, MD  triamcinolone cream (KENALOG) 0.1 % Apply 1 application topically 2 (two) times daily as needed (for itching for Grovers skin disease).   Yes [provider]  famotidine (PEPCID) 20 MG tablet Take 20 mg by mouth as needed.     [provider]  hydrOXYzine (ATARAX/VISTARIL) 25 MG tablet Take 0.5-1 tablets  (12.5-25 mg total) by mouth at bedtime as needed for itching. 08/30/16   Tower, Audrie Gallus, MD  naproxen sodium (ANAPROX) 220 MG tablet Take 220 mg by mouth as needed.     [provider]    Family History Family History  Problem Relation Age of Onset  . Diabetes Son     Social History Social History  Substance Use Topics  . Smoking status: Never Smoker  . Smokeless tobacco: Never Used  . Alcohol use 0.0 oz/week     Comment: 5 beers per week     Allergies   Lisinopril   Review of Systems Review of Systems  Constitutional: Negative for appetite change, chills and fever.  Gastrointestinal: Positive for abdominal pain and constipation. Negative for blood in stool, diarrhea, nausea and vomiting.  Genitourinary: Negative for dysuria and frequency.  All other systems reviewed and are negative.    Physical Exam Updated Vital Signs BP (!) 148/64 (BP Location: Left Arm)   Pulse 80   Temp 97.7 F (36.5 C) (Oral)   Resp 18   SpO2 96%   Physical Exam  Constitutional: He appears well-developed and well-nourished. No distress.  HENT:  Head: Normocephalic and atraumatic.  Mouth/Throat: Oropharynx is clear and moist.  Eyes: Conjunctivae are normal.  Cardiovascular: Normal rate, regular rhythm, normal heart sounds and intact distal pulses.  Exam reveals no friction rub.   No murmur heard. Pulmonary/Chest: Effort normal and breath sounds normal. No respiratory distress. He has no wheezes. He has no rales.  Abdominal: Soft. Bowel sounds are normal. He exhibits mass (grape-sized mobile subcutaneous mass in midline epigastric region, nontender). He exhibits no distension. There is tenderness in the suprapubic area, left upper quadrant and left lower quadrant. There is no guarding. A hernia (umbilical hernia, reducible and nontender) is present.  Neurological: He is alert.  Skin: Skin is warm.  Psychiatric: He has a normal mood and affect. His behavior is normal.  Nursing note  and vitals reviewed.    ED Treatments / Results  Labs (all labs ordered are listed, but only abnormal results are displayed) Labs Reviewed  COMPREHENSIVE METABOLIC PANEL - Abnormal; Notable for the following:       Result Value   Sodium 132 (*)    Chloride 97 (*)    Glucose, Bld 135 (*)    All other components within normal limits  CBC - Abnormal; Notable for the following:    RBC 3.84 (*)    Hemoglobin 12.6 (*)    HCT 35.4 (*)    All other components within normal limits  LIPASE, BLOOD  URINALYSIS, ROUTINE W REFLEX MICROSCOPIC    EKG  EKG Interpretation None       Radiology Ct Abdomen Pelvis W Contrast  Result Date: 01/21/2017 CLINICAL DATA:  83 year old male with history of left lower quadrant abdominal pain for the past 2-3 days intermittently. No associated fever. EXAM: CT ABDOMEN AND PELVIS WITH CONTRAST TECHNIQUE: Multidetector CT imaging of the  abdomen and pelvis was performed using the standard protocol following bolus administration of intravenous contrast. CONTRAST:  ISOVUE-300 IOPAMIDOL (ISOVUE-300) INJECTION 61% COMPARISON:  None. FINDINGS: Lower chest: Atherosclerotic calcifications in the right coronary artery. Calcifications of the aortic valve. Mild scarring in the visualize lung bases. Small calcified granuloma in the periphery of the left lower lung. Hepatobiliary: No discrete cystic or solid hepatic lesions are identified. New linear area of hypoperfusion in segment 5 of the liver suspicious for an area of intrahepatic biliary ductal dilatation, however, this may alternatively represent sequela of recent trauma. Gallbladder is nearly completely decompressed. Common bile duct is not dilated. Pancreas: No pancreatic mass. No pancreatic ductal dilatation. No pancreatic or peripancreatic fluid or inflammatory changes. Spleen: Unremarkable. Adrenals/Urinary Tract: 1.8 cm exophytic low-attenuation lesion extending from the medial aspect of the interpolar region of  the right kidney is compatible with a simple cyst. Left kidney and bilateral adrenal glands are normal in appearance. No hydroureteronephrosis. Urinary bladder is unremarkable in appearance. Stomach/Bowel: The appearance of the stomach is normal. There is no pathologic dilatation of small bowel or colon. A few scattered colonic diverticulae are noted, without surrounding inflammatory changes to suggest an acute diverticulitis at this time. Normal appendix. Vascular/Lymphatic: Aortic atherosclerosis, without evidence of aneurysm or dissection in the abdominal or pelvic vasculature. No lymphadenopathy noted in the abdomen or pelvis. Reproductive: Prostate gland and seminal vesicles are unremarkable in appearance. Other: No significant volume of ascites.  No pneumoperitoneum. Musculoskeletal: There are no aggressive appearing lytic or blastic lesions noted in the visualized portions of the skeleton. IMPRESSION: 1. No acute findings are noted in the abdomen or pelvis to account for the patient's symptoms. 2. There is an unusual linear area of low attenuation in segment 5 of the liver. May represent a mildly dilated intrahepatic bile duct. Alternatively, if there has been a history of recent trauma, this could represent a posttraumatic laceration (although this is not favored). Correlation with clinical history is recommended. This could be further evaluated with nonemergent MRI of the abdomen with and without IV gadolinium with MRCP if clinically appropriate. 3. Extensive colonic diverticulosis without findings to suggest an acute diverticulitis at this time. 4. Aortic atherosclerosis, in addition to at least right coronary artery disease. 5. There are calcifications of the aortic valve. Echocardiographic correlation for evaluation of potential valvular dysfunction may be warranted if clinically indicated. 6. Additional incidental findings, as above. Aortic Atherosclerosis (ICD10-I70.0). Electronically Signed   By:  Trudie Reed M.D.   On: 01/21/2017 11:18    Procedures Procedures (including critical care time)  Medications Ordered in ED Medications  sodium chloride 0.9 % bolus 1,000 mL (1,000 mLs Intravenous New Bag/Given 01/21/17 1033)  iopamidol (ISOVUE-300) 61 % injection (100 mLs  Contrast Given 01/21/17 1050)     Initial Impression / Assessment and Plan / ED Course  I have reviewed the triage vital signs and the nursing notes.  Pertinent labs & imaging results that were available during my care of the patient were reviewed by me and considered in my medical decision making (see chart for details).     Pt w intermittent LLQ abdominal pain. Patient is nontoxic, nonseptic appearing, in no apparent distress.  Patient's pain and other symptoms adequately managed in emergency department.  Fluid bolus given.  Labs, imaging and vitals reviewed.  Patient does not meet the SIRS or Sepsis criteria.  On repeat exam patient does not have a surgical abdomen and there are no peritoneal signs.  No  indication of appendicitis, bowel obstruction, bowel perforation, cholecystitis, diverticulitis. Patient discharged home with symptomatic treatment and given strict instructions for follow-up with their primary care physician.  Pt safe for discharge.  Patient discussed with and seen by Dr. Deretha Emory.  Discussed results, findings, treatment and follow up. Patient advised of return precautions. Patient verbalized understanding and agreed with plan.  Final Clinical Impressions(s) / ED Diagnoses   Final diagnoses:  LLQ abdominal pain    New Prescriptions New Prescriptions   No medications on file     Russo, [place] N, PA-C 01/21/17 1223    Vanetta Mulders, MD 01/23/17 [phone]

## 2017-01-21 NOTE — ED Notes (Signed)
Patient transported to CT 

## 2017-01-21 NOTE — ED Provider Notes (Signed)
Medical screening examination/treatment/procedure(s) were conducted as a shared visit with non-physician practitioner(s) and myself.  I personally evaluated the patient during the encounter.   EKG Interpretation None      Results for orders placed or performed during the hospital encounter of 01/21/17  Lipase, blood  Result Value Ref Range   Lipase 33 11 - 51 U/L  Comprehensive metabolic panel  Result Value Ref Range   Sodium 132 (L) 135 - 145 mmol/L   Potassium 3.9 3.5 - 5.1 mmol/L   Chloride 97 (L) 101 - 111 mmol/L   CO2 25 22 - 32 mmol/L   Glucose, Bld 135 (H) 65 - 99 mg/dL   BUN 10 6 - 20 mg/dL   Creatinine, Ser 1.32 0.61 - 1.24 mg/dL   Calcium 9.2 8.9 - 44.0 mg/dL   Total Protein 6.9 6.5 - 8.1 g/dL   Albumin 3.8 3.5 - 5.0 g/dL   AST 26 15 - 41 U/L   ALT 20 17 - 63 U/L   Alkaline Phosphatase 67 38 - 126 U/L   Total Bilirubin 0.5 0.3 - 1.2 mg/dL   GFR calc non Af Amer >60 >60 mL/min   GFR calc Af Amer >60 >60 mL/min   Anion gap 10 5 - 15  CBC  Result Value Ref Range   WBC 6.4 4.0 - 10.5 K/uL   RBC 3.84 (L) 4.22 - 5.81 MIL/uL   Hemoglobin 12.6 (L) 13.0 - 17.0 g/dL   HCT 10.2 (L) 72.5 - 36.6 %   MCV 92.2 78.0 - 100.0 fL   MCH 32.8 26.0 - 34.0 pg   MCHC 35.6 30.0 - 36.0 g/dL   RDW 44.0 34.7 - 42.5 %   Platelets 286 150 - 400 K/uL   Ct Abdomen Pelvis W Contrast  Result Date: 01/21/2017 CLINICAL DATA:  83 year old male with history of left lower quadrant abdominal pain for the past 2-3 days intermittently. No associated fever. EXAM: CT ABDOMEN AND PELVIS WITH CONTRAST TECHNIQUE: Multidetector CT imaging of the abdomen and pelvis was performed using the standard protocol following bolus administration of intravenous contrast. CONTRAST:  ISOVUE-300 IOPAMIDOL (ISOVUE-300) INJECTION 61% COMPARISON:  None. FINDINGS: Lower chest: Atherosclerotic calcifications in the right coronary artery. Calcifications of the aortic valve. Mild scarring in the visualize lung bases. Small  calcified granuloma in the periphery of the left lower lung. Hepatobiliary: No discrete cystic or solid hepatic lesions are identified. New linear area of hypoperfusion in segment 5 of the liver suspicious for an area of intrahepatic biliary ductal dilatation, however, this may alternatively represent sequela of recent trauma. Gallbladder is nearly completely decompressed. Common bile duct is not dilated. Pancreas: No pancreatic mass. No pancreatic ductal dilatation. No pancreatic or peripancreatic fluid or inflammatory changes. Spleen: Unremarkable. Adrenals/Urinary Tract: 1.8 cm exophytic low-attenuation lesion extending from the medial aspect of the interpolar region of the right kidney is compatible with a simple cyst. Left kidney and bilateral adrenal glands are normal in appearance. No hydroureteronephrosis. Urinary bladder is unremarkable in appearance. Stomach/Bowel: The appearance of the stomach is normal. There is no pathologic dilatation of small bowel or colon. A few scattered colonic diverticulae are noted, without surrounding inflammatory changes to suggest an acute diverticulitis at this time. Normal appendix. Vascular/Lymphatic: Aortic atherosclerosis, without evidence of aneurysm or dissection in the abdominal or pelvic vasculature. No lymphadenopathy noted in the abdomen or pelvis. Reproductive: Prostate gland and seminal vesicles are unremarkable in appearance. Other: No significant volume of ascites.  No pneumoperitoneum. Musculoskeletal: There  are no aggressive appearing lytic or blastic lesions noted in the visualized portions of the skeleton. IMPRESSION: 1. No acute findings are noted in the abdomen or pelvis to account for the patient's symptoms. 2. There is an unusual linear area of low attenuation in segment 5 of the liver. May represent a mildly dilated intrahepatic bile duct. Alternatively, if there has been a history of recent trauma, this could represent a posttraumatic laceration  (although this is not favored). Correlation with clinical history is recommended. This could be further evaluated with nonemergent MRI of the abdomen with and without IV gadolinium with MRCP if clinically appropriate. 3. Extensive colonic diverticulosis without findings to suggest an acute diverticulitis at this time. 4. Aortic atherosclerosis, in addition to at least right coronary artery disease. 5. There are calcifications of the aortic valve. Echocardiographic correlation for evaluation of potential valvular dysfunction may be warranted if clinically indicated. 6. Additional incidental findings, as above. Aortic Atherosclerosis (ICD10-I70.0). Electronically Signed   By: Trudie Reed M.D.   On: 01/21/2017 11:18    Patient seen by me along with physician assistant. Patient with pain in the left lower quadrant of the abdomen overnight. Now improved. CT scan shows no evidence of diverticulitis or any other acute problem. No leukocytosis. Labs without significant abnormalities. To include liver function tests even though patient drinks 6-8 beers a day.  Patient's urinalysis is still pending.  Physical exam abdomen soft without any significant tenderness at this time.  Results of urinalysis will be needed however patient is probably stable for discharge home.   Vanetta Mulders, MD 01/21/17 1152

## 2017-01-21 NOTE — ED Triage Notes (Signed)
Pt reports having LLQ pain x 2-3 days that is intermittent. Denies fever, n/v/d. No acute distress is noted at triage.

## 2017-02-06 NOTE — Addendum Note (Signed)
Addended by: Alvina ChouWALSH, Durell Lofaso J on: 02/06/2017 11:36 AM   Modules accepted: Orders

## 2017-02-07 ENCOUNTER — Other Ambulatory Visit: Payer: Medicare Other

## 2017-02-07 DIAGNOSIS — D649 Anemia, unspecified: Secondary | ICD-10-CM

## 2017-02-07 LAB — HEMOCCULT SLIDES (X 3 CARDS)
FECAL OCCULT BLD: NEGATIVE
OCCULT 1: NEGATIVE
OCCULT 2: NEGATIVE
OCCULT 3: NEGATIVE
OCCULT 4: NEGATIVE
OCCULT 5: NEGATIVE

## 2017-02-15 ENCOUNTER — Emergency Department (HOSPITAL_COMMUNITY): Payer: Medicare Other

## 2017-02-15 ENCOUNTER — Encounter (HOSPITAL_COMMUNITY): Payer: Self-pay | Admitting: Emergency Medicine

## 2017-02-15 ENCOUNTER — Emergency Department (HOSPITAL_COMMUNITY)
Admission: EM | Admit: 2017-02-15 | Discharge: 2017-02-15 | Disposition: A | Discharge status: Home / Self Care | Payer: Medicare Other | Attending: Emergency Medicine | Admitting: Emergency Medicine

## 2017-02-15 DIAGNOSIS — Y929 Unspecified place or not applicable: Secondary | ICD-10-CM | POA: Insufficient documentation

## 2017-02-15 DIAGNOSIS — W01198A Fall on same level from slipping, tripping and stumbling with subsequent striking against other object, initial encounter: Secondary | ICD-10-CM | POA: Insufficient documentation

## 2017-02-15 DIAGNOSIS — Y92009 Unspecified place in unspecified non-institutional (private) residence as the place of occurrence of the external cause: Secondary | ICD-10-CM

## 2017-02-15 DIAGNOSIS — I10 Essential (primary) hypertension: Secondary | ICD-10-CM | POA: Insufficient documentation

## 2017-02-15 DIAGNOSIS — Z79899 Other long term (current) drug therapy: Secondary | ICD-10-CM | POA: Diagnosis not present

## 2017-02-15 DIAGNOSIS — Y9389 Activity, other specified: Secondary | ICD-10-CM | POA: Insufficient documentation

## 2017-02-15 DIAGNOSIS — D649 Anemia, unspecified: Secondary | ICD-10-CM | POA: Diagnosis not present

## 2017-02-15 DIAGNOSIS — Y999 Unspecified external cause status: Secondary | ICD-10-CM | POA: Insufficient documentation

## 2017-02-15 DIAGNOSIS — S52502A Unspecified fracture of the lower end of left radius, initial encounter for closed fracture: Secondary | ICD-10-CM

## 2017-02-15 DIAGNOSIS — W19XXXA Unspecified fall, initial encounter: Secondary | ICD-10-CM

## 2017-02-15 DIAGNOSIS — Z7982 Long term (current) use of aspirin: Secondary | ICD-10-CM | POA: Diagnosis not present

## 2017-02-15 DIAGNOSIS — S2241XA Multiple fractures of ribs, right side, initial encounter for closed fracture: Secondary | ICD-10-CM | POA: Insufficient documentation

## 2017-02-15 DIAGNOSIS — Z791 Long term (current) use of non-steroidal anti-inflammatories (NSAID): Secondary | ICD-10-CM | POA: Insufficient documentation

## 2017-02-15 DIAGNOSIS — S59912A Unspecified injury of left forearm, initial encounter: Secondary | ICD-10-CM | POA: Diagnosis present

## 2017-02-15 MED ORDER — METHOCARBAMOL 500 MG PO TABS: ORAL_TABLET | ORAL | 0 refills | Status: DC

## 2017-02-15 MED ORDER — OXYCODONE-ACETAMINOPHEN 5-325 MG PO TABS
1.0000 | ORAL_TABLET | Freq: Once | ORAL | Status: AC
Start: 2017-02-15 — End: 2017-02-15
  Administered 2017-02-15: 1 via ORAL
  Filled 2017-02-15: qty 1

## 2017-02-15 MED ORDER — FENTANYL CITRATE (PF) 100 MCG/2ML IJ SOLN
50.0000 ug | Freq: Once | INTRAMUSCULAR | Status: AC
  Administered 2017-02-15: 50 ug via INTRAMUSCULAR
  Filled 2017-02-15: qty 2

## 2017-02-15 MED ORDER — PERCOCET 5-325 MG PO TABS: 1.0000 | ORAL_TABLET | Freq: Four times a day (QID) | ORAL | 0 refills | Status: DC | PRN

## 2017-02-15 NOTE — ED Notes (Signed)
Pt is xray 

## 2017-02-15 NOTE — Progress Notes (Signed)
Orthopedic Tech Progress Note Patient Details:  Elijah Nixon January 11, 1933 [id number]  Ortho Devices Type of Ortho Device: Arm sling, Sugartong splint Ortho Device/Splint Location: lue Ortho Device/Splint Interventions: Ordered, Application, Adjustment   [name], [name] 02/15/2017, 5:21 AM

## 2017-02-15 NOTE — ED Provider Notes (Signed)
[facility]-EMERGENCY DEPT Provider Note   CSN: [id number] Arrival date & time: 02/15/17  0057  Time seen 01:50 AM   History   Chief Complaint Chief Complaint  Patient presents with  . Fall  . Wrist Injury  . Flank Pain    HPI Elijah Nixon is a 83 y.o. male.  HPI patient reports he was getting ready to go to bed tonight and he had closed the door and he rapidly turned around to go to the bed and his feet got tangled up in the rug and he fell.  He denies hitting his head or having loss of consciousness.  He states he thinks his left wrist hit the TV table and he also has pain in his right lateral chest.  He states it hurts with big deep breaths but he does not feel short of breath.  He denies hitting his head or having loss of consciousness.  He denies nausea, vomiting, blurred vision or numbness or tingling of his extremities.  PCP Tower, Audrie Gallus, MD Ar[facility] and [place], last seen 6 months ago for his LBP  Past Medical History:  Diagnosis Date  . AA (alcohol abuse)    at least 8-10 drinks/day  . Arthritis    OA  . DDD (degenerative disc disease)   . ED (erectile dysfunction)   . Grover's disease   . History of colon polyps   . History of diverticulitis of colon   . Hypertension   . Macular degeneration    dry  . Osteoporosis    spinal compression fx    Patient Active Problem List   Diagnosis Date Noted  . Prediabetes 10/17/2016  . Anemia 10/17/2016  . 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 BPH (benign prostatic hyperplasia) 10/07/2016  . Rash and nonspecific skin eruption 08/30/2016  . Lumbar disc disease 05/06/2015  . Leg pain, bilateral 05/05/2015  . Routine general medical examination at a health care facility 10/02/2014  . Colon cancer screening 09/29/2013  . Encounter for Medicare annual wellness exam 09/27/2012  . Seborrheic keratosis 03/29/2012  . SWELLING, MASS, OR LUMP IN CHEST 06/24/2009  . ERECTILE DYSFUNCTION 02/16/2009  .  ACTINIC KERATOSIS 02/16/2009  . ADVERSE DRUG REACTION 11/13/2008  . Alcohol abuse 10/27/2008  . Essential hypertension 10/27/2008  . GEN OSTEOARTHROSIS INVOLVING MULTIPLE SITES 10/27/2008  . Osteoporosis 10/27/2008  . COLONIC POLYPS, HX OF 10/27/2008  . DIVERTICULITIS, HX OF 10/27/2008    Past Surgical History:  Procedure Laterality Date  . SPINE SURGERY  1971   deg disc disease x 2       Home Medications    Prior to Admission medications   Medication Sig Start Date End Date Taking? Authorizing Provider  amLODipine (NORVASC) 10 MG tablet TAKE 1 TABLET BY MOUTH DAILY 10/17/16  Yes Tower, Audrie Gallus, MD  aspirin 81 MG tablet Take 81 mg by mouth daily.     Yes [provider]  b complex vitamins tablet Take 1 tablet by mouth daily.   Yes [provider]  Calcium Carbonate-Vit D-Min 600-400 MG-UNIT TABS Take 1 tablet by mouth daily.    Yes [provider]  Cyanocobalamin (VITAMIN B-12 PO) Take 1 tablet by mouth daily.   Yes [provider]  doxazosin (CARDURA) 8 MG tablet TAKE ONE (1) TABLET BY MOUTH EVERY DAY 10/17/16  Yes Tower, Audrie Gallus, MD  hydrocortisone cream 1 % Apply 1 application topically 2 (two) times daily as needed for itching (for Grovers skin  disease).   Yes [provider]  hydrOXYzine (ATARAX/VISTARIL) 25 MG tablet Take 0.5-1 tablets (12.5-25 mg total) by mouth at bedtime as needed for itching. 08/30/16  Yes Tower, Audrie Gallus, MD  ibuprofen (ADVIL,MOTRIN) 200 MG tablet Take 600 mg by mouth 2 (two) times daily.   Yes [provider]  Multiple Vitamins-Minerals (PRESERVISION/LUTEIN) CAPS Take 1 capsule by mouth daily.     Yes [provider]  spironolactone (ALDACTONE) 25 MG tablet Take 0.5 tablets (12.5 mg total) by mouth daily. 10/17/16  Yes Tower, Audrie Gallus, MD  triamcinolone cream (KENALOG) 0.1 % Apply 1 application topically 2 (two) times daily as needed (for itching for Grovers skin disease).   Yes [provider]  methocarbamol (ROBAXIN) 500 MG tablet Take 1 or 2 po Q 6hrs for muscle spasms or pain 02/15/17   Devoria Albe, MD  PERCOCET 5-325 MG tablet Take 1 tablet by mouth every 6 (six) hours as needed for severe pain. 02/15/17   Devoria Albe, MD    Family History Family History  Problem Relation Age of Onset  . Diabetes Son     Social History Social History  Substance Use Topics  . Smoking status: Never Smoker  . Smokeless tobacco: Never Used  . Alcohol use 0.0 oz/week  lives at home Lives with spouse   Allergies   Lisinopril   Review of Systems Review of Systems  All other systems reviewed and are negative.    Physical Exam Updated Vital Signs BP (!) 151/72 (BP Location: Right Arm)   Pulse 84   Temp 97.9 F (36.6 C) (Oral)   Resp 14   Ht 5' 7.5" (1.715 m)   Wt 68 kg (150 lb)   SpO2 97%   BMI 23.15 kg/m   Vital signs normal except hypertension  Physical Exam  Constitutional: He is oriented to person, place, and time. He appears well-developed and well-nourished.  Non-toxic appearance. He does not appear ill. No distress.  HENT:  Head: Normocephalic and atraumatic.  Right Ear: External ear normal.  Left Ear: External ear normal.  Nose: Nose normal. No mucosal edema or rhinorrhea.  Mouth/Throat: Oropharynx is clear and moist and mucous membranes are normal. No dental abscesses or uvula swelling.  Eyes: Pupils are equal, round, and reactive to light. Conjunctivae and EOM are normal.  Neck: Normal range of motion and full passive range of motion without pain. Neck supple.  Cardiovascular: Normal rate.   Pulmonary/Chest: Effort normal. No respiratory distress. He has no rhonchi. He exhibits tenderness. He exhibits no crepitus.  Abdominal: Soft. Normal appearance and bowel sounds are normal. He exhibits no distension. There is no tenderness. There is no rebound and no guarding.  Musculoskeletal: Normal range of motion. He exhibits edema, tenderness and deformity.  Moves  all extremities well except his left upper extremity.  Patient is noted to have diffuse swelling of his left wrist with discomfort to palpation.  He has limited range of motion due to pain.  He has good distal pulses and capillary refill.  Neurological: He is alert and oriented to person, place, and time. He has normal strength. No cranial nerve deficit.  Skin: Skin is warm, dry and intact. No rash noted. No erythema. No pallor.  Psychiatric: He has a normal mood and affect. His speech is normal and behavior is normal. His mood appears not anxious.  Nursing note and vitals reviewed.    ED Treatments / Results  Labs (all labs ordered are listed, but  only abnormal results are displayed) Labs Reviewed - No data to display  EKG  EKG Interpretation None       Radiology Dg Ribs Unilateral W/chest Right  Result Date: 02/15/2017 CLINICAL DATA:  83 year old male with fall and right lower rib pain. EXAM: RIGHT RIBS AND CHEST - 3+ VIEW COMPARISON:  Chest radiograph dated 03/30/2015 FINDINGS: There are bibasilar linear atelectasis/ scarring. There is no focal consolidation, pleural effusion, or pneumothorax. The cardiac silhouette is within normal limits. The bones are osteopenic. There are degenerative changes of the spine with osteophyte. Displaced fractures of the right lateral ninth and tenth ribs. Mild haziness of the right lung base likely atelectatic changes or possible mild pulmonary contusion. Air in the right upper abdomen under the right hemidiaphragm most likely within the colon. Pneumoperitoneum is less likely. IMPRESSION: Displaced fractures of the right lateral ninth and tenth ribs. Air in the right upper abdomen, likely within the colon. Pneumoperitoneum is much less likely. Electronically Signed   By: Elgie CollardArash  Radparvar M.D.   On: 02/15/2017 03:20   Dg Wrist Complete Left  Result Date: 02/15/2017 CLINICAL DATA:  Fall with left wrist pain. EXAM: LEFT WRIST - COMPLETE 3+ VIEW COMPARISON:   None. FINDINGS: Comminuted distal radius fracture with apex volar angulation and mild displacement. Fracture extends into the radiocarpal and distal radioulnar joints. There is an associated mildly displaced ulna styloid fracture. Well corticated osseous density about the dorsal carpal row consistent with remote triquetrum injury. Advanced osteoarthritis at the base of the thumb. Chondrocalcinosis noted of the triangular fibrocartilage, incidental. Mild widening of the scapholunate interval. Soft tissue edema about the wrist. IMPRESSION: Comminuted displaced angulated intra-articular distal radius fracture. Minimally displaced ulna styloid fracture. Electronically Signed   By: Rubye OaksMelanie  Ehinger M.D.   On: 02/15/2017 02:08    Procedures Procedures (including critical care time)  Medications Ordered in ED Medications  fentaNYL (SUBLIMAZE) injection 50 mcg (50 mcg Intramuscular Given 02/15/17 0312)  oxyCODONE-acetaminophen (PERCOCET/ROXICET) 5-325 MG per tablet 1 tablet (1 tablet Oral Given 02/15/17 0524)     Initial Impression / Assessment and Plan / ED Course  I have reviewed the triage vital signs and the nursing notes.  Pertinent labs & imaging results that were available during my care of the patient were reviewed by me and considered in my medical decision making (see chart for details).    I reviewed patient's x-ray and ordered a sugar tong splint.  Patient was given pain medication IM.  Prior to going back to x-ray and getting x-rays of his right ribs.  They state they see August [facility] and [facility] and would like them to take care of his fracture.  I discussed patient's x-ray results with patient and his wife.  We are still waiting for the [facility] to come do his sugar tong splint.   4:03 AM Dr. Jena GaussHaddix, orthopedist on call for [facility] states patient can follow-up in the office.  5 AM still waiting for the orthopedist to come to his sugar tong splint.  Final Clinical  Impressions(s) / ED Diagnoses   Final diagnoses:  Fall in home, initial encounter  Closed fracture of distal end of left radius, unspecified fracture morphology, initial encounter  Closed fracture of multiple ribs of right side, initial encounter    New Prescriptions Discharge Medication List as of 02/15/2017  5:11 AM    START taking these medications   Details  methocarbamol (ROBAXIN) 500 MG tablet Take 1 or 2 po Q 6hrs for muscle spasms  or pain, Print    PERCOCET 5-325 MG tablet Take 1 tablet by mouth every 6 (six) hours as needed for severe pain., Starting Thu 02/15/2017, Print        Plan discharge  Devoria Albe, MD, Concha Pyo, MD 02/15/17 [phone]

## 2017-02-15 NOTE — Discharge Instructions (Signed)
Elevate your arm. Use ice packs for comfort and to help with the swelling. Take the medications as needed for pain. [facility] to get an appointment to discuss surgery. You have a "comminuted displaced angulated intra-articular distal radius fracture, minimally displaced ulna styloid fracture".  Return to the emergency department if you are struggling to breathe, you get numbness or pain in your fingers.

## 2017-02-15 NOTE — ED Triage Notes (Signed)
Pt brought in from home by EMS after he fell tonight  Pt states he was going to bed and tripped over his own feet and fell  Pt is c/o right wrist pain and left flank pain  Pt has swelling noted to the left wrist  Pt states his left side area hurts when he takes a deep breath  Denies LOC

## 2017-04-28 ENCOUNTER — Inpatient Hospital Stay (HOSPITAL_COMMUNITY): Payer: Medicare Other

## 2017-04-28 ENCOUNTER — Encounter (HOSPITAL_COMMUNITY): Payer: Self-pay

## 2017-04-28 ENCOUNTER — Encounter (HOSPITAL_COMMUNITY)
Admission: EM | Disposition: A | Discharge status: Skilled Nursing Facility | Payer: Self-pay | Source: Home / Self Care | Attending: Internal Medicine

## 2017-04-28 ENCOUNTER — Emergency Department (HOSPITAL_COMMUNITY): Payer: Medicare Other

## 2017-04-28 ENCOUNTER — Inpatient Hospital Stay (HOSPITAL_COMMUNITY)
Admission: EM | Admit: 2017-04-28 | Discharge: 2017-05-03 | DRG: 481 | Disposition: A | Discharge status: Skilled Nursing Facility | Payer: Medicare Other | Attending: Internal Medicine | Admitting: Internal Medicine

## 2017-04-28 ENCOUNTER — Emergency Department (HOSPITAL_COMMUNITY): Payer: Medicare Other | Admitting: Anesthesiology

## 2017-04-28 DIAGNOSIS — E876 Hypokalemia: Secondary | ICD-10-CM | POA: Diagnosis present

## 2017-04-28 DIAGNOSIS — M158 Other polyosteoarthritis: Secondary | ICD-10-CM | POA: Diagnosis present

## 2017-04-28 DIAGNOSIS — D638 Anemia in other chronic diseases classified elsewhere: Secondary | ICD-10-CM | POA: Diagnosis present

## 2017-04-28 DIAGNOSIS — S72001A Fracture of unspecified part of neck of right femur, initial encounter for closed fracture: Secondary | ICD-10-CM

## 2017-04-28 DIAGNOSIS — I10 Essential (primary) hypertension: Secondary | ICD-10-CM | POA: Diagnosis present

## 2017-04-28 DIAGNOSIS — W19XXXA Unspecified fall, initial encounter: Secondary | ICD-10-CM

## 2017-04-28 DIAGNOSIS — Z888 Allergy status to other drugs, medicaments and biological substances status: Secondary | ICD-10-CM | POA: Diagnosis not present

## 2017-04-28 DIAGNOSIS — M810 Age-related osteoporosis without current pathological fracture: Secondary | ICD-10-CM | POA: Diagnosis present

## 2017-04-28 DIAGNOSIS — Z833 Family history of diabetes mellitus: Secondary | ICD-10-CM

## 2017-04-28 DIAGNOSIS — D62 Acute posthemorrhagic anemia: Secondary | ICD-10-CM | POA: Diagnosis not present

## 2017-04-28 DIAGNOSIS — Z8781 Personal history of (healed) traumatic fracture: Secondary | ICD-10-CM

## 2017-04-28 DIAGNOSIS — M159 Polyosteoarthritis, unspecified: Secondary | ICD-10-CM | POA: Diagnosis not present

## 2017-04-28 DIAGNOSIS — N401 Enlarged prostate with lower urinary tract symptoms: Secondary | ICD-10-CM | POA: Diagnosis not present

## 2017-04-28 DIAGNOSIS — L821 Other seborrheic keratosis: Secondary | ICD-10-CM | POA: Diagnosis present

## 2017-04-28 DIAGNOSIS — F1010 Alcohol abuse, uncomplicated: Secondary | ICD-10-CM

## 2017-04-28 DIAGNOSIS — Z09 Encounter for follow-up examination after completed treatment for conditions other than malignant neoplasm: Secondary | ICD-10-CM

## 2017-04-28 DIAGNOSIS — F10239 Alcohol dependence with withdrawal, unspecified: Secondary | ICD-10-CM | POA: Diagnosis not present

## 2017-04-28 DIAGNOSIS — S72141A Displaced intertrochanteric fracture of right femur, initial encounter for closed fracture: Secondary | ICD-10-CM

## 2017-04-28 DIAGNOSIS — W1830XA Fall on same level, unspecified, initial encounter: Secondary | ICD-10-CM | POA: Diagnosis present

## 2017-04-28 DIAGNOSIS — E871 Hypo-osmolality and hyponatremia: Secondary | ICD-10-CM

## 2017-04-28 DIAGNOSIS — L111 Transient acantholytic dermatosis [Grover]: Secondary | ICD-10-CM | POA: Diagnosis present

## 2017-04-28 DIAGNOSIS — D509 Iron deficiency anemia, unspecified: Secondary | ICD-10-CM | POA: Diagnosis present

## 2017-04-28 DIAGNOSIS — N400 Enlarged prostate without lower urinary tract symptoms: Secondary | ICD-10-CM | POA: Diagnosis present

## 2017-04-28 DIAGNOSIS — H353190 Nonexudative age-related macular degeneration, unspecified eye, stage unspecified: Secondary | ICD-10-CM | POA: Diagnosis present

## 2017-04-28 DIAGNOSIS — D649 Anemia, unspecified: Secondary | ICD-10-CM | POA: Diagnosis present

## 2017-04-28 DIAGNOSIS — Z860100 Personal history of colon polyps, unspecified: Secondary | ICD-10-CM

## 2017-04-28 DIAGNOSIS — R21 Rash and other nonspecific skin eruption: Secondary | ICD-10-CM | POA: Diagnosis not present

## 2017-04-28 DIAGNOSIS — Z7982 Long term (current) use of aspirin: Secondary | ICD-10-CM

## 2017-04-28 DIAGNOSIS — R350 Frequency of micturition: Secondary | ICD-10-CM

## 2017-04-28 DIAGNOSIS — Z79899 Other long term (current) drug therapy: Secondary | ICD-10-CM

## 2017-04-28 DIAGNOSIS — D500 Iron deficiency anemia secondary to blood loss (chronic): Secondary | ICD-10-CM | POA: Diagnosis not present

## 2017-04-28 DIAGNOSIS — Z66 Do not resuscitate: Secondary | ICD-10-CM | POA: Diagnosis present

## 2017-04-28 HISTORY — PX: INTRAMEDULLARY (IM) NAIL INTERTROCHANTERIC: SHX5875

## 2017-04-28 LAB — COMPREHENSIVE METABOLIC PANEL
ALK PHOS: 73 U/L (ref 38–126)
ALT: 22 U/L (ref 17–63)
ANION GAP: 9 (ref 5–15)
AST: 24 U/L (ref 15–41)
Albumin: 3.2 g/dL — ABNORMAL LOW (ref 3.5–5.0)
BUN: 9 mg/dL (ref 6–20)
CALCIUM: 8.7 mg/dL — AB (ref 8.9–10.3)
CO2: 22 mmol/L (ref 22–32)
Chloride: 98 mmol/L — ABNORMAL LOW (ref 101–111)
Creatinine, Ser: 0.64 mg/dL (ref 0.61–1.24)
GFR calc Af Amer: >60 mL/min (ref >60)
GFR calc non Af Amer: >60 mL/min (ref >60)
Glucose, Bld: 116 mg/dL — ABNORMAL HIGH (ref 65–99)
POTASSIUM: 3.5 mmol/L (ref 3.5–5.1)
SODIUM: 129 mmol/L — AB (ref 135–145)
Total Bilirubin: 0.7 mg/dL (ref 0.3–1.2)
Total Protein: 6.1 g/dL — ABNORMAL LOW (ref 6.5–8.1)

## 2017-04-28 LAB — CBC WITH DIFFERENTIAL/PLATELET
Basophils Absolute: 0.1 10*3/uL (ref 0.0–0.1)
Basophils Relative: 1 %
EOS ABS: 0.8 10*3/uL — AB (ref 0.0–0.7)
Eosinophils Relative: 8 %
HCT: 34.4 % — ABNORMAL LOW (ref 39.0–52.0)
Hemoglobin: 12.2 g/dL — ABNORMAL LOW (ref 13.0–17.0)
LYMPHS ABS: 1.1 10*3/uL (ref 0.7–4.0)
Lymphocytes Relative: 10 %
MCH: 32.5 pg (ref 26.0–34.0)
MCHC: 35.5 g/dL (ref 30.0–36.0)
MCV: 91.7 fL (ref 78.0–100.0)
MONO ABS: 0.7 10*3/uL (ref 0.1–1.0)
Monocytes Relative: 7 %
Neutro Abs: 7.9 10*3/uL — ABNORMAL HIGH (ref 1.7–7.7)
Neutrophils Relative %: 74 %
Platelets: 311 10*3/uL (ref 150–400)
RBC: 3.75 MIL/uL — ABNORMAL LOW (ref 4.22–5.81)
RDW: 13.1 % (ref 11.5–15.5)
WBC: 10.5 10*3/uL (ref 4.0–10.5)

## 2017-04-28 LAB — CBC
HEMATOCRIT: 29.4 % — AB (ref 39.0–52.0)
Hemoglobin: 10.3 g/dL — ABNORMAL LOW (ref 13.0–17.0)
MCH: 32.5 pg (ref 26.0–34.0)
MCHC: 35 g/dL (ref 30.0–36.0)
MCV: 92.7 fL (ref 78.0–100.0)
PLATELETS: 278 10*3/uL (ref 150–400)
RBC: 3.17 MIL/uL — ABNORMAL LOW (ref 4.22–5.81)
RDW: 12.8 % (ref 11.5–15.5)
WBC: 19.3 10*3/uL — AB (ref 4.0–10.5)

## 2017-04-28 LAB — TYPE AND SCREEN
ABO/RH(D): O POS
Antibody Screen: NEGATIVE

## 2017-04-28 LAB — CREATININE, SERUM
CREATININE: 0.79 mg/dL (ref 0.61–1.24)
GFR calc Af Amer: >60 mL/min (ref >60)
GFR calc non Af Amer: >60 mL/min (ref >60)

## 2017-04-28 LAB — ETHANOL: Alcohol, Ethyl (B): <10 mg/dL (ref <10)

## 2017-04-28 LAB — PROTIME-INR
INR: 0.92
Prothrombin Time: 12.3 seconds (ref 11.4–15.2)

## 2017-04-28 LAB — APTT: aPTT: 32 seconds (ref 24–36)

## 2017-04-28 LAB — ABO/RH: ABO/RH(D): O POS

## 2017-04-28 SURGERY — FIXATION, FRACTURE, INTERTROCHANTERIC, WITH INTRAMEDULLARY ROD
Anesthesia: General | Site: Leg Upper | Laterality: Right

## 2017-04-28 MED ORDER — THIAMINE HCL 100 MG/ML IJ SOLN
100.0000 mg | Freq: Every day | INTRAMUSCULAR | Status: DC
  Administered 2017-05-03: 100 mg via INTRAVENOUS
  Filled 2017-04-28: qty 2

## 2017-04-28 MED ORDER — SENNOSIDES-DOCUSATE SODIUM 8.6-50 MG PO TABS: 1.0000 | ORAL_TABLET | Freq: Every evening | ORAL | Status: DC | PRN

## 2017-04-28 MED ORDER — MENTHOL 3 MG MT LOZG: 1.0000 | LOZENGE | OROMUCOSAL | Status: DC | PRN

## 2017-04-28 MED ORDER — ONDANSETRON HCL 4 MG/2ML IJ SOLN
4.0000 mg | Freq: Once | INTRAMUSCULAR | Status: AC
  Administered 2017-04-28: 4 mg via INTRAVENOUS
  Filled 2017-04-28: qty 2

## 2017-04-28 MED ORDER — PHENOL 1.4 % MT LIQD: 1.0000 | OROMUCOSAL | Status: DC | PRN

## 2017-04-28 MED ORDER — ONDANSETRON HCL 4 MG/2ML IJ SOLN
INTRAMUSCULAR | Status: AC
  Filled 2017-04-28: qty 2

## 2017-04-28 MED ORDER — ROCURONIUM BROMIDE 10 MG/ML (PF) SYRINGE
PREFILLED_SYRINGE | INTRAVENOUS | Status: AC
  Filled 2017-04-28: qty 5

## 2017-04-28 MED ORDER — CEFAZOLIN SODIUM-DEXTROSE 2-4 GM/100ML-% IV SOLN
2.0000 g | Freq: Once | INTRAVENOUS | Status: AC
  Administered 2017-04-28: 2 g via INTRAVENOUS

## 2017-04-28 MED ORDER — SUGAMMADEX SODIUM 200 MG/2ML IV SOLN
INTRAVENOUS | Status: DC | PRN
  Administered 2017-04-28: 200 mg via INTRAVENOUS

## 2017-04-28 MED ORDER — DEXAMETHASONE SODIUM PHOSPHATE 10 MG/ML IJ SOLN
INTRAMUSCULAR | Status: AC
  Filled 2017-04-28: qty 1

## 2017-04-28 MED ORDER — VITAMIN B-1 100 MG PO TABS
100.0000 mg | ORAL_TABLET | Freq: Every day | ORAL | Status: DC
  Administered 2017-04-29 – 2017-05-02 (×4): 100 mg via ORAL
  Filled 2017-04-28 (×5): qty 1

## 2017-04-28 MED ORDER — CEFAZOLIN SODIUM-DEXTROSE 2-4 GM/100ML-% IV SOLN
2.0000 g | Freq: Four times a day (QID) | INTRAVENOUS | Status: AC
  Administered 2017-04-28 (×2): 2 g via INTRAVENOUS
  Filled 2017-04-28 (×5): qty 100

## 2017-04-28 MED ORDER — OXYCODONE HCL 5 MG PO TABS: 5.0000 mg | ORAL_TABLET | Freq: Once | ORAL | Status: DC | PRN

## 2017-04-28 MED ORDER — LIDOCAINE HCL (CARDIAC) 20 MG/ML IV SOLN
INTRAVENOUS | Status: DC | PRN
  Administered 2017-04-28: 50 mg via INTRAVENOUS

## 2017-04-28 MED ORDER — FENTANYL CITRATE (PF) 100 MCG/2ML IJ SOLN
INTRAMUSCULAR | Status: AC
  Administered 2017-04-28: 50 ug via INTRAVENOUS
  Filled 2017-04-28: qty 2

## 2017-04-28 MED ORDER — HYDROMORPHONE HCL 1 MG/ML IJ SOLN
0.5000 mg | Freq: Once | INTRAMUSCULAR | Status: AC
  Administered 2017-04-28: 0.5 mg via INTRAVENOUS
  Filled 2017-04-28: qty 1

## 2017-04-28 MED ORDER — DEXAMETHASONE SODIUM PHOSPHATE 10 MG/ML IJ SOLN
INTRAMUSCULAR | Status: DC | PRN
  Administered 2017-04-28: 5 mg via INTRAVENOUS

## 2017-04-28 MED ORDER — PHENYLEPHRINE HCL 10 MG/ML IJ SOLN
INTRAVENOUS | Status: DC | PRN
  Administered 2017-04-28: 50 ug/min via INTRAVENOUS

## 2017-04-28 MED ORDER — ACETAMINOPHEN 500 MG PO TABS
1000.0000 mg | ORAL_TABLET | Freq: Three times a day (TID) | ORAL | Status: AC
  Administered 2017-04-28 (×2): 1000 mg via ORAL
  Filled 2017-04-28 (×2): qty 2

## 2017-04-28 MED ORDER — CEFAZOLIN (ANCEF) 1 G IV SOLR: 2.0000 g | INTRAVENOUS | Status: DC

## 2017-04-28 MED ORDER — PROPOFOL 10 MG/ML IV BOLUS
INTRAVENOUS | Status: AC
  Filled 2017-04-28: qty 20

## 2017-04-28 MED ORDER — FENTANYL CITRATE (PF) 100 MCG/2ML IJ SOLN
25.0000 ug | INTRAMUSCULAR | Status: DC | PRN
  Administered 2017-04-28 (×2): 50 ug via INTRAVENOUS

## 2017-04-28 MED ORDER — HYDROCODONE-ACETAMINOPHEN 5-325 MG PO TABS: 1.0000 | ORAL_TABLET | Freq: Four times a day (QID) | ORAL | Status: DC | PRN

## 2017-04-28 MED ORDER — LORAZEPAM 2 MG/ML IJ SOLN
1.0000 mg | Freq: Four times a day (QID) | INTRAMUSCULAR | Status: AC | PRN
  Administered 2017-04-30 (×3): 1 mg via INTRAVENOUS
  Filled 2017-04-28 (×3): qty 1

## 2017-04-28 MED ORDER — ACETAMINOPHEN 10 MG/ML IV SOLN
INTRAVENOUS | Status: AC
  Administered 2017-04-28: 1000 mg via INTRAVENOUS
  Filled 2017-04-28: qty 100

## 2017-04-28 MED ORDER — LORAZEPAM 1 MG PO TABS
1.0000 mg | ORAL_TABLET | Freq: Four times a day (QID) | ORAL | Status: AC | PRN
  Administered 2017-04-30: 1 mg via ORAL
  Filled 2017-04-28 (×2): qty 1

## 2017-04-28 MED ORDER — AMLODIPINE BESYLATE 5 MG PO TABS
10.0000 mg | ORAL_TABLET | Freq: Every day | ORAL | Status: DC
  Administered 2017-04-29 – 2017-05-03 (×5): 10 mg via ORAL
  Filled 2017-04-28 (×6): qty 2

## 2017-04-28 MED ORDER — ACETAMINOPHEN 10 MG/ML IV SOLN
1000.0000 mg | Freq: Once | INTRAVENOUS | Status: AC
  Administered 2017-04-28: 1000 mg via INTRAVENOUS

## 2017-04-28 MED ORDER — FENTANYL CITRATE (PF) 100 MCG/2ML IJ SOLN
INTRAMUSCULAR | Status: DC | PRN
  Administered 2017-04-28: 100 ug via INTRAVENOUS
  Administered 2017-04-28 (×3): 50 ug via INTRAVENOUS

## 2017-04-28 MED ORDER — FOLIC ACID 1 MG PO TABS
1.0000 mg | ORAL_TABLET | Freq: Every day | ORAL | Status: DC
  Administered 2017-04-29 – 2017-05-02 (×4): 1 mg via ORAL
  Filled 2017-04-28 (×4): qty 1

## 2017-04-28 MED ORDER — FENTANYL CITRATE (PF) 250 MCG/5ML IJ SOLN
INTRAMUSCULAR | Status: AC
Start: 2017-04-28 — End: 2017-04-28
  Filled 2017-04-28: qty 5

## 2017-04-28 MED ORDER — ONDANSETRON HCL 4 MG/2ML IJ SOLN
INTRAMUSCULAR | Status: DC | PRN
  Administered 2017-04-28: 4 mg via INTRAVENOUS

## 2017-04-28 MED ORDER — ADULT MULTIVITAMIN W/MINERALS CH
1.0000 | ORAL_TABLET | Freq: Every day | ORAL | Status: DC
  Administered 2017-04-29 – 2017-05-03 (×5): 1 via ORAL
  Filled 2017-04-28 (×5): qty 1

## 2017-04-28 MED ORDER — ROCURONIUM BROMIDE 100 MG/10ML IV SOLN
INTRAVENOUS | Status: DC | PRN
  Administered 2017-04-28: 40 mg via INTRAVENOUS

## 2017-04-28 MED ORDER — LIDOCAINE 2% (20 MG/ML) 5 ML SYRINGE
INTRAMUSCULAR | Status: AC
  Filled 2017-04-28: qty 5

## 2017-04-28 MED ORDER — PROPOFOL 10 MG/ML IV BOLUS
INTRAVENOUS | Status: DC | PRN
  Administered 2017-04-28 (×3): 10 mg via INTRAVENOUS
  Administered 2017-04-28: 100 mg via INTRAVENOUS

## 2017-04-28 MED ORDER — SUGAMMADEX SODIUM 200 MG/2ML IV SOLN
INTRAVENOUS | Status: AC
  Filled 2017-04-28: qty 2

## 2017-04-28 MED ORDER — OXYCODONE HCL 5 MG PO TABS
5.0000 mg | ORAL_TABLET | ORAL | Status: DC | PRN
  Administered 2017-04-28 – 2017-05-02 (×10): 10 mg via ORAL
  Administered 2017-05-02: 5 mg via ORAL
  Administered 2017-05-03: 10 mg via ORAL
  Filled 2017-04-28 (×13): qty 2

## 2017-04-28 MED ORDER — SPIRONOLACTONE 12.5 MG HALF TABLET: 12.5000 mg | ORAL_TABLET | Freq: Every day | ORAL | Status: DC

## 2017-04-28 MED ORDER — BISACODYL 5 MG PO TBEC: 5.0000 mg | DELAYED_RELEASE_TABLET | Freq: Every day | ORAL | Status: DC | PRN

## 2017-04-28 MED ORDER — ENOXAPARIN SODIUM 40 MG/0.4ML ~~LOC~~ SOLN
40.0000 mg | SUBCUTANEOUS | Status: DC
  Administered 2017-04-29 – 2017-05-03 (×5): 40 mg via SUBCUTANEOUS
  Filled 2017-04-28 (×4): qty 0.4

## 2017-04-28 MED ORDER — ALBUMIN HUMAN 5 % IV SOLN
INTRAVENOUS | Status: DC | PRN
  Administered 2017-04-28: 11:00:00 via INTRAVENOUS

## 2017-04-28 MED ORDER — SODIUM CHLORIDE 0.9 % IV SOLN
INTRAVENOUS | Status: DC
  Administered 2017-04-28 – 2017-05-01 (×3): via INTRAVENOUS
  Administered 2017-05-01: 75 mL/h via INTRAVENOUS
  Administered 2017-05-01: 12:00:00 via INTRAVENOUS

## 2017-04-28 MED ORDER — OXYCODONE HCL 5 MG/5ML PO SOLN: 5.0000 mg | Freq: Once | ORAL | Status: DC | PRN

## 2017-04-28 MED ORDER — FLEET ENEMA 7-19 GM/118ML RE ENEM: 1.0000 | ENEMA | Freq: Once | RECTAL | Status: DC | PRN

## 2017-04-28 MED ORDER — DOXAZOSIN MESYLATE 8 MG PO TABS
8.0000 mg | ORAL_TABLET | Freq: Every day | ORAL | Status: DC
  Administered 2017-04-29 – 2017-05-03 (×5): 8 mg via ORAL
  Filled 2017-04-28 (×5): qty 1

## 2017-04-28 MED ORDER — HYDROMORPHONE HCL 1 MG/ML IJ SOLN
0.5000 mg | INTRAMUSCULAR | Status: DC | PRN
  Administered 2017-04-29 – 2017-05-01 (×6): 0.5 mg via INTRAVENOUS
  Filled 2017-04-28 (×6): qty 1

## 2017-04-28 MED ORDER — 0.9 % SODIUM CHLORIDE (POUR BTL) OPTIME
TOPICAL | Status: DC | PRN
  Administered 2017-04-28: 1000 mL

## 2017-04-28 MED ORDER — CEFAZOLIN SODIUM-DEXTROSE 2-4 GM/100ML-% IV SOLN
INTRAVENOUS | Status: AC
  Filled 2017-04-28: qty 100

## 2017-04-28 SURGICAL SUPPLY — 41 items
BIT DRILL AO GAMMA 4.2X180 (BIT) ×2 IMPLANT
BNDG COHESIVE 4X5 TAN STRL (GAUZE/BANDAGES/DRESSINGS) ×3 IMPLANT
BNDG GAUZE ELAST 4 BULKY (GAUZE/BANDAGES/DRESSINGS) ×3 IMPLANT
CLOSURE WOUND 1/2 X4 (GAUZE/BANDAGES/DRESSINGS) ×1
COVER PERINEAL POST (MISCELLANEOUS) ×3 IMPLANT
COVER SURGICAL LIGHT HANDLE (MISCELLANEOUS) ×3 IMPLANT
DRAPE STERI IOBAN 125X83 (DRAPES) ×3 IMPLANT
DRSG MEPILEX BORDER 4X4 (GAUZE/BANDAGES/DRESSINGS) ×8 IMPLANT
DURAPREP 26ML APPLICATOR (WOUND CARE) ×3 IMPLANT
ELECT REM PT RETURN 9FT ADLT (ELECTROSURGICAL) ×3
ELECTRODE REM PT RTRN 9FT ADLT (ELECTROSURGICAL) ×1 IMPLANT
GLOVE BIOGEL PI IND STRL 8 (GLOVE) ×1 IMPLANT
GLOVE BIOGEL PI INDICATOR 8 (GLOVE) ×2
GLOVE ECLIPSE 8.0 STRL XLNG CF (GLOVE) ×6 IMPLANT
GOWN STRL REUS W/ TWL LRG LVL3 (GOWN DISPOSABLE) ×3 IMPLANT
GOWN STRL REUS W/TWL LRG LVL3 (GOWN DISPOSABLE) ×9
GUIDEROD T2 3X1000 (ROD) ×2 IMPLANT
K-WIRE  3.2X450M STR (WIRE) ×4
K-WIRE 3.2X450M STR (WIRE) ×2
KIT ROOM TURNOVER OR (KITS) ×3 IMPLANT
KWIRE 3.2X450M STR (WIRE) IMPLANT
MANIFOLD NEPTUNE II (INSTRUMENTS) ×3 IMPLANT
NAIL GAMMA LG R 5TI 10X360X125 (Nail) ×2 IMPLANT
NS IRRIG 1000ML POUR BTL (IV SOLUTION) ×3 IMPLANT
PACK GENERAL/GYN (CUSTOM PROCEDURE TRAY) ×3 IMPLANT
PAD ARMBOARD 7.5X6 YLW CONV (MISCELLANEOUS) ×7 IMPLANT
PAD CAST 4YDX4 CTTN HI CHSV (CAST SUPPLIES) ×1 IMPLANT
PADDING CAST COTTON 4X4 STRL (CAST SUPPLIES) ×3
REAMER SHAFT BIXCUT (INSTRUMENTS) ×2 IMPLANT
SCREW LAG GAMMA 3 95MM (Screw) ×2 IMPLANT
SCREW LOCKING T2 F/T  5MMX40MM (Screw) ×2 IMPLANT
SCREW LOCKING T2 F/T 5MMX40MM (Screw) IMPLANT
STRIP CLOSURE SKIN 1/2X4 (GAUZE/BANDAGES/DRESSINGS) ×1 IMPLANT
SUT MNCRL AB 4-0 PS2 18 (SUTURE) ×2 IMPLANT
SUT MON AB 2-0 CT1 27 (SUTURE) ×1 IMPLANT
SUT VIC AB 0 CT1 27 (SUTURE) ×3
SUT VIC AB 0 CT1 27XBRD ANBCTR (SUTURE) ×1 IMPLANT
SUT VIC AB 3-0 CT1 27 (SUTURE) ×3
SUT VIC AB 3-0 CT1 TAPERPNT 27 (SUTURE) IMPLANT
TOWEL OR 17X24 6PK STRL BLUE (TOWEL DISPOSABLE) ×1 IMPLANT
TOWEL OR 17X26 10 PK STRL BLUE (TOWEL DISPOSABLE) ×3 IMPLANT

## 2017-04-28 NOTE — ED Provider Notes (Signed)
[facility] EMERGENCY DEPARTMENT Provider Note   CSN: [id number] Arrival date & time: 04/28/17  0555     History   Chief Complaint Chief Complaint  Patient presents with  . Hip Pain  . Fall     HPI   Height 5\' 8"  (1.727 m), weight 72.6 kg (160 lb), SpO2 100 %.  Elijah Nixon is a 84 y.o. male complaining of right hip pain status post fall.  Patient got up to use the restroom in the middle of the night, he fell, there was no other head trauma, cervicalgia, chest pain or abdominal pain, has been nonambulatory since the event, pain is moderate and exacerbated by movement and palpation.  He was given 50 mcg of fentanyl in route by EMS and states the pain is improved.  Last food intake was last night at 7 PM.  He last drank alcohol at 10 PM last night, he drinks beer and he drinks beer daily.  He is never had any seizures DTs from alcohol withdrawal  Ortho: Eulah PontMurphy  Past Medical History:  Diagnosis Date  . AA (alcohol abuse)    at least 8-10 drinks/day  . Arthritis    OA  . DDD (degenerative disc disease)   . ED (erectile dysfunction)   . Grover's disease   . History of colon polyps   . History of diverticulitis of colon   . Hypertension   . Macular degeneration    dry  . Osteoporosis    spinal compression fx    Patient Active Problem List   Diagnosis Date Noted  . Prediabetes 10/17/2016  . Anemia 10/17/2016  . 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 BPH (benign prostatic hyperplasia) 10/07/2016  . Rash and nonspecific skin eruption 08/30/2016  . Lumbar disc disease 05/06/2015  . Leg pain, bilateral 05/05/2015  . Routine general medical examination at a health care facility 10/02/2014  . Colon cancer screening 09/29/2013  . Encounter for Medicare annual wellness exam 09/27/2012  . Seborrheic keratosis 03/29/2012  . SWELLING, MASS, OR LUMP IN CHEST 06/24/2009  . ERECTILE DYSFUNCTION 02/16/2009  . ACTINIC KERATOSIS 02/16/2009  . ADVERSE DRUG REACTION  11/13/2008  . Alcohol abuse 10/27/2008  . Essential hypertension 10/27/2008  . GEN OSTEOARTHROSIS INVOLVING MULTIPLE SITES 10/27/2008  . Osteoporosis 10/27/2008  . COLONIC POLYPS, HX OF 10/27/2008  . DIVERTICULITIS, HX OF 10/27/2008    Past Surgical History:  Procedure Laterality Date  . SPINE SURGERY  1971   deg disc disease x 2       Home Medications    Prior to Admission medications   Medication Sig Start Date End Date Taking? Authorizing Provider  amLODipine (NORVASC) 10 MG tablet TAKE 1 TABLET BY MOUTH DAILY 10/17/16  Yes Tower, Audrie GallusMarne A, MD  aspirin 81 MG tablet Take 81 mg by mouth daily.     Yes [provider]  b complex vitamins tablet Take 1 tablet by mouth daily.   Yes [provider]  Calcium Carbonate-Vit D-Min 600-400 MG-UNIT TABS Take 1 tablet by mouth daily.    Yes [provider]  Cyanocobalamin (VITAMIN B-12 PO) Take 1 tablet by mouth daily.   Yes [provider]  doxazosin (CARDURA) 8 MG tablet TAKE ONE (1) TABLET BY MOUTH EVERY DAY 10/17/16  Yes Tower, Audrie GallusMarne A, MD  ibuprofen (ADVIL,MOTRIN) 200 MG tablet Take 600 mg by mouth 2 (two) times daily.   Yes [provider]  Multiple Vitamins-Minerals (PRESERVISION/LUTEIN) CAPS Take 1 capsule by mouth daily.  Yes [provider]  spironolactone (ALDACTONE) 25 MG tablet Take 0.5 tablets (12.5 mg total) by mouth daily. 10/17/16  Yes Tower, Audrie Gallus, MD  hydrOXYzine (ATARAX/VISTARIL) 25 MG tablet Take 0.5-1 tablets (12.5-25 mg total) by mouth at bedtime as needed for itching. Patient not taking: Reported on 04/28/2017 08/30/16   Tower, Audrie Gallus, MD  methocarbamol (ROBAXIN) 500 MG tablet Take 1 or 2 po Q 6hrs for muscle spasms or pain Patient not taking: Reported on 04/28/2017 02/15/17   Devoria Albe, MD  PERCOCET 5-325 MG tablet Take 1 tablet by mouth every 6 (six) hours as needed for severe pain. Patient not taking: Reported on 04/28/2017 02/15/17   Devoria Albe, MD    Family  History Family History  Problem Relation Age of Onset  . Diabetes Son     Social History Social History   Tobacco Use  . Smoking status: Never Smoker  . Smokeless tobacco: Never Used  Substance Use Topics  . Alcohol use: Yes    Alcohol/week: 0.0 oz  . Drug use: No     Allergies   Lisinopril   Review of Systems Review of Systems  A complete review of systems was obtained and all systems are negative except as noted in the HPI and PMH.   Physical Exam Updated Vital Signs BP (!) 151/83 (BP Location: Right Arm)   Pulse 85   Temp 97.6 F (36.4 C) (Oral)   Resp 16   Ht 5\' 8"  (1.727 m)   Wt 72.6 kg (160 lb)   SpO2 92%   BMI 24.33 kg/m   Physical Exam  Constitutional: He is oriented to person, place, and time. He appears well-developed and well-nourished. No distress.  HENT:  Head: Normocephalic and atraumatic.  Mouth/Throat: Oropharynx is clear and moist.  Eyes: Conjunctivae and EOM are normal. Pupils are equal, round, and reactive to light.  Neck: Normal range of motion.  No midline C-spine  tenderness to palpation or step-offs appreciated. Patient has full range of motion without pain.  Grip strength, biceps, triceps 5/5 bilaterally;  can differentiate between pinprick and light touch bilaterally.   Cardiovascular: Normal rate, regular rhythm and intact distal pulses.  Pulmonary/Chest: Effort normal and breath sounds normal. No stridor. No respiratory distress. He has no wheezes. He has no rales. He exhibits no tenderness.  Abdominal: Soft. There is no tenderness.  Musculoskeletal: Normal range of motion.  Right greater troch +TTP. Right left shortened and externally rotated. DP pulse 2+ cap refill brisk. FROM toes  Neurological: He is alert and oriented to person, place, and time.  Skin: He is not diaphoretic.  Psychiatric: He has a normal mood and affect.  Nursing note and vitals reviewed.    ED Treatments / Results  Labs (all labs ordered are listed,  but only abnormal results are displayed) Labs Reviewed  CBC WITH DIFFERENTIAL/PLATELET - Abnormal; Notable for the following components:      Result Value   RBC 3.75 (*)    Hemoglobin 12.2 (*)    HCT 34.4 (*)    Neutro Abs 7.9 (*)    Eosinophils Absolute 0.8 (*)    All other components within normal limits  COMPREHENSIVE METABOLIC PANEL - Abnormal; Notable for the following components:   Sodium 129 (*)    Chloride 98 (*)    Glucose, Bld 116 (*)    Calcium 8.7 (*)    Total Protein 6.1 (*)    Albumin 3.2 (*)    All other components within  normal limits  ETHANOL    EKG  EKG Interpretation None       Radiology Dg Hip Unilat  With Pelvis 2-3 Views Right  Result Date: 04/28/2017 CLINICAL DATA:  Pain after fall EXAM: DG HIP (WITH OR WITHOUT PELVIS) 2-3V RIGHT COMPARISON:  None. FINDINGS: There is a comminuted displaced and angulated fracture in the right hip through the intertrochanteric region. No dislocation identified. No other fractures are seen. IMPRESSION: Comminuted displaced and angulated right intertrochanteric hip fracture. Electronically Signed   By: Gerome Sam III M.D   On: 04/28/2017 07:31    Procedures Procedures (including critical care time)  Medications Ordered in ED Medications  HYDROmorphone (DILAUDID) injection 0.5 mg (0.5 mg Intravenous Given 04/28/17 0819)  ondansetron (ZOFRAN) injection 4 mg (4 mg Intravenous Given 04/28/17 0818)     Initial Impression / Assessment and Plan / ED Course  I have reviewed the triage vital signs and the nursing notes.  Pertinent labs & imaging results that were available during my care of the patient were reviewed by me and considered in my medical decision making (see chart for details).     Vitals:   04/28/17 0555 04/28/17 0602 04/28/17 0737  BP:   (!) 151/83  Pulse:   85  Resp:   16  Temp:   97.6 F (36.4 C)  TempSrc:   Oral  SpO2: 100%  92%  Weight:  72.6 kg (160 lb)   Height:  5\' 8"  (1.727 m)      Medications  HYDROmorphone (DILAUDID) injection 0.5 mg (0.5 mg Intravenous Given 04/28/17 0819)  ondansetron (ZOFRAN) injection 4 mg (4 mg Intravenous Given 04/28/17 0818)   Elijah Nixon is 84 y.o. male presenting with hip pain status post mechanical fall.  Not anticoagulated, neurovascularly intact, right lower extremity shortened and rotated.  X-ray with comminuted displaced intertrochanteric fracture.  Orthopedic consult from Dr. Everardo Pacific appreciated: Given that this patient has been n.p.o. since last night is not anticoagulated he is amenable to taking him to the OR today.  Recommends hospitalist to a preop clearance.  He indicated to hospitalist that he will need to call back when patient is cleared so that we will be able to put him on the OR schedule.     Final Clinical Impressions(s) / ED Diagnoses   Final diagnoses:  Closed right hip fracture, initial encounter [facility])    ED Discharge Orders    None       Lynetta Mare Mardella Layman 04/28/17 0820    Raeford Razor, MD 04/28/17 [phone]

## 2017-04-28 NOTE — Anesthesia Postprocedure Evaluation (Signed)
Anesthesia Post Note  Patient: [name]  Procedure(s) Performed: INTRAMEDULLARY (IM) NAIL INTERTROCHANTRIC (Right Leg Upper)     Patient location during evaluation: PACU Anesthesia Type: General Level of consciousness: awake and alert Pain management: pain level controlled Vital Signs Assessment: post-procedure vital signs reviewed and stable Respiratory status: spontaneous breathing, nonlabored ventilation, respiratory function stable and patient connected to nasal cannula oxygen Cardiovascular status: blood pressure returned to baseline and stable Postop Assessment: no apparent nausea or vomiting Anesthetic complications: no    Last Vitals:  Vitals:   04/28/17 1251 04/28/17 1312  BP: 136/66 140/61  Pulse: 82 82  Resp: 19 20  Temp: 36.6 C (!) 36.4 C  SpO2: 93% 98%    Last Pain:  Vitals:   04/28/17 1312  TempSrc: Oral  PainSc:                  Lashara Urey

## 2017-04-28 NOTE — Progress Notes (Signed)
PT Cancellation Note  Patient Details Name: Elijah RasmussenBilly W Nixon MRN: [id number] DOB: 1932-09-06   Cancelled Treatment:    Reason Eval/Treat Not Completed: Patient not medically ready, in OR, will follow   Fabio AsaDevon J Jerald Hennington 04/28/2017, 10:44 AM

## 2017-04-28 NOTE — Anesthesia Procedure Notes (Signed)
Procedure Name: Intubation Date/Time: 04/28/2017 10:02 AM Performed by: Kyung Rudd, CRNA Pre-anesthesia Checklist: Patient identified, Emergency Drugs available, Suction available and Patient being monitored Patient Re-evaluated:Patient Re-evaluated prior to induction Oxygen Delivery Method: Circle system utilized Preoxygenation: Pre-oxygenation with 100% oxygen Induction Type: IV induction Ventilation: Mask ventilation without difficulty and Oral airway inserted - appropriate to patient size Laryngoscope Size: Mac and 4 Grade View: Grade I Tube type: Oral Tube size: 7.5 mm Number of attempts: 1 Airway Equipment and Method: Stylet Placement Confirmation: ETT inserted through vocal cords under direct vision,  positive ETCO2 and breath sounds checked- equal and bilateral Secured at: 21 cm Tube secured with: Tape Dental Injury: Teeth and Oropharynx as per pre-operative assessment

## 2017-04-28 NOTE — Op Note (Signed)
Orthopaedic Surgery Operative Note (CSN: [id number])  Elijah RasmussenBilly W Nixon  11-Dec-1932 Date of Surgery: 04/28/2017   Diagnoses:  intertrochanteric right femur fracture  Procedure: Cephallomedullary nail R femur [id number]    Operative Finding Successful completion of planned procedure.  Good apposition of fracture, lesser separate fragment.  Post-operative plan: The patient will be WBAT.  The patient will be admitted to hospitalist.  DVT prophylaxis Lovenox 40mg  qd for 2 weeks and we will reassess in clinic.  Pain control with PRN pain medication preferring oral medicines.  Follow up plan will be scheduled in approximately 10-14 days for wound check.  Post-Op Diagnosis: Same Surgeons:Primary: Elijah PippinVarkey, Elijah Fulfer T, MD Assistants:None [facility] Anesthesia: General Antibiotics: Ancef 2g preop Tourniquet time: * No tourniquets in log * Estimated Blood Loss: 200 Complications: None Specimens: None Implants: Implant Name Type Inv. Item Serial No. Manufacturer Lot No. LRB No. Used Action  NAIL GAMMA LG R 5TI [id number] - [id number] Nail NAIL GAMMA LG R 5TI [id number]  STRYKER TRAUMA [id number] Right 1 Implanted  SCREW LAG GAMMA 3 95MM - [id number] Screw SCREW LAG GAMMA 3 95MM  STRYKER TRAUMA [id number] Right 1 Implanted  SCREW LOCKING T2 F/Nixon  5MMX40MM - [id number] Screw SCREW LOCKING T2 F/Nixon  5MMX40MM  STRYKER TRAUMA [id number] Right 1 Implanted    Indications for Surgery:   Elijah Nixon is a 84 y.o. male with fall resulting in R 4 part intertroch fracture.  Benefits and risks of operative and nonoperative management were discussed prior to surgery with patient/guardian(s) and informed consent form was completed.  Specific risks including infection, need for additional surgery, withdrawals from ETOH, periprosthetic fracture.   Procedure:   The patient was identified in the preoperative holding area where the surgical site was marked. The patient was taken to the OR where a procedural timeout was  called and the above noted anesthesia was induced.  The patient was positioned supine on fracture table..  Preoperative antibiotics were dosed.  The patient's right hip was prepped and draped in the usual sterile fashion.  A second preoperative timeout was called.      The patient was placed supine on a fracture table and appropriate reduction was obtained and visualized on fluoroscopy prior to the beginning of the procedure.  We made an incision proximal to the greater trochanter and dissected down through the fascia.  We then carefully placed our starting awl with cutting edges to 15mm localizing under fluoroscopy prior to advancing the awl into the bone and sliding a ball-tipped guidewire through the awl into the femoral canal.  The wire was passed to an appropriate level at the fascial scar of the distal femur and measurement was obtained proximally using fluoroscopy.  We selected a length of nail noted above.  Entry reamer was not needed as the awl had a built in opening reamer built in.  At this point we placed our nail localizing under fluoroscopy that it was at the appropriate level prior to using the outrigger device to pass a wire and then the cephalo-medullary screw after placement of a secondary k wire to hold our reduction and avoid spinning the head fragment.  The screw was locked proximally to avoid over collapse.  We took final shots at the proximal femur and then used perfect circle technique to place one distal interlock screw.  Final pictures were obtained.  The wounds were thoroughly irrigated closed in a multilayer fashion with absorable sutures.  A sterile dressing was placed.  The  patient was awoken from general anesthesia and taken to the PACU in stable condition without complication.

## 2017-04-28 NOTE — Consult Note (Signed)
ORTHOPAEDIC CONSULTATION  REQUESTING PHYSICIAN: Elwin Mocha, MD  Chief Complaint: R hip fracture  HPI: Elijah Nixon is a 84 y.o. male who complains of  Fall resulting in R intertroch femur fracture.  [facility] called because as patient is existing patient of Dr. Octavia Bruckner Murphy's.  No other areas of injury.  Discussed with Dr. Aggie Moats at approximately 8:45 am that patient was appropriate for surgery today and was NPO so attempting to facilitate a convenient process for the patient.  No further testing or treatments suggested by hospitalist team prior to surgery.  Does describe pain in R hip, no other obvious areas of pain.   Patient pertinently has a history of EtOH abuse drinking 7-8 beers per day.  Past Medical History:  Diagnosis Date  . AA (alcohol abuse)    at least 8-10 drinks/day  . Arthritis    OA  . DDD (degenerative disc disease)   . ED (erectile dysfunction)   . Grover's disease   . History of colon polyps   . History of diverticulitis of colon   . Hypertension   . Macular degeneration    dry  . Osteoporosis    spinal compression fx   Past Surgical History:  Procedure Laterality Date  . BACK SURGERY    . SPINE SURGERY  1971   deg disc disease x 2   Social History   Socioeconomic History  . Marital status: Married    Spouse name: None  . Number of children: None  . Years of education: None  . Highest education level: None  Social Needs  . Financial resource strain: None  . Food insecurity - worry: None  . Food insecurity - inability: None  . Transportation needs - medical: None  . Transportation needs - non-medical: None  Occupational History  . None  Tobacco Use  . Smoking status: Never Smoker  . Smokeless tobacco: Never Used  Substance and Sexual Activity  . Alcohol use: Yes    Alcohol/week: 6.0 oz    Types: 10 Cans of beer per week    Comment: 10 cans throughout the day   . Drug use: No  . Sexual activity: No  Other Topics  Concern  . None  Social History Narrative  . None   Family History  Problem Relation Age of Onset  . Diabetes Son    Allergies  Allergen Reactions  . Lisinopril     REACTION: increased potassium leveles   Prior to Admission medications   Medication Sig Start Date End Date Taking? Authorizing Provider  amLODipine (NORVASC) 10 MG tablet TAKE 1 TABLET BY MOUTH DAILY 10/17/16  Yes Tower, Wynelle Fanny, MD  aspirin 81 MG tablet Take 81 mg by mouth daily.     Yes [provider]  b complex vitamins tablet Take 1 tablet by mouth daily.   Yes [provider]  Calcium Carbonate-Vit D-Min 600-400 MG-UNIT TABS Take 1 tablet by mouth daily.    Yes [provider]  Cyanocobalamin (VITAMIN B-12 PO) Take 1 tablet by mouth daily.   Yes [provider]  doxazosin (CARDURA) 8 MG tablet TAKE ONE (1) TABLET BY MOUTH EVERY DAY 10/17/16  Yes Tower, Wynelle Fanny, MD  ibuprofen (ADVIL,MOTRIN) 200 MG tablet Take 600 mg by mouth 2 (two) times daily.   Yes [provider]  Multiple Vitamins-Minerals (PRESERVISION/LUTEIN) CAPS Take 1 capsule by mouth daily.     Yes [provider]  spironolactone (ALDACTONE) 25  MG tablet Take 0.5 tablets (12.5 mg total) by mouth daily. 10/17/16  Yes Tower, Wynelle Fanny, MD  hydrOXYzine (ATARAX/VISTARIL) 25 MG tablet Take 0.5-1 tablets (12.5-25 mg total) by mouth at bedtime as needed for itching. Patient not taking: Reported on 04/28/2017 08/30/16   Tower, Wynelle Fanny, MD  methocarbamol (ROBAXIN) 500 MG tablet Take 1 or 2 po Q 6hrs for muscle spasms or pain Patient not taking: Reported on 04/28/2017 02/15/17   Rolland Porter, MD  PERCOCET 5-325 MG tablet Take 1 tablet by mouth every 6 (six) hours as needed for severe pain. Patient not taking: Reported on 04/28/2017 02/15/17   Rolland Porter, MD   Dg Hip Unilat  With Pelvis 2-3 Views Right  Result Date: 04/28/2017 CLINICAL DATA:  Pain after fall EXAM: DG HIP (WITH OR WITHOUT PELVIS) 2-3V RIGHT COMPARISON:  None.  FINDINGS: There is a comminuted displaced and angulated fracture in the right hip through the intertrochanteric region. No dislocation identified. No other fractures are seen. IMPRESSION: Comminuted displaced and angulated right intertrochanteric hip fracture. Electronically Signed   By: Dorise Bullion III M.D   On: 04/28/2017 07:31   Family History Reviewed and non-contributory, no pertinent history of problems with bleeding or anesthesia      Review of Systems 14 system ROS conducted and negative except for that noted in HPI   OBJECTIVE  Vitals: Patient Vitals for the past 8 hrs:  BP Temp Temp src Pulse Resp SpO2 Height Weight  04/28/17 0737 (!) 151/83 97.6 F (36.4 C) Oral 85 16 92 % - -  04/28/17 0602 - - - - - - _0  (1.727 m) 72.6 kg (160 lb)  04/28/17 0555 - - - - - 100 % - -   General: Alert, no acute distress Cardiovascular: No pedal edema Respiratory: No cyanosis, no use of accessory musculature GI: No organomegaly, abdomen is soft and non-tender Skin: No lesions in the area of chief complaint other than those listed below in MSK exam.  Neurologic: Sensation intact distally save for the below mentioned MSK exam Psychiatric: Patient is competent for consent with normal mood and affect Lymphatic: No axillary or cervical lymphadenopathy Extremities  RLE: Shortened and externally rotated.  ROM deferred. + GS/TA/EHL. Sensation intact in DP/SP/S/S/P distributions. 2+ DP pulse with warm and well perfused digits. Compartments soft and compressible, with no pain on passive stretch    Test Results Imaging XR reviewed demonstrating 4 part intertrochanteric femur fracture with lesser involvement. Labs cbc Recent Labs    04/28/17 0632  WBC 10.5  HGB 12.2*  HCT 34.4*  PLT 311    Labs inflam No results for input(s): CRP in the last 72 hours.  Invalid input(s): ESR  Labs coag No results for input(s): INR, PTT in the last 72 hours.  Invalid input(s): PT  Recent Labs      04/28/17 0632  NA 129*  K 3.5  CL 98*  CO2 22  GLUCOSE 116*  BUN 9  CREATININE 0.64  CALCIUM 8.7*     ASSESSMENT AND PLAN: 84 y.o. male with the following: Right intertrochanteric femur fracture  Orthopedics recommends admission to a medical service and we will provide consultation and follow along  The risks benefits and alternatives were discussed with the patient including but not limited to the risks of nonoperative treatment, versus surgical intervention including infection, bleeding, nerve injury, periprosthetic fracture, the need for revision surgery, leg length discrepancy, gait change, blood clots, cardiopulmonary complications, morbidity, mortality, among others, and they  were willing to proceed.    Specifically we talked with his family member Hassan Rowan and the patient EtOH withdrawals and they are aware that with his heavy drinking this may be an issue.   Plan for Cephallomedullary nail R femur today. Postop Plan: Weightbearing: WBAT RLE Insicional and dressing care: OK to remove dressings POD3 and leave open to air with dry gauze PRN Orthopedic device(s): None Showering: POD3 VTE prophylaxis: Lovenox 20m qd 2 weeks postop, will reassess in clinic whether to transition to aspirin at that point. Pain control: Scheduled tylenol and PRN Oxycodone with breakthrough morphine Follow - up plan: 2 weeks Contact information:  DOphelia CharterMD, Business hours: [phone]  Weekends/Nights/Holidays please check [email or web address] for Sports Med group call.

## 2017-04-28 NOTE — Progress Notes (Signed)
Orthopedic Tech Progress Note Patient Details:  Elijah RasmussenBilly W Nixon 1933/02/28 [id number]  Ortho Devices Ortho Device/Splint Location: Trapeze bar Ortho Device/Splint Interventions: Application   Post Interventions Patient Tolerated: Well Instructions Provided: Adjustment of device, Care of device   [name] 04/28/2017, 6:42 PM

## 2017-04-28 NOTE — ED Triage Notes (Signed)
Pt arrives via EMS; per EMS pt woke up and went to the bathroom and tripped coming back to the bed; pt landed on right side and has swelling to right side with shorten and rotation to right hip; Pt received 150 mcg Fentanyl en route to ED; Pt from home with spouse; pt denies use of assistive device at home; Pt state he fell a few months ago and fx wrist; Pt is a&ox 4 on arrival.-Monique,RN

## 2017-04-28 NOTE — Transfer of Care (Signed)
Immediate Anesthesia Transfer of Care Note  Patient: Elijah Nixon  Procedure(s) Performed: INTRAMEDULLARY (IM) NAIL INTERTROCHANTRIC (Right Leg Upper)  Patient Location: PACU  Anesthesia Type:General  Level of Consciousness: awake, alert  and oriented  Airway & Oxygen Therapy: Patient Spontanous Breathing and Patient connected to nasal cannula oxygen  Post-op Assessment: Report given to RN, Post -op Vital signs reviewed and stable and Patient moving all extremities  Post vital signs: Reviewed and stable  Last Vitals:  Vitals:   04/28/17 0845 04/28/17 1136  BP: 125/64 (!) 154/79  Pulse: 79 80  Resp: 14 18  Temp:    SpO2: 91% 100%    Last Pain:  Vitals:   04/28/17 0737  TempSrc: Oral  PainSc:          Complications: No apparent anesthesia complications

## 2017-04-28 NOTE — Plan of Care (Signed)
  Progressing Education: Knowledge of General Education information will improve 04/28/2017 1451 - Progressing by Darreld Mcleanox, Shyan Scalisi, RN Health Behavior/Discharge Planning: Ability to manage health-related needs will improve 04/28/2017 1451 - Progressing by Darreld Mcleanox, Rula [place], RN Clinical Measurements: Ability to maintain clinical measurements within normal limits will improve 04/28/2017 1451 - Progressing by Darreld Mcleanox, Remer Couse, RN Will remain free from infection 04/28/2017 1451 - Progressing by Darreld Mcleanox, Christepher Melchior, RN Diagnostic test results will improve 04/28/2017 1451 - Progressing by Darreld Mcleanox, Kino Dunsworth, RN Respiratory complications will improve 04/28/2017 1451 - Progressing by Darreld Mcleanox, Yamaira Spinner, RN Cardiovascular complication will be avoided 04/28/2017 1451 - Progressing by Darreld Mcleanox, Rosbel Buckner, RN Activity: Risk for activity intolerance will decrease 04/28/2017 1451 - Progressing by Darreld Mcleanox, Cartel Mauss, RN Nutrition: Adequate nutrition will be maintained 04/28/2017 1451 - Progressing by Darreld Mcleanox, Rajvi Armentor, RN Coping: Level of anxiety will decrease 04/28/2017 1451 - Progressing by Darreld Mcleanox, Yuliya Nova, RN Elimination: Will not experience complications related to bowel motility 04/28/2017 1451 - Progressing by Darreld Mcleanox, Brigitt Mcclish, RN Will not experience complications related to urinary retention 04/28/2017 1451 - Progressing by Darreld Mcleanox, Ryu Cerreta, RN Pain Managment: General experience of comfort will improve 04/28/2017 1451 - Progressing by Darreld Mcleanox, Caresse Sedivy, RN Safety: Ability to remain free from injury will improve 04/28/2017 1451 - Progressing by Darreld Mcleanox, Naome Brigandi, RN Skin Integrity: Risk for impaired skin integrity will decrease 04/28/2017 1451 - Progressing by Darreld Mcleanox, Vernal Hritz, RN   Progressing Education: Knowledge of General Education information will improve 04/28/2017 1451 - Progressing by Darreld Mcleanox, Imanni Burdine, RN Health Behavior/Discharge Planning: Ability to manage health-related needs will improve 04/28/2017 1451 - Progressing by Darreld Mcleanox, Ronan Duecker, RN Clinical Measurements: Ability to maintain clinical measurements within  normal limits will improve 04/28/2017 1451 - Progressing by Darreld Mcleanox, Sanvi Ehler, RN Will remain free from infection 04/28/2017 1451 - Progressing by Darreld Mcleanox, Devian Bartolomei, RN Diagnostic test results will improve 04/28/2017 1451 - Progressing by Darreld Mcleanox, Seward Coran, RN Respiratory complications will improve 04/28/2017 1451 - Progressing by Darreld Mcleanox, Irlene Crudup, RN Cardiovascular complication will be avoided 04/28/2017 1451 - Progressing by Darreld Mcleanox, Shikha Bibb, RN Activity: Risk for activity intolerance will decrease 04/28/2017 1451 - Progressing by Darreld Mcleanox, Tien Spooner, RN Nutrition: Adequate nutrition will be maintained 04/28/2017 1451 - Progressing by Darreld Mcleanox, Arvilla Salada, RN Coping: Level of anxiety will decrease 04/28/2017 1451 - Progressing by Darreld Mcleanox, Leesa Leifheit, RN Elimination: Will not experience complications related to bowel motility 04/28/2017 1451 - Progressing by Darreld Mcleanox, Thomas Rhude, RN Will not experience complications related to urinary retention 04/28/2017 1451 - Progressing by Darreld Mcleanox, Giulianna Rocha, RN Pain Managment: General experience of comfort will improve 04/28/2017 1451 - Progressing by Darreld Mcleanox, [place], RN Safety: Ability to remain free from injury will improve 04/28/2017 1451 - Progressing by Darreld Mcleanox, Shalyn Koral, RN Skin Integrity: Risk for impaired skin integrity will decrease 04/28/2017 1451 - Progressing by Darreld Mcleanox, Maureena Dabbs, RN

## 2017-04-28 NOTE — Progress Notes (Signed)
Late entry: Patient given nasal betadine in OR by Sharlene DoryLuz Walker, CRNA.

## 2017-04-28 NOTE — Anesthesia Preprocedure Evaluation (Addendum)
Anesthesia Evaluation  Patient identified by MRN, date of birth, ID band Patient awake    Reviewed: Allergy & Precautions, NPO status , Patient's Chart, lab work & pertinent test results  History of Anesthesia Complications Negative for: history of anesthetic complications  Airway Mallampati: II  TM Distance: >3 FB Neck ROM: Full    Dental  (+) Teeth Intact   Pulmonary neg pulmonary ROS,    breath sounds clear to auscultation       Cardiovascular hypertension, Pt. on medications (-) angina(-) Past MI and (-) CHF  Rhythm:Regular + Systolic murmurs    Neuro/Psych PSYCHIATRIC DISORDERS negative neurological ROS     GI/Hepatic negative GI ROS, (+)     substance abuse  alcohol use,   Endo/Other    Renal/GU negative Renal ROS     Musculoskeletal  (+) Arthritis , Right hip fx   Abdominal   Peds  Hematology  (+) anemia ,   Anesthesia Other Findings   Reproductive/Obstetrics                            Anesthesia Physical Anesthesia Plan  ASA: III  Anesthesia Plan: General   Post-op Pain Management:    Induction: Intravenous  PONV Risk Score and Plan:   Airway Management Planned: Oral ETT  Additional Equipment: None  Intra-op Plan:   Post-operative Plan: Extubation in OR  Informed Consent: I have reviewed the patients History and Physical, chart, labs and discussed the procedure including the risks, benefits and alternatives for the proposed anesthesia with the patient or authorized representative who has indicated his/her understanding and acceptance.   Dental advisory given  Plan Discussed with: Anesthesiologist and CRNA  Anesthesia Plan Comments:        Anesthesia Quick Evaluation

## 2017-04-28 NOTE — H&P (Signed)
History and Physical    DONALDSON RICHTER WIO:[id number] DOB: 13-Mar-1933 DOA: 04/28/2017   PCP: Abner Greenspan, MD   Patient coming from:  Home    Chief Complaint: Right hip pain   HPI: Elijah Nixon is a 84 y.o. male with medical history significant for HTN, alcohol abuse, arthritis, prior fall with fracture of his left wrist and ribs, history of Grover's disease, brought to the emergency department after getting out of his bed, and suffering a mechanical fall, landing on his right hip.  He denies any loss of consciousness, or hitting his head.  He denies any vision changes or headaches.  He denies any chest pain or palpitations.  He denies any significant shortness of breath, or productive cough.  At times, he does not take deep breath, due to residual pain from his recent rib fracture, but this is not an acute or worsening issue.  Pain, nausea or vomiting.  He denies any lower extremity swelling or calf pain.  Denies any numbness or tingling of his extremities.  The patient drinks chronically about 10 beers a day, last on 04/27/2017 at 10 PM.  He denies any tobacco or recreational drug use.   ED Course:  BP 125/64   Pulse 79   Temp 97.6 F (36.4 C) (Oral)   Resp 14   Ht _0  (1.727 m)   Wt 72.6 kg (160 lb)   SpO2 91%   BMI 24.33 kg/m   Right hip x-ray showed comminuted displaced right intertrochanteric hip fracture Chest x-ray all lung scarring no acute cardio pulmonary process appeared to the film in November 2018 Sodium 129, minimally changed since October 2018, appears chronic. Potassium normal at 3.5. Glucose 116 And 0.64. LFTs normal Hemoglobin 12.2, stable White count normal at 10.5 Platelets 311  Patient received fentanyl initially 150 mcg on arrival, then received Dilaudid 0.5 mg at 8:15 AM, accompanied by his Zofran for nausea. Orthopedic surgery was contacted, discussing the case, and agreed to proceed this afternoon with right hip repair.   Review of Systems:  As per HPI otherwise all other systems reviewed and are negative  Past Medical History:  Diagnosis Date  . AA (alcohol abuse)    at least 8-10 drinks/day  . Arthritis    OA  . DDD (degenerative disc disease)   . ED (erectile dysfunction)   . Grover's disease   . History of colon polyps   . History of diverticulitis of colon   . Hypertension   . Macular degeneration    dry  . Osteoporosis    spinal compression fx    Past Surgical History:  Procedure Laterality Date  . BACK SURGERY    . SPINE SURGERY  1971   deg disc disease x 2    Social History Social History   Socioeconomic History  . Marital status: Married    Spouse name: Not on file  . Number of children: Not on file  . Years of education: Not on file  . Highest education level: Not on file  Social Needs  . Financial resource strain: Not on file  . Food insecurity - worry: Not on file  . Food insecurity - inability: Not on file  . Transportation needs - medical: Not on file  . Transportation needs - non-medical: Not on file  Occupational History  . Not on file  Tobacco Use  . Smoking status: Never Smoker  . Smokeless tobacco: Never Used  Substance and Sexual Activity  .  Alcohol use: Yes    Alcohol/week: 6.0 oz    Types: 10 Cans of beer per week    Comment: 10 cans throughout the day   . Drug use: No  . Sexual activity: No  Other Topics Concern  . Not on file  Social History Narrative  . Not on file     Allergies  Allergen Reactions  . Lisinopril     REACTION: increased potassium leveles    Family History  Problem Relation Age of Onset  . Diabetes Son       Prior to Admission medications   Medication Sig Start Date End Date Taking? Authorizing Provider  amLODipine (NORVASC) 10 MG tablet TAKE 1 TABLET BY MOUTH DAILY 10/17/16  Yes Tower, Wynelle Fanny, MD  aspirin 81 MG tablet Take 81 mg by mouth daily.     Yes [provider]  b complex vitamins tablet Take 1 tablet by mouth daily.   Yes  [provider]  Calcium Carbonate-Vit D-Min 600-400 MG-UNIT TABS Take 1 tablet by mouth daily.    Yes [provider]  Cyanocobalamin (VITAMIN B-12 PO) Take 1 tablet by mouth daily.   Yes [provider]  doxazosin (CARDURA) 8 MG tablet TAKE ONE (1) TABLET BY MOUTH EVERY DAY 10/17/16  Yes Tower, Wynelle Fanny, MD  ibuprofen (ADVIL,MOTRIN) 200 MG tablet Take 600 mg by mouth 2 (two) times daily.   Yes [provider]  Multiple Vitamins-Minerals (PRESERVISION/LUTEIN) CAPS Take 1 capsule by mouth daily.     Yes [provider]  spironolactone (ALDACTONE) 25 MG tablet Take 0.5 tablets (12.5 mg total) by mouth daily. 10/17/16  Yes Tower, Wynelle Fanny, MD  hydrOXYzine (ATARAX/VISTARIL) 25 MG tablet Take 0.5-1 tablets (12.5-25 mg total) by mouth at bedtime as needed for itching. Patient not taking: Reported on 04/28/2017 08/30/16   Tower, Wynelle Fanny, MD  methocarbamol (ROBAXIN) 500 MG tablet Take 1 or 2 po Q 6hrs for muscle spasms or pain Patient not taking: Reported on 04/28/2017 02/15/17   Rolland Porter, MD  PERCOCET 5-325 MG tablet Take 1 tablet by mouth every 6 (six) hours as needed for severe pain. Patient not taking: Reported on 04/28/2017 02/15/17   Rolland Porter, MD    Physical Exam:  Vitals:   04/28/17 0737 04/28/17 0800 04/28/17 0830 04/28/17 0845  BP: (!) 151/83 (!) 142/81 133/70 125/64  Pulse: 85 87 82 79  Resp: _0 Temp: 97.6 F (36.4 C)     TempSrc: Oral     SpO2: 92% 98% 95% 91%  Weight:      Height:       Constitutional: NAD, calm, appears comfortable after initiation of pain control  eyes: PERRL, lids and conjunctivae normal ENMT: Mucous membranes are moist, without exudate or lesions  Neck: normal, supple, no masses, no thyromegaly Respiratory: clear to auscultation bilaterally, mild atelectatic sounds on the left, no wheezing, no crackles. Normal respiratory effort  Cardiovascular: Regular rate and rhythm,  murmur, no rubs or gallops. No extremity  edema. 2+ pedal pulses. No carotid bruits.  Abdomen: Soft, non tender, No hepatosplenomegaly. Bowel sounds positive.  Musculoskeletal: no clubbing / cyanosis.  Remarkable for right external rotation of the leg, mild swelling, without pitting edema. Skin: no jaundice, chronic generalized rash consistent with Grover's disease.Marland Kitchen  Neurologic: Sensation intact  Strength equal in all extremities, right lower extremity not tested due to fracture hip Psychiatric:   Alert and oriented x 3. Normal mood.  Labs on Admission: I have personally reviewed following labs and imaging studies  CBC: Recent Labs  Lab 04/28/17 0632  WBC 10.5  NEUTROABS 7.9*  HGB 12.2*  HCT 34.4*  MCV 91.7  PLT 831    Basic Metabolic Panel: Recent Labs  Lab 04/28/17 0632  NA 129*  K 3.5  CL 98*  CO2 22  GLUCOSE 116*  BUN 9  CREATININE 0.64  CALCIUM 8.7*    GFR: Estimated Creatinine Clearance: 68.9 mL/min (by C-G formula based on SCr of 0.64 mg/dL).  Liver Function Tests: Recent Labs  Lab 04/28/17 0632  AST 24  ALT 22  ALKPHOS 73  BILITOT 0.7  PROT 6.1*  ALBUMIN 3.2*   No results for input(s): LIPASE, AMYLASE in the last 168 hours. No results for input(s): AMMONIA in the last 168 hours.  Coagulation Profile: No results for input(s): INR, PROTIME in the last 168 hours.  Cardiac Enzymes: No results for input(s): CKTOTAL, CKMB, CKMBINDEX, TROPONINI in the last 168 hours.  BNP (last 3 results) No results for input(s): PROBNP in the last 8760 hours.  HbA1C: No results for input(s): HGBA1C in the last 72 hours.  CBG: No results for input(s): GLUCAP in the last 168 hours.  Lipid Profile: No results for input(s): CHOL, HDL, LDLCALC, TRIG, CHOLHDL, LDLDIRECT in the last 72 hours.  Thyroid Function Tests: No results for input(s): TSH, T4TOTAL, FREET4, T3FREE, THYROIDAB in the last 72 hours.  Anemia Panel: No results for input(s): VITAMINB12, FOLATE, FERRITIN, TIBC, IRON, RETICCTPCT in the  last 72 hours.  Urine analysis:    Component Value Date/Time   COLORURINE YELLOW 01/21/2017 1151   APPEARANCEUR CLEAR 01/21/2017 1151   LABSPEC 1.011 01/21/2017 1151   PHURINE 8.0 01/21/2017 1151   GLUCOSEU NEGATIVE 01/21/2017 1151   HGBUR NEGATIVE 01/21/2017 1151   BILIRUBINUR NEGATIVE 01/21/2017 1151   KETONESUR NEGATIVE 01/21/2017 1151   PROTEINUR NEGATIVE 01/21/2017 1151   NITRITE NEGATIVE 01/21/2017 1151   LEUKOCYTESUR NEGATIVE 01/21/2017 1151    Sepsis Labs: _0 (procalcitonin:4,lacticidven:4) )No results found for this or any previous visit (from the past 240 hour(s)).   Radiological Exams on Admission: Dg Hip Unilat  With Pelvis 2-3 Views Right  Result Date: 04/28/2017 CLINICAL DATA:  Pain after fall EXAM: DG HIP (WITH OR WITHOUT PELVIS) 2-3V RIGHT COMPARISON:  None. FINDINGS: There is a comminuted displaced and angulated fracture in the right hip through the intertrochanteric region. No dislocation identified. No other fractures are seen. IMPRESSION: Comminuted displaced and angulated right intertrochanteric hip fracture. Electronically Signed   By: Dorise Bullion III M.D   On: 04/28/2017 07:31    EKG: Independently reviewed.  Assessment/Plan Active Problems:   S/P right hip fracture   Alcohol abuse   Essential hypertension   GEN OSTEOARTHROSIS INVOLVING MULTIPLE SITES   COLONIC POLYPS, HX OF   Seborrheic keratosis   BPH (benign prostatic hyperplasia)   Anemia   Grover's disease   Hyponatremia  Sodium 129, minimally changed since October 2018, appears chronic. Potassium normal at 3.5. Glucose 116 And 0.64. LFTs normal Hemoglobin 12.2, stable White count normal at 10.5 Platelets 311    Right hip Fracture following a mechanical fall.  Right hip x-ray showed comminuted displaced right intertrochanteric hip fracture. Chest x-ray all lung scarring no acute cardio pulmonary process appeared to the film in November 2018.  CBC and chemistries unremarkable,  with exception of sodium 129, which is minimally changed since October 2018, appears chronic.Marland Kitchen Received  IV pain meds, immobilized.  Patient is afebrile, vital signs are stable. Admit to med surg, anticipating surgery as per Ortho today NPO for now  SCDs IVF Pain control with Dilaudid, and Norco, Hold ASA and any anticoagulation Bedrest PT/OT consult after surgery CBC and be met in the morning   Hypertension BP 151/83   Pulse 85   Continue home anti-hypertensive medications , except hold Aldactone due to low sodium levels, may resume in am pending on his BMET    Hyponatremia  likely due to diuretics  No neuro deficits Hold Aldactone as above IV Normal saline should improve values  Consider SIADH w/u if values do not improve    Alcohol abuse and dependence and at risk for withdrawal   CIWA with Ativan per protocol  Thiamine, folate, and MVI  Benign prostatic hypertrophy, no acute issues Continue Cardura   DVT prophylaxis:  SCD Code Status:    DNR  Family Communication:  Discussed with patient and wife  Disposition Plan: Expect patient to be discharged to home after condition improves Consults called:    Orthopedics, Dr. Percell Miller  Admission status:  Medsurg IP    Sharene Butters, PA-C [facility]   Amion text  [phone]   04/28/2017, 9:21 AM

## 2017-04-29 LAB — BASIC METABOLIC PANEL
Anion gap: 6 (ref 5–15)
BUN: 8 mg/dL (ref 6–20)
CO2: 22 mmol/L (ref 22–32)
Calcium: 8 mg/dL — ABNORMAL LOW (ref 8.9–10.3)
Chloride: 103 mmol/L (ref 101–111)
Creatinine, Ser: 0.87 mg/dL (ref 0.61–1.24)
GFR calc Af Amer: >60 mL/min (ref >60)
GFR calc non Af Amer: >60 mL/min (ref >60)
GLUCOSE: 121 mg/dL — AB (ref 65–99)
POTASSIUM: 3.4 mmol/L — AB (ref 3.5–5.1)
Sodium: 131 mmol/L — ABNORMAL LOW (ref 135–145)

## 2017-04-29 LAB — CBC
HCT: 23.4 % — ABNORMAL LOW (ref 39.0–52.0)
Hemoglobin: 8.5 g/dL — ABNORMAL LOW (ref 13.0–17.0)
MCH: 35.4 pg — AB (ref 26.0–34.0)
MCHC: 36.3 g/dL — ABNORMAL HIGH (ref 30.0–36.0)
MCV: 97.5 fL (ref 78.0–100.0)
Platelets: 284 10*3/uL (ref 150–400)
RBC: 2.4 MIL/uL — AB (ref 4.22–5.81)
RDW: 13.2 % (ref 11.5–15.5)
WBC: 9.2 10*3/uL (ref 4.0–10.5)

## 2017-04-29 MED ORDER — POTASSIUM CHLORIDE CRYS ER 20 MEQ PO TBCR
40.0000 meq | EXTENDED_RELEASE_TABLET | Freq: Once | ORAL | Status: AC
  Administered 2017-04-29: 40 meq via ORAL
  Filled 2017-04-29: qty 2

## 2017-04-29 NOTE — Progress Notes (Signed)
Subjective: 1 Day Post-Op Procedure(s) (LRB): INTRAMEDULLARY (IM) NAIL INTERTROCHANTRIC (Right) Patient reports pain as mild.    Objective: Vital signs in last 24 hours: Temp:  [97.2 F (36.2 C)-98.7 F (37.1 C)] 98.2 F (36.8 C) (01/13 0413) Pulse Rate:  [75-94] 87 (01/13 0413) Resp:  [14-20] 18 (01/12 1651) BP: (114-154)/(61-91) 114/77 (01/13 0413) SpO2:  [93 %-100 %] 100 % (01/13 0413) FiO2 (%):  [32 %] 32 % (01/12 1332)  Intake/Output from previous day: 01/12 0701 - 01/13 0700 In: 1150 [I.V.:900; IV Piggyback:250] Out: 250 [Blood:250] Intake/Output this shift: No intake/output data recorded.  Recent Labs    04/28/17 0632 04/28/17 1330 04/29/17 0705  HGB 12.2* 10.3* 8.5*   Recent Labs    04/28/17 1330 04/29/17 0705  WBC 19.3* 9.2  RBC 3.17* 2.40*  HCT 29.4* 23.4*  PLT 278 284   Recent Labs    04/28/17 0632 04/28/17 1330 04/29/17 0705  NA 129*  --  131*  K 3.5  --  3.4*  CL 98*  --  103  CO2 22  --  22  BUN 9  --  8  CREATININE 0.64 0.79 0.87  GLUCOSE 116*  --  121*  CALCIUM 8.7*  --  8.0*   Recent Labs    04/28/17 0906  INR 0.92    Neurovascular intact Sensation intact distally Intact pulses distally Dorsiflexion/Plantar flexion intact Incision: dressing C/D/I No cellulitis present Compartment soft  Assessment/Plan: 1 Day Post-Op Procedure(s) (LRB): INTRAMEDULLARY (IM) NAIL INTERTROCHANTRIC (Right) Up with therapy  WBAT RLE with walker Pain control as ordered lovenox dvt proph PT/OT eval today to assess d/c needs home vs SNF  fyi for PT pt and family member note that has know spinal stenosis which they feel causes him to have a shuffling gait pattern likely which caused fall on carpet at home.   Ivin BootyJoshua Abhishek Nixon 04/29/2017, 10:19 AM

## 2017-04-29 NOTE — Progress Notes (Signed)
Physical Therapy Treatment Patient Details Name: Elijah Nixon MRN: [id number] DOB: [birth year] Today's Date: 04/29/2017    History of Present Illness Elijah Nixon is a 84 y.o. male with medical history significant for HTN, alcohol abuse, arthritis, prior fall with fracture of his left wrist and ribs, history of Grover's disease, brought to the emergency department after getting out of his bed, and suffering a mechanical fall, landing on his right hip. R hip intertrochanteric fracture, now s/p IM Nail, WBAT    PT Comments    Continuing work on functional mobility and activity tolerance;  Noted nursing was having difficulty helping Elijah Nixon back to bed, so assisted; Pain and decr activity tolerance effecting ability to perform transfers and to stand; Heavy mod assist of 2 to rise from recliner and transfer back to bed   Follow Up Recommendations  SNF     Equipment Recommendations  Rolling walker with 5" wheels;3in1 (PT)    Recommendations for Other Services       Precautions / Restrictions Precautions Precautions: Fall Restrictions RUE Weight Bearing: Weight bearing as tolerated    Mobility  Bed Mobility Overal bed mobility: Needs Assistance Bed Mobility: Sit to Supine     Supine to sit: Max assist;+2 for physical assistance;HOB elevated Sit to supine: +2 for physical assistance;Max assist   General bed mobility comments: Cues for techqniue and +2 Max assist to help shoulders down while helping LEs into bed  Transfers Overall transfer level: Needs assistance Equipment used: Rolling walker (2 wheeled) Transfers: Sit to/from UGI CorporationStand;Stand Pivot Transfers Sit to Stand: Mod assist;+2 physical assistance Stand pivot transfers: Mod assist;+2 physical assistance       General transfer comment: Heavy mod assist +2 to boost up from recliner; pt pulling up on RW with bil UEs and blocking R foot from scooting out. Mod assist +2 for stand pivot to L side with max cues for  sequencing and safety.  Ambulation/Gait Ambulation/Gait assistance: Mod assist;+2 physical assistance;+2 safety/equipment Ambulation Distance (Feet): (pivotal steps bed to chair) Assistive device: Rolling walker (2 wheeled) Gait Pattern/deviations: Decreased stance time - right     General Gait Details: Step-by-step cues for sequence and technique; Good use of RW to unweigh painful RLE in stance; quite distracted by pain   Stairs            Wheelchair Mobility    Modified Rankin (Stroke Patients Only)       Balance     Sitting balance-Leahy Scale: Poor     Standing balance support: Bilateral upper extremity supported Standing balance-Leahy Scale: Poor                              Cognition Arousal/Alertness: Awake/alert Behavior During Therapy: WFL for tasks assessed/performed Overall Cognitive Status: Within Functional Limits for tasks assessed                                        Exercises      General Comments        Pertinent Vitals/Pain Pain Assessment: Faces Faces Pain Scale: Hurts whole lot Pain Location: R hip Pain Descriptors / Indicators: Grimacing;Guarding Pain Intervention(s): Monitored during session;Repositioned    Home Living Family/patient expects to be discharged to:: Private residence Living Arrangements: Spouse/significant other Available Help at Discharge: Family;Available 24 hours/day Type of Home: House(townhome) Home Access: Stairs  to enter   Home Layout: One level Home Equipment: Dan Humphreys - 2 wheels;Shower seat - built in;Hand held shower head      Prior Function Level of Independence: Independent          PT Goals (current goals can now be found in the care plan section) Acute Rehab PT Goals Patient Stated Goal: decrease pain PT Goal Formulation: With patient Time For Goal Achievement: 05/13/17 Potential to Achieve Goals: Good Progress towards PT goals: Progressing toward goals     Frequency    Min 2X/week      PT Plan Current plan remains appropriate    Co-evaluation PT/OT/SLP Co-Evaluation/Treatment: Yes Reason for Co-Treatment: For patient/therapist safety PT goals addressed during session: Mobility/safety with mobility        AM-PAC PT "6 Clicks" Daily Activity  Outcome Measure  Difficulty turning over in bed (including adjusting bedclothes, sheets and blankets)?: Unable Difficulty moving from lying on back to sitting on the side of the bed? : Unable Difficulty sitting down on and standing up from a chair with arms (e.g., wheelchair, bedside commode, etc,.)?: Unable Help needed moving to and from a bed to chair (including a wheelchair)?: A Lot Help needed walking in hospital room?: A Lot Help needed climbing 3-5 steps with a railing? : Total 6 Click Score: 8    End of Session Equipment Utilized During Treatment: Gait belt Activity Tolerance: Patient limited by pain Patient left: in bed;with call bell/phone within reach;with nursing/sitter in room Nurse Communication: Mobility status PT Visit Diagnosis: Other abnormalities of gait and mobility (R26.89);Pain Pain - Right/Left: Right Pain - part of body: Hip     Time: 6962-9528 PT Time Calculation (min) (ACUTE ONLY): 19 min  Charges:  $Therapeutic Activity: 8-22 mins                    G Codes:       Elijah Nixon, PT  Acute Rehabilitation Services Pager [phone] Office [phone]    Elijah Nixon 04/29/2017, 5:08 PM

## 2017-04-29 NOTE — Evaluation (Signed)
Occupational Therapy Evaluation Patient Details Name: Elijah Nixon MRN: [id number] DOB: 1932-12-07 Today's Date: 04/29/2017    History of Present Illness Elijah Nixon is a 84 y.o. male with medical history significant for HTN, alcohol abuse, arthritis, prior fall with fracture of his left wrist and ribs, history of Grover's disease, brought to the emergency department after getting out of his bed, and suffering a mechanical fall, landing on his right hip. R hip intertrochanteric fracture, now s/p IM Nail, WBAT   Clinical Impression   Pt reports he was independent with ADL PTA. Currently pt requires mod assist +2 for stand pivot transfer and mod-max assist for ADL. Recommending SNF for follow up to maximize independence and safety with ADL and functional mobility prior to return home. Pt would benefit from continued skilled OT to address established goals.    Follow Up Recommendations  SNF;Supervision/Assistance - 24 hour    Equipment Recommendations  Other (comment)(TBD at next venue)    Recommendations for Other Services       Precautions / Restrictions Precautions Precautions: Fall Restrictions Weight Bearing Restrictions: Yes RUE Weight Bearing: Weight bearing as tolerated      Mobility Bed Mobility Overal bed mobility: Needs Assistance Bed Mobility: Supine to Sit     Supine to sit: Max assist;+2 for physical assistance;HOB elevated     General bed mobility comments: Assist for LEs to EOB and trunk elevation to sitting. Use of bed pad to scoot hips out to EOB and HOB elevated throughout  Transfers Overall transfer level: Needs assistance Equipment used: Rolling walker (2 wheeled) Transfers: Sit to/from [employer] Sit to Stand: Mod assist;+2 physical assistance Stand pivot transfers: Mod assist;+2 physical assistance       General transfer comment: Heavy mod assist +2 to boost up from EOB; pt pulling up on RW with bil UEs and blocking R foot  from scooting out. Mod assist +2 for stand pivot to L side with max cues for sequencing and safety.    Balance Overall balance assessment: Needs assistance Sitting-balance support: Feet supported;Bilateral upper extremity supported Sitting balance-Leahy Scale: Poor     Standing balance support: Bilateral upper extremity supported Standing balance-Leahy Scale: Poor                             ADL either performed or assessed with clinical judgement   ADL Overall ADL's : Needs assistance/impaired Eating/Feeding: Set up;Sitting   Grooming: Minimal assistance;Sitting   Upper Body Bathing: Moderate assistance;Sitting   Lower Body Bathing: Maximal assistance;+2 for physical assistance;Sit to/from stand   Upper Body Dressing : Moderate assistance;Sitting   Lower Body Dressing: Maximal assistance;+2 for physical assistance;Sit to/from stand   Toilet Transfer: Moderate assistance;+2 for physical assistance;Stand-pivot;BSC;RW Toilet Transfer Details (indicate cue type and reason): Simulated by transfer EOB to chair         Functional mobility during ADLs: Moderate assistance;+2 for physical assistance;Rolling walker(for stand pivot only)       Vision         Perception     Praxis      Pertinent Vitals/Pain Pain Assessment: Faces Faces Pain Scale: Hurts whole lot Pain Location: R hip Pain Descriptors / Indicators: Grimacing;Guarding Pain Intervention(s): Monitored during session;Repositioned;Limited activity within patient's tolerance     Hand Dominance     Extremity/Trunk Assessment Upper Extremity Assessment Upper Extremity Assessment: Generalized weakness   Lower Extremity Assessment Lower Extremity Assessment: Defer to PT evaluation  Cervical / Trunk Assessment Cervical / Trunk Assessment: Kyphotic   Communication Communication Communication: No difficulties   Cognition Arousal/Alertness: Awake/alert Behavior During Therapy: WFL for tasks  assessed/performed Overall Cognitive Status: Within Functional Limits for tasks assessed                                     General Comments       Exercises     Shoulder Instructions      Home Living Family/patient expects to be discharged to:: Private residence Living Arrangements: Spouse/significant other Available Help at Discharge: Family;Available 24 hours/day Type of Home: House(townhome) Home Access: Stairs to enter Entrance Stairs-Number of Steps: 1   Home Layout: One level     Bathroom Shower/Tub: Walk-in shower;Door   Foot LockerBathroom Toilet: Standard     Home Equipment: Environmental consultantWalker - 2 wheels;Shower seat - built in;Hand held shower head          Prior Functioning/Environment Level of Independence: Independent                 OT Problem List: Decreased strength;Decreased range of motion;Decreased activity tolerance;Impaired balance (sitting and/or standing);Decreased knowledge of use of DME or AE;Decreased knowledge of precautions;Pain;Increased edema      OT Treatment/Interventions: Self-care/ADL training;Therapeutic exercise;Energy conservation;DME and/or AE instruction;Therapeutic activities;Patient/family education;Balance training    OT Goals(Current goals can be found in the care plan section) Acute Rehab OT Goals Patient Stated Goal: decrease pain OT Goal Formulation: With patient/family Time For Goal Achievement: 05/13/17 Potential to Achieve Goals: Good ADL Goals Pt Will Perform Lower Body Bathing: with min assist;sit to/from stand;with adaptive equipment Pt Will Perform Lower Body Dressing: with min assist;sit to/from stand;with adaptive equipment Pt Will Transfer to Toilet: with min assist;stand pivot transfer;bedside commode Pt Will Perform Toileting - Clothing Manipulation and hygiene: with min assist;sit to/from stand  OT Frequency: Min 2X/week   Barriers to D/C:            Co-evaluation PT/OT/SLP Co-Evaluation/Treatment:  Yes Reason for Co-Treatment: For patient/therapist safety;To address functional/ADL transfers   OT goals addressed during session: ADL's and self-care      AM-PAC PT "6 Clicks" Daily Activity     Outcome Measure Help from another person eating meals?: A Little Help from another person taking care of personal grooming?: A Little Help from another person toileting, which includes using toliet, bedpan, or urinal?: A Lot Help from another person bathing (including washing, rinsing, drying)?: A Lot Help from another person to put on and taking off regular upper body clothing?: A Lot Help from another person to put on and taking off regular lower body clothing?: A Lot 6 Click Score: 14   End of Session Equipment Utilized During Treatment: Gait belt;Rolling walker Nurse Communication: Mobility status;Weight bearing status  Activity Tolerance: Patient tolerated treatment well Patient left: in chair;with call bell/phone within reach;with chair alarm set;with family/visitor present;with SCD's reapplied  OT Visit Diagnosis: Other abnormalities of gait and mobility (R26.89);Unsteadiness on feet (R26.81);Pain Pain - Right/Left: Right Pain - part of body: Hip                Time: [id number] OT Time Calculation (min): 24 min Charges:  OT General Charges $OT Visit: 1 Visit OT Evaluation $OT Eval Moderate Complexity: 1 Mod G-Codes:     Deshan Hemmelgarn A. Brett Albinooffey, M.S., OTR/L Pager: [phone]  Gaye AlkenBailey A Brodie Correll 04/29/2017, 2:11 PM

## 2017-04-29 NOTE — Progress Notes (Signed)
PROGRESS NOTE    Elijah Nixon  UEA:[id number] DOB: 12-23-1932 DOA: 04/28/2017 PCP: Judy Pimple, MD     Brief Narrative:  Elijah Nixon is a 84 y.o. male with medical history significant for HTN, alcohol abuse, arthritis, prior fall with fracture of his left wrist and ribs, history of Grover's disease, brought to the emergency department after getting out of his bed, and suffering a mechanical fall, landing on his right hip.  He denies any loss of consciousness, or hitting his head.  He denies any vision changes or headaches.  He denies any chest pain or palpitations.  He denies any significant shortness of breath, or productive cough.  At times, he does not take deep breath, due to residual pain from his recent rib fracture, but this is not an acute or worsening issue. The patient drinks chronically about 10 beers a day, last on 04/27/2017 at 10 PM.  He denies any tobacco or recreational drug use. Right hip x-ray showed comminuted displaced right intertrochanteric hip fracture. He underwent fixation 1/12 with Dr. Everardo Pacific.   Assessment & Plan:   Active Problems:   Alcohol abuse   Essential hypertension   GEN OSTEOARTHROSIS INVOLVING MULTIPLE SITES   COLONIC POLYPS, HX OF   Seborrheic keratosis   BPH (benign prostatic hyperplasia)   Anemia   Grover's disease   S/P right hip fracture   Hyponatremia   Right hip fracture following a mechanical fall -Underwent fixation 1/12 with Dr. Everardo Pacific -PT OT eval -WBAT -Lovenox for 2 weeks  -Follow up with Dr. Everardo Pacific 10-14 days   Hypertension  -Continue norvasc   Hyponatremia -Improving with IVF   Hypokalemia -Replace, trend   Alcohol abuse  -CIWA with Ativan per protocol -Thiamine, folate, and MVI  Benign prostatic hypertrophy -Continue Cardura   DVT prophylaxis: Lovenox  Code Status: DNR Family Communication: At bedside Disposition Plan: Pending PT OT eval   Consultants:   Orthopedic surgery   Procedures:      Antimicrobials:  Anti-infectives (From admission, onward)   Start     Dose/Rate Route Frequency Ordered Stop   04/28/17 1600  ceFAZolin (ANCEF) IVPB 2g/100 mL premix     2 g 200 mL/hr over 30 Minutes Intravenous Every 6 hours 04/28/17 1332 04/28/17 2316   04/28/17 0931  ceFAZolin (ANCEF) 2-4 GM/100ML-% IVPB    Comments:  Shireen Quan   : cabinet override      04/28/17 0931 04/28/17 1004   04/28/17 0930  ceFAZolin (ANCEF) IVPB 2g/100 mL premix     2 g 200 mL/hr over 30 Minutes Intravenous  Once 04/28/17 0929 04/28/17 1014   04/28/17 0915  ceFAZolin (ANCEF) powder 2 g  Status:  Discontinued     2 g Other To Surgery 04/28/17 0910 04/28/17 0929        Subjective: No complaints this morning, feeling well overall.  His pain is well controlled.  Objective: Vitals:   04/28/17 1651 04/28/17 1929 04/29/17 0038 04/29/17 0413  BP: 137/70 124/79 132/62 114/77  Pulse: 94 83 84 87  Resp: 18     Temp: 98.1 F (36.7 C) 98.5 F (36.9 C) 98.7 F (37.1 C) 98.2 F (36.8 C)  TempSrc: Oral Oral Oral Oral  SpO2: 99% 100% 98% 100%  Weight:      Height:        Intake/Output Summary (Last 24 hours) at 04/29/2017 1243 Last data filed at 04/28/2017 1246 Gross per 24 hour  Intake 200 ml  Output -  Net 200 ml   Filed Weights   04/28/17 0602  Weight: 72.6 kg (160 lb)    Examination:  General exam: Appears calm and comfortable  Respiratory system: Clear to auscultation. Respiratory effort normal. Cardiovascular system: S1 & S2 heard, RRR. No JVD, murmurs, rubs, gallops or clicks. No pedal edema. Gastrointestinal system: Abdomen is nondistended, soft and nontender. No organomegaly or masses felt. Normal bowel sounds heard. Central nervous system: Alert and oriented. No focal neurological deficits. Extremities: Symmetric Skin: No rashes, lesions or ulcers Psychiatry: Judgement and insight appear normal. Mood & affect appropriate.   Data Reviewed: I have personally reviewed following  labs and imaging studies  CBC: Recent Labs  Lab 04/28/17 0632 04/28/17 1330 04/29/17 0705  WBC 10.5 19.3* 9.2  NEUTROABS 7.9*  --   --   HGB 12.2* 10.3* 8.5*  HCT 34.4* 29.4* 23.4*  MCV 91.7 92.7 97.5  PLT 311 278 284   Basic Metabolic Panel: Recent Labs  Lab 04/28/17 0632 04/28/17 1330 04/29/17 0705  NA 129*  --  131*  K 3.5  --  3.4*  CL 98*  --  103  CO2 22  --  22  GLUCOSE 116*  --  121*  BUN 9  --  8  CREATININE 0.64 0.79 0.87  CALCIUM 8.7*  --  8.0*   GFR: Estimated Creatinine Clearance: 63.3 mL/min (by C-G formula based on SCr of 0.87 mg/dL). Liver Function Tests: Recent Labs  Lab 04/28/17 0632  AST 24  ALT 22  ALKPHOS 73  BILITOT 0.7  PROT 6.1*  ALBUMIN 3.2*   No results for input(s): LIPASE, AMYLASE in the last 168 hours. No results for input(s): AMMONIA in the last 168 hours. Coagulation Profile: Recent Labs  Lab 04/28/17 0906  INR 0.92   Cardiac Enzymes: No results for input(s): CKTOTAL, CKMB, CKMBINDEX, TROPONINI in the last 168 hours. BNP (last 3 results) No results for input(s): PROBNP in the last 8760 hours. HbA1C: No results for input(s): HGBA1C in the last 72 hours. CBG: No results for input(s): GLUCAP in the last 168 hours. Lipid Profile: No results for input(s): CHOL, HDL, LDLCALC, TRIG, CHOLHDL, LDLDIRECT in the last 72 hours. Thyroid Function Tests: No results for input(s): TSH, T4TOTAL, FREET4, T3FREE, THYROIDAB in the last 72 hours. Anemia Panel: No results for input(s): VITAMINB12, FOLATE, FERRITIN, TIBC, IRON, RETICCTPCT in the last 72 hours. Sepsis Labs: No results for input(s): PROCALCITON, LATICACIDVEN in the last 168 hours.  No results found for this or any previous visit (from the past 240 hour(s)).     Radiology Studies: Dg Chest 1 View  Result Date: 04/28/2017 CLINICAL DATA:  Hip fracture. EXAM: CHEST 1 VIEW COMPARISON:  February 15, 2017 FINDINGS: The heart, hila, and mediastinum are unremarkable. No  pneumothorax. No nodules or masses. No focal infiltrates. IMPRESSION: No active disease. Electronically Signed   By: Gerome Samavid  Williams III M.D   On: 04/28/2017 10:03   Dg C-arm 1-60 Min  Result Date: 04/28/2017 CLINICAL DATA:  Operative fixation of a right femoral intertrochanteric fracture. EXAM: RIGHT FEMUR 2 VIEWS; DG C-ARM 61-120 MIN COMPARISON:  Earlier today. FINDINGS: Eight C-arm views of the right femur demonstrate operative placement of and intramedullary rod and fixation screws bridging the previously demonstrated comminuted right femoral intertrochanteric fracture. The major fragments are in anatomic position and alignment on these views. IMPRESSION: Hardware fixation of the previously demonstrated right intertrochanteric fracture. Electronically Signed   By: Beckie SaltsSteven  Reid M.D.   On: 04/28/2017 12:08  Dg Hip Port Unilat With Pelvis 1v Right  Result Date: 04/28/2017 CLINICAL DATA:  Operative fixation of a comminuted right intertrochanteric fracture. EXAM: DG HIP (WITH OR WITHOUT PELVIS) 1V PORT RIGHT COMPARISON:  Earlier today. FINDINGS: Interval screw and rod fixation of the previously demonstrated comminuted intertrochanteric fracture of the right femur. Significantly improved position and alignment with essentially anatomic position and alignment of the major fragments. Lower lumbar spine degenerative changes. IMPRESSION: Operative fixation of the previously demonstrated right femoral intertrochanteric fracture. Electronically Signed   By: Beckie Salts M.D.   On: 04/28/2017 12:06   Dg Hip Unilat  With Pelvis 2-3 Views Right  Result Date: 04/28/2017 CLINICAL DATA:  Pain after fall EXAM: DG HIP (WITH OR WITHOUT PELVIS) 2-3V RIGHT COMPARISON:  None. FINDINGS: There is a comminuted displaced and angulated fracture in the right hip through the intertrochanteric region. No dislocation identified. No other fractures are seen. IMPRESSION: Comminuted displaced and angulated right intertrochanteric hip  fracture. Electronically Signed   By: Gerome Sam III M.D   On: 04/28/2017 07:31   Dg Femur, Min 2 Views Right  Result Date: 04/28/2017 CLINICAL DATA:  Operative fixation of a right femoral intertrochanteric fracture. EXAM: RIGHT FEMUR 2 VIEWS; DG C-ARM 61-120 MIN COMPARISON:  Earlier today. FINDINGS: Eight C-arm views of the right femur demonstrate operative placement of and intramedullary rod and fixation screws bridging the previously demonstrated comminuted right femoral intertrochanteric fracture. The major fragments are in anatomic position and alignment on these views. IMPRESSION: Hardware fixation of the previously demonstrated right intertrochanteric fracture. Electronically Signed   By: Beckie Salts M.D.   On: 04/28/2017 12:08      Scheduled Meds: . amLODipine  10 mg Oral Daily  . doxazosin  8 mg Oral Daily  . enoxaparin (LOVENOX) injection  40 mg Subcutaneous Q24H  . folic acid  1 mg Oral Daily  . multivitamin with minerals  1 tablet Oral Daily  . thiamine  100 mg Oral Daily   Or  . thiamine  100 mg Intravenous Daily   Continuous Infusions: . sodium chloride 100 mL/hr at 04/29/17 0548     LOS: 1 day    Time spent: 30 minutes   Noralee Stain, DO [facility] [email or web address] Password [facility] 04/29/2017, 12:43 PM

## 2017-04-29 NOTE — Evaluation (Signed)
Physical Therapy Evaluation Patient Details Name: Elijah Nixon MRN: [id number] DOB: [birth year] Today's Date: 04/29/2017   History of Present Illness  Elijah Nixon is a 84 y.o. male with medical history significant for HTN, alcohol abuse, arthritis, prior fall with fracture of his left wrist and ribs, history of Grover's disease, brought to the emergency department after getting out of his bed, and suffering a mechanical fall, landing on his right hip. R hip intertrochanteric fracture, now s/p IM Nail, WBAT  Clinical Impression   Patient is s/p above surgery resulting in functional limitations due to the deficits listed below (see PT Problem List). Pt reports he was independent with mobility and ADL PTA. Currently pt requires mod assist +2 for stand pivot transfer and mod-max assist for mobility and ADL. Recommending SNF for follow up to maximize independence and safety with mobility and  ADL and functional mobility prior to return home.  Patient will benefit from skilled PT to increase their independence and safety with mobility to allow discharge to the venue listed below.       Follow Up Recommendations SNF    Equipment Recommendations  Rolling walker with 5" wheels;3in1 (PT)    Recommendations for Other Services       Precautions / Restrictions Precautions Precautions: Fall Restrictions RUE Weight Bearing: Weight bearing as tolerated      Mobility  Bed Mobility Overal bed mobility: Needs Assistance Bed Mobility: Supine to Sit     Supine to sit: Max assist;+2 for physical assistance;HOB elevated     General bed mobility comments: Assist for LEs to EOB and trunk elevation to sitting. Use of bed pad to scoot hips out to EOB and HOB elevated throughout  Transfers Overall transfer level: Needs assistance Equipment used: Rolling walker (2 wheeled) Transfers: Sit to/from [employer] Sit to Stand: Mod assist;+2 physical assistance Stand pivot transfers:  Mod assist;+2 physical assistance       General transfer comment: Heavy mod assist +2 to boost up from EOB; pt pulling up on RW with bil UEs and blocking R foot from scooting out. Mod assist +2 for stand pivot to L side with max cues for sequencing and safety.  Ambulation/Gait Ambulation/Gait assistance: Mod assist;+2 physical assistance;+2 safety/equipment Ambulation Distance (Feet): (pivotal steps bed to chair) Assistive device: Rolling walker (2 wheeled) Gait Pattern/deviations: Decreased stance time - right     General Gait Details: Step-by-step cues for sequence and technique; Good use of RW to unweigh painful RLE in stance; quite distracted by pain  Stairs            Wheelchair Mobility    Modified Rankin (Stroke Patients Only)       Balance     Sitting balance-Leahy Scale: Poor       Standing balance-Leahy Scale: Poor                               Pertinent Vitals/Pain Pain Assessment: Faces Faces Pain Scale: Hurts whole lot Pain Location: R hip Pain Descriptors / Indicators: Grimacing;Guarding Pain Intervention(s): Monitored during session    Home Living Family/patient expects to be discharged to:: Private residence Living Arrangements: Spouse/significant other Available Help at Discharge: Family;Available 24 hours/day Type of Home: House(townhome) Home Access: Stairs to enter   Entrance Stairs-Number of Steps: 1 Home Layout: One level Home Equipment: Walker - 2 wheels;Shower seat - built in;Hand held shower head      Prior Function  Level of Independence: Independent               Hand Dominance        Extremity/Trunk Assessment   Upper Extremity Assessment Upper Extremity Assessment: Defer to OT evaluation    Lower Extremity Assessment Lower Extremity Assessment: RLE deficits/detail RLE Deficits / Details: Significant pain and muscle guarding limiting all movement about hip joint RLE: Unable to fully assess due to  pain    Cervical / Trunk Assessment Cervical / Trunk Assessment: Kyphotic  Communication   Communication: No difficulties  Cognition Arousal/Alertness: Awake/alert Behavior During Therapy: WFL for tasks assessed/performed Overall Cognitive Status: Within Functional Limits for tasks assessed                                        General Comments      Exercises     Assessment/Plan    PT Assessment Patient needs continued PT services  PT Problem List Decreased strength;Decreased range of motion;Decreased activity tolerance;Decreased balance;Decreased mobility;Decreased coordination;Decreased cognition;Decreased knowledge of use of DME;Decreased safety awareness;Decreased knowledge of precautions;Pain       PT Treatment Interventions DME instruction;Gait training;Functional mobility training;Therapeutic activities;Therapeutic exercise;Balance training;Neuromuscular re-education;Cognitive remediation;Patient/family education    PT Goals (Current goals can be found in the Care Plan section)  Acute Rehab PT Goals Patient Stated Goal: decrease pain PT Goal Formulation: With patient Time For Goal Achievement: 05/13/17 Potential to Achieve Goals: Good    Frequency Min 2X/week   Barriers to discharge        Co-evaluation PT/OT/SLP Co-Evaluation/Treatment: Yes Reason for Co-Treatment: For patient/therapist safety PT goals addressed during session: Mobility/safety with mobility         AM-PAC PT "6 Clicks" Daily Activity  Outcome Measure Difficulty turning over in bed (including adjusting bedclothes, sheets and blankets)?: Unable Difficulty moving from lying on back to sitting on the side of the bed? : Unable Difficulty sitting down on and standing up from a chair with arms (e.g., wheelchair, bedside commode, etc,.)?: Unable Help needed moving to and from a bed to chair (including a wheelchair)?: A Lot Help needed walking in hospital room?: A Lot Help  needed climbing 3-5 steps with a railing? : Total 6 Click Score: 8    End of Session Equipment Utilized During Treatment: Gait belt Activity Tolerance: Patient limited by pain Patient left: in chair;with call bell/phone within reach;with chair alarm set Nurse Communication: Mobility status PT Visit Diagnosis: Other abnormalities of gait and mobility (R26.89);Pain Pain - Right/Left: Right Pain - part of body: Hip    Time: 1610-96041150-1216 PT Time Calculation (min) (ACUTE ONLY): 26 min   Charges:   PT Evaluation $PT Eval Moderate Complexity: 1 Mod     PT G Codes:        Van ClinesHolly Gaudencio Chesnut, PT  Acute Rehabilitation Services Pager [phone] Office [phone]   Levi AlandHolly H Kolt Mcwhirter 04/29/2017, 3:33 PM

## 2017-04-29 NOTE — Plan of Care (Signed)
  Progressing Education: Knowledge of General Education information will improve 04/29/2017 0947 - Progressing by Darreld Mcleanox, Burma Ketcher, RN Health Behavior/Discharge Planning: Ability to manage health-related needs will improve 04/29/2017 0947 - Progressing by Darreld Mcleanox, Keyara Ent, RN Clinical Measurements: Ability to maintain clinical measurements within normal limits will improve 04/29/2017 0947 - Progressing by Darreld Mcleanox, Shatina Streets, RN Will remain free from infection 04/29/2017 0947 - Progressing by Darreld Mcleanox, Nadav Swindell, RN Diagnostic test results will improve 04/29/2017 0947 - Progressing by Darreld Mcleanox, Martel Galvan, RN Respiratory complications will improve 04/29/2017 0947 - Progressing by Darreld Mcleanox, Quincy Boy, RN Cardiovascular complication will be avoided 04/29/2017 0947 - Progressing by Darreld Mcleanox, Khyler Urda, RN Activity: Risk for activity intolerance will decrease 04/29/2017 0947 - Progressing by Darreld Mcleanox, Gwenn Teodoro, RN Nutrition: Adequate nutrition will be maintained 04/29/2017 0947 - Progressing by Darreld Mcleanox, Alvaretta Eisenberger, RN Coping: Level of anxiety will decrease 04/29/2017 0947 - Progressing by Darreld Mcleanox, Mannat Benedetti, RN Elimination: Will not experience complications related to bowel motility 04/29/2017 0947 - Progressing by Darreld Mcleanox, Shabazz Mckey, RN Will not experience complications related to urinary retention 04/29/2017 0947 - Progressing by Darreld Mcleanox, Jaquise Faux, RN Pain Managment: General experience of comfort will improve 04/29/2017 0947 - Progressing by Darreld Mcleanox, Aarian Cleaver, RN Safety: Ability to remain free from injury will improve 04/29/2017 0947 - Progressing by Darreld Mcleanox, Izaan Kingbird, RN Skin Integrity: Risk for impaired skin integrity will decrease 04/29/2017 0947 - Progressing by Darreld Mcleanox, Edye Hainline, RN

## 2017-04-30 LAB — BASIC METABOLIC PANEL
ANION GAP: 7 (ref 5–15)
BUN: 8 mg/dL (ref 6–20)
CHLORIDE: 106 mmol/L (ref 101–111)
CO2: 22 mmol/L (ref 22–32)
Calcium: 7.9 mg/dL — ABNORMAL LOW (ref 8.9–10.3)
Creatinine, Ser: 0.72 mg/dL (ref 0.61–1.24)
GFR calc Af Amer: >60 mL/min (ref >60)
GLUCOSE: 111 mg/dL — AB (ref 65–99)
POTASSIUM: 3.4 mmol/L — AB (ref 3.5–5.1)
Sodium: 135 mmol/L (ref 135–145)

## 2017-04-30 LAB — CBC
HEMATOCRIT: 21.5 % — AB (ref 39.0–52.0)
HEMOGLOBIN: 7.8 g/dL — AB (ref 13.0–17.0)
MCH: 35.6 pg — AB (ref 26.0–34.0)
MCHC: 36.3 g/dL — ABNORMAL HIGH (ref 30.0–36.0)
MCV: 98.2 fL (ref 78.0–100.0)
PLATELETS: 262 10*3/uL (ref 150–400)
RBC: 2.19 MIL/uL — AB (ref 4.22–5.81)
RDW: 13.6 % (ref 11.5–15.5)
WBC: 11 10*3/uL — AB (ref 4.0–10.5)

## 2017-04-30 MED ORDER — OXYCODONE HCL 5 MG PO TABS: ORAL_TABLET | ORAL | 0 refills | Status: AC

## 2017-04-30 MED ORDER — ENOXAPARIN SODIUM 40 MG/0.4ML ~~LOC~~ SOLN: 40.0000 mg | SUBCUTANEOUS | 1 refills | Status: DC

## 2017-04-30 MED ORDER — FERROUS SULFATE 325 (65 FE) MG PO TABS
325.0000 mg | ORAL_TABLET | Freq: Three times a day (TID) | ORAL | Status: DC
  Administered 2017-04-30 – 2017-05-03 (×7): 325 mg via ORAL
  Filled 2017-04-30 (×7): qty 1

## 2017-04-30 MED ORDER — ACETAMINOPHEN 500 MG PO TABS: 1000.0000 mg | ORAL_TABLET | Freq: Three times a day (TID) | ORAL | 0 refills | Status: AC

## 2017-04-30 MED ORDER — POTASSIUM CHLORIDE CRYS ER 20 MEQ PO TBCR
40.0000 meq | EXTENDED_RELEASE_TABLET | Freq: Once | ORAL | Status: AC
  Administered 2017-04-30: 40 meq via ORAL
  Filled 2017-04-30: qty 2

## 2017-04-30 MED ORDER — POLYETHYLENE GLYCOL 3350 17 G PO PACK
17.0000 g | PACK | Freq: Every day | ORAL | Status: DC
  Administered 2017-04-30 – 2017-05-03 (×4): 17 g via ORAL
  Filled 2017-04-30 (×4): qty 1

## 2017-04-30 MED ORDER — SENNOSIDES-DOCUSATE SODIUM 8.6-50 MG PO TABS
1.0000 | ORAL_TABLET | Freq: Every day | ORAL | Status: DC
  Administered 2017-04-30 – 2017-05-02 (×3): 1 via ORAL
  Filled 2017-04-30 (×3): qty 1

## 2017-04-30 MED ORDER — DOCUSATE SODIUM 100 MG PO CAPS
100.0000 mg | ORAL_CAPSULE | Freq: Every day | ORAL | Status: DC
  Administered 2017-05-01 – 2017-05-03 (×3): 100 mg via ORAL
  Filled 2017-04-30 (×3): qty 1

## 2017-04-30 NOTE — Progress Notes (Addendum)
PROGRESS NOTE    Elijah GRAYSON  Nixon:[id number] DOB: [birth year] DOA: 04/28/2017 PCP: Judy Pimple, MD     Brief Narrative:  Elijah Nixon is a 84 y.o. male with medical history significant for HTN, alcohol abuse, arthritis, prior fall with fracture of his left wrist and ribs, history of Grover's disease, brought to the emergency department after getting out of his bed, and suffering a mechanical fall, landing on his right hip.  He denies any loss of consciousness, or hitting his head.  He denies any vision changes or headaches.  He denies any chest pain or palpitations.  He denies any significant shortness of breath, or productive cough.  At times, he does not take deep breath, due to residual pain from his recent rib fracture, but this is not an acute or worsening issue. The patient drinks chronically about 10 beers a day, last on 04/27/2017 at 10 PM.  He denies any tobacco or recreational drug use. Right hip x-ray showed comminuted displaced right intertrochanteric hip fracture. He underwent fixation 1/12 with Dr. Everardo Pacific.   Assessment & Plan:   Active Problems:   Alcohol abuse   Essential hypertension   GEN OSTEOARTHROSIS INVOLVING MULTIPLE SITES   COLONIC POLYPS, HX OF   Seborrheic keratosis   BPH (benign prostatic hyperplasia)   Anemia   Grover's disease   S/P right hip fracture   Hyponatremia   Right hip fracture following a mechanical fall -Underwent fixation 1/12 with Dr. Everardo Pacific -PT OT eval, SNF recommended  -WBAT -Lovenox for 2 weeks  -Follow up with Dr. Everardo Pacific 10-14 days   Alcohol withdrawal -Wife admits that patient drinks 6 pack beer every day for many years   -CIWA with Ativan per protocol -Thiamine, folate, and MVI  Post-op blood loss anemia on chronic iron deficiency anemia -In October 2018, his ferritin was low at 17.9. At that time, he had declined colonoscopy but underwent serial hemoccult cards which were negative.  -Will start iron supplementation with  stool softeners -Trend CBC   Hypertension  -Continue norvasc   Hyponatremia -Resolved after IVF   Hypokalemia -Replace, trend   Benign prostatic hypertrophy -Continue Cardura   DVT prophylaxis: Lovenox  Code Status: DNR Family Communication: At bedside Disposition Plan: SNF once out of alcohol withdrawal    Consultants:   Orthopedic surgery   Procedures:   Right hip fixation 1/12 with Dr. Everardo Pacific  Antimicrobials:  Anti-infectives (From admission, onward)   Start     Dose/Rate Route Frequency Ordered Stop   04/28/17 1600  ceFAZolin (ANCEF) IVPB 2g/100 mL premix     2 g 200 mL/hr over 30 Minutes Intravenous Every 6 hours 04/28/17 1332 04/28/17 2316   04/28/17 0931  ceFAZolin (ANCEF) 2-4 GM/100ML-% IVPB    Comments:  Shireen Quan   : cabinet override      04/28/17 0931 04/28/17 1004   04/28/17 0930  ceFAZolin (ANCEF) IVPB 2g/100 mL premix     2 g 200 mL/hr over 30 Minutes Intravenous  Once 04/28/17 0929 04/28/17 1014   04/28/17 0915  ceFAZolin (ANCEF) powder 2 g  Status:  Discontinued     2 g Other To Surgery 04/28/17 0910 04/28/17 0929       Subjective: No new events, tired.   Objective: Vitals:   04/29/17 0038 04/29/17 0413 04/29/17 2125 04/30/17 0435  BP: 132/62 114/77 (!) 153/65 (!) 151/65  Pulse: 84 87 97 (!) 106  Resp:   18 18  Temp: 98.7 F (37.1 C)  98.2 F (36.8 C) 99.4 F (37.4 C) 100 F (37.8 C)  TempSrc: Oral Oral Oral Oral  SpO2: 98% 100% 98% 97%  Weight:      Height:        Intake/Output Summary (Last 24 hours) at 04/30/2017 1139 Last data filed at 04/30/2017 0436 Gross per 24 hour  Intake 480 ml  Output 550 ml  Net -70 ml   Filed Weights   04/28/17 0602  Weight: 72.6 kg (160 lb)    Examination:  General exam: Appears calm and comfortable  Respiratory system: Clear to auscultation. Respiratory effort normal. Cardiovascular system: S1 & S2 heard, RRR. No JVD, murmurs, rubs, gallops or clicks. No pedal edema. Gastrointestinal  system: Abdomen is nondistended, soft and nontender. No organomegaly or masses felt. Normal bowel sounds heard. Central nervous system: Alert to voice but lethargic. +Bilateral tremors of hands  Extremities: Symmetric Skin: No rashes, lesions or ulcers  Data Reviewed: I have personally reviewed following labs and imaging studies  CBC: Recent Labs  Lab 04/28/17 0632 04/28/17 1330 04/29/17 0705 04/30/17 0741  WBC 10.5 19.3* 9.2 11.0*  NEUTROABS 7.9*  --   --   --   HGB 12.2* 10.3* 8.5* 7.8*  HCT 34.4* 29.4* 23.4* 21.5*  MCV 91.7 92.7 97.5 98.2  PLT 311 278 284 262   Basic Metabolic Panel: Recent Labs  Lab 04/28/17 0632 04/28/17 1330 04/29/17 0705 04/30/17 0741  NA 129*  --  131* 135  K 3.5  --  3.4* 3.4*  CL 98*  --  103 106  CO2 22  --  22 22  GLUCOSE 116*  --  121* 111*  BUN 9  --  8 8  CREATININE 0.64 0.79 0.87 0.72  CALCIUM 8.7*  --  8.0* 7.9*   GFR: Estimated Creatinine Clearance: 68.9 mL/min (by C-G formula based on SCr of 0.72 mg/dL). Liver Function Tests: Recent Labs  Lab 04/28/17 0632  AST 24  ALT 22  ALKPHOS 73  BILITOT 0.7  PROT 6.1*  ALBUMIN 3.2*   No results for input(s): LIPASE, AMYLASE in the last 168 hours. No results for input(s): AMMONIA in the last 168 hours. Coagulation Profile: Recent Labs  Lab 04/28/17 0906  INR 0.92   Cardiac Enzymes: No results for input(s): CKTOTAL, CKMB, CKMBINDEX, TROPONINI in the last 168 hours. BNP (last 3 results) No results for input(s): PROBNP in the last 8760 hours. HbA1C: No results for input(s): HGBA1C in the last 72 hours. CBG: No results for input(s): GLUCAP in the last 168 hours. Lipid Profile: No results for input(s): CHOL, HDL, LDLCALC, TRIG, CHOLHDL, LDLDIRECT in the last 72 hours. Thyroid Function Tests: No results for input(s): TSH, T4TOTAL, FREET4, T3FREE, THYROIDAB in the last 72 hours. Anemia Panel: No results for input(s): VITAMINB12, FOLATE, FERRITIN, TIBC, IRON, RETICCTPCT in the  last 72 hours. Sepsis Labs: No results for input(s): PROCALCITON, LATICACIDVEN in the last 168 hours.  No results found for this or any previous visit (from the past 240 hour(s)).     Radiology Studies: Dg Hip Port Unilat With Pelvis 1v Right  Result Date: 04/28/2017 CLINICAL DATA:  Operative fixation of a comminuted right intertrochanteric fracture. EXAM: DG HIP (WITH OR WITHOUT PELVIS) 1V PORT RIGHT COMPARISON:  Earlier today. FINDINGS: Interval screw and rod fixation of the previously demonstrated comminuted intertrochanteric fracture of the right femur. Significantly improved position and alignment with essentially anatomic position and alignment of the major fragments. Lower lumbar spine degenerative changes. IMPRESSION: Operative fixation  of the previously demonstrated right femoral intertrochanteric fracture. Electronically Signed   By: Beckie SaltsSteven  Reid M.D.   On: 04/28/2017 12:06      Scheduled Meds: . amLODipine  10 mg Oral Daily  . doxazosin  8 mg Oral Daily  . enoxaparin (LOVENOX) injection  40 mg Subcutaneous Q24H  . ferrous sulfate  325 mg Oral TID WC  . folic acid  1 mg Oral Daily  . multivitamin with minerals  1 tablet Oral Daily  . senna-docusate  1 tablet Oral QHS  . thiamine  100 mg Oral Daily   Or  . thiamine  100 mg Intravenous Daily   Continuous Infusions: . sodium chloride 100 mL/hr at 04/29/17 0548     LOS: 2 days    Time spent: 30 minutes   Noralee StainJennifer Vernida Mcnicholas, DO [facility] [email or web address] Password [email or web address] 04/30/2017, 11:39 AM

## 2017-04-30 NOTE — Clinical Social Work Note (Signed)
Clinical Social Work Assessment  Patient Details  Name: Elijah Nixon MRN: [id number] Date of Birth: 1933/01/28  Date of referral:  04/30/17               Reason for consult:  Facility Placement                Permission sought to share information with:  Oceanographeracility Contact Representative Permission granted to share information::  Yes, Verbal Permission Granted  Name::     Elijah Nixon  Agency::  SNF  Relationship::  spouse  Contact Information:     Housing/Transportation Living arrangements for the past 2 months:  Independent Living Facility Source of Information:  Spouse Patient Interpreter Needed:  None Criminal Activity/Legal Involvement Pertinent to Current Situation/Hospitalization:  No - Comment as needed Significant Relationships:  Spouse Lives with:  Spouse Do you feel safe going back to the place where you live?  No Need for family participation in patient care:  Yes (Comment)  Care giving concerns:  Pt from home with spouse and was independent with ADL's and ambulation prior to hospitalization. Spouse indicated that patient walked slow, but did not use any device. Spouse indicated that they are interested in SNF and she is aware of [facility] and [facility]. CSW obtained permission to send to SNF's to see who can offer a bed.  CSW explained the SNF process/options.  Spouse voiced understanding. Pt was asleep during visit.  Social Worker assessment / plan:  CSW will f/u for disposition.  Employment status:  Retired Database administratornsurance information:  Managed Medicare PT Recommendations:  Skilled Nursing Facility Information / Referral to community resources:  Skilled Nursing Facility  Patient/Family's Response to care:  Family appreciative of CSW f/u for SNF placement and discussing options. No other issues or concerns identified.  Patient/Family's Understanding of and Emotional Response to Diagnosis, Current Treatment, and Prognosis:  Family has good  understanding of patient's impairment and agreed with recommendation for SNF placement. Family hopeful that patient will improve and return to baseline. Pt provided CSW with SNF's that she was aware of and interested in as she acknowledges that patient cannot return given his new impairment.  No other issues or concerns identified.   Emotional Assessment Appearance:  Appears stated age Attitude/Demeanor/Rapport:  Unable to Assess, Other(Asleep) Affect (typically observed):  (Asleep) Orientation:  Oriented to Self, Oriented to Place, Oriented to Situation, Oriented to  Time Alcohol / Substance use:  Not Applicable Psych involvement (Current and /or in the community):  No (Comment)  Discharge Needs  Concerns to be addressed:  Discharge Planning Concerns Readmission within the last 30 days:  Yes Current discharge risk:  Physical Impairment, Dependent with Mobility Barriers to Discharge:  No Barriers Identified   Tresa Mooreatricia V Dace Denn, LCSW 04/30/2017, 2:57 PM

## 2017-04-30 NOTE — NC FL2 (Addendum)
[place] MEDICAID FL2 LEVEL OF CARE SCREENING TOOL     IDENTIFICATION  Patient Name: Elijah RasmussenBilly W Sargent Birthdate: 1932/09/09 Sex: male Admission Date (Current Location): 04/28/2017  [facility] [place]   Facility and Address:  [facility] [place], [address], [place], [place] [address]      Provider Number: [id number]  Attending Physician Name and Address:  Noralee Stainhoi, Jennifer, DO  Relative Name and Phone Number:       Current Level of Care: Hospital Recommended Level of Care: Skilled Nursing Facility Prior Approval Number:    Date Approved/Denied:   PASRR Number: [id number]  Discharge Plan: SNF    Current Diagnoses: Patient Active Problem List   Diagnosis Date Noted  . S/P right hip fracture 04/28/2017  . Hyponatremia 04/28/2017  . Prediabetes 10/17/2016  . Anemia 10/17/2016  . 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 BPH (benign prostatic hyperplasia) 10/07/2016  . Rash and nonspecific skin eruption 08/30/2016  . Lumbar disc disease 05/06/2015  . Leg pain, bilateral 05/05/2015  . Routine general medical examination at a health care facility 10/02/2014  . Colon cancer screening 09/29/2013  . Encounter for Medicare annual wellness exam 09/27/2012  . Seborrheic keratosis 03/29/2012  . SWELLING, MASS, OR LUMP IN CHEST 06/24/2009  . ERECTILE DYSFUNCTION 02/16/2009  . ACTINIC KERATOSIS 02/16/2009  . ADVERSE DRUG REACTION 11/13/2008  . Alcohol abuse 10/27/2008  . Essential hypertension 10/27/2008  . GEN OSTEOARTHROSIS INVOLVING MULTIPLE SITES 10/27/2008  . Osteoporosis 10/27/2008  . COLONIC POLYPS, HX OF 10/27/2008  . DIVERTICULITIS, HX OF 10/27/2008    Orientation RESPIRATION BLADDER Height & Weight     Self, Time, Situation, Place  Normal Continent Weight: 160 lb (72.6 kg) Height:  5\' 8"  (172.7 cm)  BEHAVIORAL SYMPTOMS/MOOD NEUROLOGICAL BOWEL NUTRITION STATUS      Continent Diet(see DC summary)  AMBULATORY STATUS COMMUNICATION OF  NEEDS Skin   Extensive Assist Verbally Surgical wounds(right leg- silicone dressing changes )                       Personal Care Assistance Level of Assistance  Bathing, Dressing Bathing Assistance: Maximum assistance   Dressing Assistance: Maximum assistance     Functional Limitations Info             SPECIAL CARE FACTORS FREQUENCY  PT (By licensed PT), OT (By licensed OT)     PT Frequency: 5/wk OT Frequency: 5/wk            Contractures      Additional Factors Info  Code Status, Allergies Code Status Info: DNR Allergies Info: Lisinopril           Current Medications (04/30/2017):  This is the current hospital active medication list Current Facility-Administered Medications  Medication Dose Route Frequency Provider Last Rate Last Dose  . 0.9 %  sodium chloride infusion   Intravenous Continuous Marcos EkeWertman, Sara E, PA-C 100 mL/hr at 04/29/17 0548    . amLODipine (NORVASC) tablet 10 mg  10 mg Oral Daily Marcos EkeWertman, Sara E, PA-C   10 mg at 04/30/17 1021  . bisacodyl (DULCOLAX) EC tablet 5 mg  5 mg Oral Daily PRN Marcos EkeWertman, Sara E, PA-C      . doxazosin (CARDURA) tablet 8 mg  8 mg Oral Daily Marlowe KaysWertman, Sara E, PA-C   8 mg at 04/30/17 1020  . enoxaparin (LOVENOX) injection 40 mg  40 mg Subcutaneous Q24H Ramond MarrowVarkey, Dax T, MD   40 mg at 04/30/17  1022  . folic acid (FOLVITE) tablet 1 mg  1 mg Oral Daily Marcos Eke, PA-C   1 mg at 04/30/17 1020  . HYDROmorphone (DILAUDID) injection 0.5 mg  0.5 mg Intravenous Q3H PRN Marcos Eke, PA-C   0.5 mg at 04/30/17 0024  . LORazepam (ATIVAN) tablet 1 mg  1 mg Oral Q6H PRN Marcos Eke, PA-C       Or  . LORazepam (ATIVAN) injection 1 mg  1 mg Intravenous Q6H PRN Marcos Eke, PA-C   1 mg at 04/30/17 0701  . menthol-cetylpyridinium (CEPACOL) lozenge 3 mg  1 lozenge Oral PRN Bjorn Pippin, MD       Or  . phenol (CHLORASEPTIC) mouth spray 1 spray  1 spray Mouth/Throat PRN Bjorn Pippin, MD      . multivitamin with minerals  tablet 1 tablet  1 tablet Oral Daily Marcos Eke, PA-C   1 tablet at 04/30/17 1021  . oxyCODONE (Oxy IR/ROXICODONE) immediate release tablet 5-10 mg  5-10 mg Oral Q4H PRN Bjorn Pippin, MD   10 mg at 04/30/17 0656  . senna-docusate (Senokot-S) tablet 1 tablet  1 tablet Oral QHS PRN Marcos Eke, PA-C      . sodium phosphate (FLEET) 7-19 GM/118ML enema 1 enema  1 enema Rectal Once PRN Marcos Eke, PA-C      . thiamine (VITAMIN B-1) tablet 100 mg  100 mg Oral Daily Marcos Eke, PA-C   100 mg at 04/30/17 1020   Or  . thiamine (B-1) injection 100 mg  100 mg Intravenous Daily Marcos Eke, PA-C         Discharge Medications: Please see discharge summary for a list of discharge medications.  Relevant Imaging Results:  Relevant Lab Results:   Additional Information SS#: [id number]  Andree Coss, MD

## 2017-05-01 ENCOUNTER — Encounter (HOSPITAL_COMMUNITY): Payer: Self-pay | Admitting: Orthopaedic Surgery

## 2017-05-01 DIAGNOSIS — D500 Iron deficiency anemia secondary to blood loss (chronic): Secondary | ICD-10-CM

## 2017-05-01 LAB — CBC
HCT: 22.9 % — ABNORMAL LOW (ref 39.0–52.0)
HEMOGLOBIN: 7.8 g/dL — AB (ref 13.0–17.0)
MCH: 32.8 pg (ref 26.0–34.0)
MCHC: 34.1 g/dL (ref 30.0–36.0)
MCV: 96.2 fL (ref 78.0–100.0)
Platelets: 254 10*3/uL (ref 150–400)
RBC: 2.38 MIL/uL — ABNORMAL LOW (ref 4.22–5.81)
RDW: 13.4 % (ref 11.5–15.5)
WBC: 12.8 10*3/uL — ABNORMAL HIGH (ref 4.0–10.5)

## 2017-05-01 LAB — BASIC METABOLIC PANEL
Anion gap: 8 (ref 5–15)
BUN: 7 mg/dL (ref 6–20)
CHLORIDE: 103 mmol/L (ref 101–111)
CO2: 21 mmol/L — ABNORMAL LOW (ref 22–32)
CREATININE: 0.76 mg/dL (ref 0.61–1.24)
Calcium: 7.8 mg/dL — ABNORMAL LOW (ref 8.9–10.3)
GFR calc Af Amer: >60 mL/min (ref >60)
GFR calc non Af Amer: >60 mL/min (ref >60)
GLUCOSE: 111 mg/dL — AB (ref 65–99)
Potassium: 3.4 mmol/L — ABNORMAL LOW (ref 3.5–5.1)
Sodium: 132 mmol/L — ABNORMAL LOW (ref 135–145)

## 2017-05-01 LAB — VITAMIN B12: Vitamin B-12: 622 pg/mL (ref 180–914)

## 2017-05-01 LAB — IRON AND TIBC
Iron: 7 ug/dL — ABNORMAL LOW (ref 45–182)
Saturation Ratios: 3 % — ABNORMAL LOW (ref 17.9–39.5)
TIBC: 246 ug/dL — ABNORMAL LOW (ref 250–450)
UIBC: 239 ug/dL

## 2017-05-01 LAB — FERRITIN: FERRITIN: 72 ng/mL (ref 24–336)

## 2017-05-01 LAB — RETICULOCYTES
RBC.: 2.4 MIL/uL — AB (ref 4.22–5.81)
RETIC COUNT ABSOLUTE: 21.6 10*3/uL (ref 19.0–186.0)
Retic Ct Pct: 0.9 % (ref 0.4–3.1)

## 2017-05-01 LAB — MAGNESIUM: Magnesium: 1.8 mg/dL (ref 1.7–2.4)

## 2017-05-01 LAB — FOLATE: FOLATE: 36.9 ng/mL (ref >5.9)

## 2017-05-01 MED ORDER — POTASSIUM CHLORIDE CRYS ER 20 MEQ PO TBCR
40.0000 meq | EXTENDED_RELEASE_TABLET | Freq: Two times a day (BID) | ORAL | Status: AC
  Administered 2017-05-01 – 2017-05-02 (×4): 40 meq via ORAL
  Filled 2017-05-01 (×4): qty 2

## 2017-05-01 NOTE — Plan of Care (Signed)
  Progressing Education: Knowledge of General Education information will improve 05/01/2017 1036 - Progressing by Quentin CornwallMadison, Lennyx Verdell, RN Health Behavior/Discharge Planning: Ability to manage health-related needs will improve 05/01/2017 1036 - Progressing by Quentin CornwallMadison, Richmond Coldren, RN Clinical Measurements: Ability to maintain clinical measurements within normal limits will improve 05/01/2017 1036 - Progressing by Quentin CornwallMadison, Estevon Fluke, RN Will remain free from infection 05/01/2017 1036 - Progressing by Quentin CornwallMadison, Sostenes Kauffmann, RN Diagnostic test results will improve 05/01/2017 1036 - Progressing by Quentin CornwallMadison, Justus Droke, RN Respiratory complications will improve 05/01/2017 1036 - Progressing by Quentin CornwallMadison, Ammar Moffatt, RN Cardiovascular complication will be avoided 05/01/2017 1036 - Progressing by Quentin CornwallMadison, Zayquan Bogard, RN Activity: Risk for activity intolerance will decrease 05/01/2017 1036 - Progressing by Quentin CornwallMadison, Joannie Medine, RN Nutrition: Adequate nutrition will be maintained 05/01/2017 1036 - Progressing by Quentin CornwallMadison, Aymen Widrig, RN Coping: Level of anxiety will decrease 05/01/2017 1036 - Progressing by Quentin CornwallMadison, Kandyce Dieguez, RN Elimination: Will not experience complications related to bowel motility 05/01/2017 1036 - Progressing by Quentin CornwallMadison, Jalayla Chrismer, RN Will not experience complications related to urinary retention 05/01/2017 1036 - Progressing by Quentin CornwallMadison, Darcelle Herrada, RN Pain Managment: General experience of comfort will improve 05/01/2017 1036 - Progressing by Quentin CornwallMadison, Prabhjot Piscitello, RN Safety: Ability to remain free from injury will improve 05/01/2017 1036 - Progressing by Quentin CornwallMadison, Noland Pizano, RN Skin Integrity: Risk for impaired skin integrity will decrease 05/01/2017 1036 - Progressing by Quentin CornwallMadison, Malon Branton, RN Education: Verbalization of understanding the information provided (i.e., activity precautions, restrictions, etc) will improve 05/01/2017 1036 - Progressing by Quentin CornwallMadison, Jazzmin Newbold, RN Activity: Ability to ambulate and perform ADLs will  improve 05/01/2017 1036 - Progressing by Quentin CornwallMadison, Keeli Roberg, RN Clinical Measurements: Postoperative complications will be avoided or minimized 05/01/2017 1036 - Progressing by Quentin CornwallMadison, Prescott Truex, RN Self-Concept: Ability to maintain and perform role responsibilities to the fullest extent possible will improve 05/01/2017 1036 - Progressing by Quentin CornwallMadison, Alyxis Grippi, RN Pain Management: Pain level will decrease 05/01/2017 1036 - Progressing by Quentin CornwallMadison, Mekhi Lascola, RN

## 2017-05-01 NOTE — Social Work (Signed)
CSW met with patient and spouse at bedside and discsused SNF offers.  Patient/Spouse has accepted SNF bed offer from [facility].  CSW confirmed SNF bed offer with SNF.  CSW will continue to follow for discharge to [facility].  Elissa Hefty, LCSW Clinical Social Worker [phone]

## 2017-05-01 NOTE — Progress Notes (Signed)
PROGRESS NOTE    Elijah Nixon  ZOX:[id number] DOB: Aug 31, 1932 DOA: 04/28/2017 PCP: Judy Pimple, MD     Brief Narrative:  Elijah Nixon is a 84 y.o. male with medical history significant for HTN, alcohol abuse, arthritis, prior fall with fracture of his left wrist and ribs, history of Grover's disease, brought to the emergency department after getting out of his bed, and suffering a mechanical fall, landing on his right hip.  He denies any loss of consciousness, or hitting his head.  He denies any vision changes or headaches.  He denies any chest pain or palpitations.  He denies any significant shortness of breath, or productive cough.  At times, he does not take deep breath, due to residual pain from his recent rib fracture, but this is not an acute or worsening issue. The patient drinks chronically about 10 beers a day, last on 04/27/2017 at 10 PM.  He denies any tobacco or recreational drug use. Right hip x-ray showed comminuted displaced right intertrochanteric hip fracture. He underwent fixation 1/12 with Dr. Everardo Pacific.   Assessment & Plan:   Active Problems:   Alcohol abuse   Essential hypertension   GEN OSTEOARTHROSIS INVOLVING MULTIPLE SITES   COLONIC POLYPS, HX OF   Seborrheic keratosis   BPH (benign prostatic hyperplasia)   Anemia   Grover's disease   S/P right hip fracture   Hyponatremia   Right hip fracture following a mechanical fall -Underwent fixation 1/12 with Dr. Everardo Pacific -PT OT eval, SNF recommended , insurance authorization pending -WBAT -Lovenox for 2 weeks  -Follow up with Dr. Everardo Pacific 10-14 days  Slight fever postop without any focal signs of infection  Alcohol withdrawal -Wife admits that patient drinks 6 pack beer every day for many years   -CIWA with Ativan per protocol -Thiamine, folate, and MVI  Post-op blood loss anemia on chronic iron deficiency anemia -In October 2018, his ferritin was low at 17.9. At that time, he had declined colonoscopy but  underwent serial hemoccult cards which were negative.  -Will start iron supplementation with stool softeners -Hemoglobin 12.2 on admission, now 7.8, will check FOBT, anemia panel Transfuse for hemoglobin less than 7 Start patient on a PPI given his alcohol history  Hypertension  -Continue norvasc   Hyponatremia -Resolved after IVF   Hypokalemia -Replace, trend   Benign prostatic hypertrophy -Continue Cardura   DVT prophylaxis: Lovenox  Code Status: DNR Family Communication: At bedside Disposition Plan: SNF once out of alcohol withdrawal likely tomorrow   Consultants:   Orthopedic surgery   Procedures:   Right hip fixation 1/12 with Dr. Everardo Pacific  Antimicrobials:  Anti-infectives (From admission, onward)   Start     Dose/Rate Route Frequency Ordered Stop   04/28/17 1600  ceFAZolin (ANCEF) IVPB 2g/100 mL premix     2 g 200 mL/hr over 30 Minutes Intravenous Every 6 hours 04/28/17 1332 04/28/17 2316   04/28/17 0931  ceFAZolin (ANCEF) 2-4 GM/100ML-% IVPB    Comments:  Shireen Quan   : cabinet override      04/28/17 0931 04/28/17 1004   04/28/17 0930  ceFAZolin (ANCEF) IVPB 2g/100 mL premix     2 g 200 mL/hr over 30 Minutes Intravenous  Once 04/28/17 0929 04/28/17 1014   04/28/17 0915  ceFAZolin (ANCEF) powder 2 g  Status:  Discontinued     2 g Other To Surgery 04/28/17 0910 04/28/17 0929       Subjective: Patient appears to be slightly tremulous and confused with  a low-grade fever overnight, denies any cough, chills, sore throat  Objective: Vitals:   04/30/17 0435 04/30/17 2211 05/01/17 0025 05/01/17 0650  BP: (!) 151/65 140/61 (!) 148/57 (!) 161/67  Pulse: (!) 106 100 (!) 107 (!) 107  Resp: 18 17 17 17   Temp: 100 F (37.8 C) 98.8 F (37.1 C) 99 F (37.2 C) 98.3 F (36.8 C)  TempSrc: Oral Oral Oral Oral  SpO2: 97% 97% 96% 98%  Weight:      Height:        Intake/Output Summary (Last 24 hours) at 05/01/2017 0925 Last data filed at 04/30/2017 1644 Gross  per 24 hour  Intake -  Output 900 ml  Net -900 ml   Filed Weights   04/28/17 0602  Weight: 72.6 kg (160 lb)    Examination:  General exam: Appears calm and comfortable  Respiratory system: Clear to auscultation. Respiratory effort normal. Cardiovascular system: S1 & S2 heard, RRR. No JVD, murmurs, rubs, gallops or clicks. No pedal edema. Gastrointestinal system: Abdomen is nondistended, soft and nontender. No organomegaly or masses felt. Normal bowel sounds heard. Central nervous system: Alert to voice but lethargic. +Bilateral tremors of hands  Extremities: Symmetric Skin: No rashes, lesions or ulcers  Data Reviewed: I have personally reviewed following labs and imaging studies  CBC: Recent Labs  Lab 04/28/17 0632 04/28/17 1330 04/29/17 0705 04/30/17 0741 05/01/17 0631  WBC 10.5 19.3* 9.2 11.0* 12.8*  NEUTROABS 7.9*  --   --   --   --   HGB 12.2* 10.3* 8.5* 7.8* 7.8*  HCT 34.4* 29.4* 23.4* 21.5* 22.9*  MCV 91.7 92.7 97.5 98.2 96.2  PLT 311 278 284 262 254   Basic Metabolic Panel: Recent Labs  Lab 04/28/17 0632 04/28/17 1330 04/29/17 0705 04/30/17 0741 05/01/17 0631  NA 129*  --  131* 135 132*  K 3.5  --  3.4* 3.4* 3.4*  CL 98*  --  103 106 103  CO2 22  --  22 22 21*  GLUCOSE 116*  --  121* 111* 111*  BUN 9  --  8 8 7   CREATININE 0.64 0.79 0.87 0.72 0.76  CALCIUM 8.7*  --  8.0* 7.9* 7.8*  MG  --   --   --   --  1.8   GFR: Estimated Creatinine Clearance: 68.9 mL/min (by C-G formula based on SCr of 0.76 mg/dL). Liver Function Tests: Recent Labs  Lab 04/28/17 0632  AST 24  ALT 22  ALKPHOS 73  BILITOT 0.7  PROT 6.1*  ALBUMIN 3.2*   No results for input(s): LIPASE, AMYLASE in the last 168 hours. No results for input(s): AMMONIA in the last 168 hours. Coagulation Profile: Recent Labs  Lab 04/28/17 0906  INR 0.92   Cardiac Enzymes: No results for input(s): CKTOTAL, CKMB, CKMBINDEX, TROPONINI in the last 168 hours. BNP (last 3 results) No results  for input(s): PROBNP in the last 8760 hours. HbA1C: No results for input(s): HGBA1C in the last 72 hours. CBG: No results for input(s): GLUCAP in the last 168 hours. Lipid Profile: No results for input(s): CHOL, HDL, LDLCALC, TRIG, CHOLHDL, LDLDIRECT in the last 72 hours. Thyroid Function Tests: No results for input(s): TSH, T4TOTAL, FREET4, T3FREE, THYROIDAB in the last 72 hours. Anemia Panel: No results for input(s): VITAMINB12, FOLATE, FERRITIN, TIBC, IRON, RETICCTPCT in the last 72 hours. Sepsis Labs: No results for input(s): PROCALCITON, LATICACIDVEN in the last 168 hours.  No results found for this or any previous visit (from  the past 240 hour(s)).     Radiology Studies: No results found.    Scheduled Meds: . amLODipine  10 mg Oral Daily  . docusate sodium  100 mg Oral Daily  . doxazosin  8 mg Oral Daily  . enoxaparin (LOVENOX) injection  40 mg Subcutaneous Q24H  . ferrous sulfate  325 mg Oral TID WC  . folic acid  1 mg Oral Daily  . multivitamin with minerals  1 tablet Oral Daily  . polyethylene glycol  17 g Oral Daily  . senna-docusate  1 tablet Oral QHS  . thiamine  100 mg Oral Daily   Or  . thiamine  100 mg Intravenous Daily   Continuous Infusions: . sodium chloride 75 mL/hr at 05/01/17 0013     LOS: 3 days    Time spent: 30 minutes   Richarda OverlieNayana Nalayah Hitt, MD [facility] [email or web address] Password [email or web address] 05/01/2017, 9:25 AM

## 2017-05-01 NOTE — Progress Notes (Signed)
Physical Therapy Treatment Patient Details Name: Elijah Nixon MRN: [id number] DOB: 04-09-1933 Today's Date: 05/01/2017    History of Present Illness Elijah Nixon is a 84 y.o. male with medical history significant for HTN, alcohol abuse, arthritis, prior fall with fracture of his left wrist and ribs, history of Grover's disease, brought to the emergency department after getting out of his bed, and suffering a mechanical fall, landing on his right hip. R hip intertrochanteric fracture, now s/p IM Nail, WBAT    PT Comments    Continuing work on functional mobility and activity tolerance;  Noting improvements in basic pivot transfers; Pt requested to hold on initiating gait training due to pain; continue to recommend SNF for post-acute rehab   Follow Up Recommendations  SNF     Equipment Recommendations  Rolling walker with 5" wheels;3in1 (PT)    Recommendations for Other Services       Precautions / Restrictions Precautions Precautions: Fall Restrictions RLE Weight Bearing: Weight bearing as tolerated    Mobility  Bed Mobility Overal bed mobility: Needs Assistance Bed Mobility: Supine to Sit     Supine to sit: Mod assist;+2 for physical assistance     General bed mobility comments: Assist for LEs to EOB and trunk elevation to sitting. Use of bed pad to scoot hips out to EOB and HOB elevated throughout  Transfers Overall transfer level: Needs assistance Equipment used: None;Rolling walker (2 wheeled) Transfers: Systems analyst;Sit to/from Stand Sit to Stand: Mod assist;+2 physical assistance   Squat pivot transfers: Mod assist     General transfer comment: Performed squat pivot transfer OOB to recliner on pt's L side; cues for hand placement and technqiue; Heavy mod assist to boost; Then worked on straight sit to stand from reclienr to [employer]; mod assist to rise and to steady while moving hands from armrests to [employer]  Ambulation/Gait                  Stairs            Wheelchair Mobility    Modified Rankin (Stroke Patients Only)       Balance     Sitting balance-Leahy Scale: Poor     Standing balance support: Bilateral upper extremity supported Standing balance-Leahy Scale: Poor                              Cognition Arousal/Alertness: Awake/alert Behavior During Therapy: WFL for tasks assessed/performed Overall Cognitive Status: Within Functional Limits for tasks assessed                                        Exercises      General Comments        Pertinent Vitals/Pain Pain Assessment: Faces Faces Pain Scale: Hurts whole lot Pain Location: R hip Pain Descriptors / Indicators: Grimacing;Guarding Pain Intervention(s): RN gave pain meds during session    Home Living                      Prior Function            PT Goals (current goals can now be found in the care plan section) Acute Rehab PT Goals Patient Stated Goal: decrease pain PT Goal Formulation: With patient Time For Goal Achievement: 05/13/17 Potential to Achieve Goals: Good Progress towards PT  goals: Progressing toward goals    Frequency    Min 2X/week      PT Plan Current plan remains appropriate    Co-evaluation              AM-PAC PT "6 Clicks" Daily Activity  Outcome Measure  Difficulty turning over in bed (including adjusting bedclothes, sheets and blankets)?: Unable Difficulty moving from lying on back to sitting on the side of the bed? : Unable Difficulty sitting down on and standing up from a chair with arms (e.g., wheelchair, bedside commode, etc,.)?: Unable Help needed moving to and from a bed to chair (including a wheelchair)?: A Lot Help needed walking in hospital room?: A Lot Help needed climbing 3-5 steps with a railing? : Total 6 Click Score: 8    End of Session Equipment Utilized During Treatment: Gait belt Activity Tolerance: Patient limited by  pain Patient left: in chair;with call bell/phone within reach Nurse Communication: Mobility status PT Visit Diagnosis: Other abnormalities of gait and mobility (R26.89);Pain Pain - Right/Left: Right Pain - part of body: Hip     Time: [id number] PT Time Calculation (min) (ACUTE ONLY): 21 min  Charges:  $Therapeutic Activity: 8-22 mins                    G Codes:       Elijah Nixon, PT  Acute Rehabilitation Services Pager [phone] Office [phone]    Elijah AlandHolly H Aariyana Nixon 05/01/2017, 4:31 PM

## 2017-05-02 DIAGNOSIS — M159 Polyosteoarthritis, unspecified: Secondary | ICD-10-CM

## 2017-05-02 DIAGNOSIS — N400 Enlarged prostate without lower urinary tract symptoms: Secondary | ICD-10-CM

## 2017-05-02 LAB — CBC
HCT: 20.7 % — ABNORMAL LOW (ref 39.0–52.0)
Hemoglobin: 7.3 g/dL — ABNORMAL LOW (ref 13.0–17.0)
MCH: 34.1 pg — ABNORMAL HIGH (ref 26.0–34.0)
MCHC: 35.3 g/dL (ref 30.0–36.0)
MCV: 96.7 fL (ref 78.0–100.0)
Platelets: 298 10*3/uL (ref 150–400)
RBC: 2.14 MIL/uL — ABNORMAL LOW (ref 4.22–5.81)
RDW: 13.3 % (ref 11.5–15.5)
WBC: 11.2 10*3/uL — ABNORMAL HIGH (ref 4.0–10.5)

## 2017-05-02 LAB — PREPARE RBC (CROSSMATCH)

## 2017-05-02 LAB — HEMOGLOBIN AND HEMATOCRIT, BLOOD
HCT: 23.5 % — ABNORMAL LOW (ref 39.0–52.0)
Hemoglobin: 8 g/dL — ABNORMAL LOW (ref 13.0–17.0)

## 2017-05-02 MED ORDER — FOLIC ACID 1 MG PO TABS: 1.0000 mg | ORAL_TABLET | Freq: Every day | ORAL | 2 refills | Status: DC

## 2017-05-02 MED ORDER — POTASSIUM CHLORIDE CRYS ER 20 MEQ PO TBCR: 40.0000 meq | EXTENDED_RELEASE_TABLET | Freq: Two times a day (BID) | ORAL | 1 refills | Status: DC

## 2017-05-02 MED ORDER — NYSTATIN 100000 UNIT/GM EX POWD
Freq: Three times a day (TID) | CUTANEOUS | Status: DC
  Administered 2017-05-02 – 2017-05-03 (×2): via TOPICAL
  Filled 2017-05-02: qty 15

## 2017-05-02 MED ORDER — SENNOSIDES-DOCUSATE SODIUM 8.6-50 MG PO TABS: 1.0000 | ORAL_TABLET | Freq: Every day | ORAL | 1 refills | Status: DC

## 2017-05-02 MED ORDER — FUROSEMIDE 10 MG/ML IJ SOLN
40.0000 mg | Freq: Once | INTRAMUSCULAR | Status: AC
  Administered 2017-05-02: 40 mg via INTRAVENOUS
  Filled 2017-05-02: qty 4

## 2017-05-02 MED ORDER — NYSTATIN 100000 UNIT/GM EX POWD: Freq: Three times a day (TID) | CUTANEOUS | 0 refills | Status: DC

## 2017-05-02 MED ORDER — THIAMINE HCL 100 MG PO TABS: 100.0000 mg | ORAL_TABLET | Freq: Every day | ORAL | 1 refills | Status: DC

## 2017-05-02 MED ORDER — POLYETHYLENE GLYCOL 3350 17 G PO PACK: 17.0000 g | PACK | Freq: Every day | ORAL | 0 refills | Status: DC

## 2017-05-02 MED ORDER — PANTOPRAZOLE SODIUM 40 MG PO TBEC: 40.0000 mg | DELAYED_RELEASE_TABLET | Freq: Every day | ORAL | 1 refills | Status: DC

## 2017-05-02 MED ORDER — SODIUM CHLORIDE 0.9 % IV SOLN
Freq: Once | INTRAVENOUS | Status: AC
Start: 2017-05-02 — End: 2017-05-02
  Administered 2017-05-02: 11:00:00 via INTRAVENOUS

## 2017-05-02 MED ORDER — FERROUS SULFATE 325 (65 FE) MG PO TABS: 325.0000 mg | ORAL_TABLET | Freq: Three times a day (TID) | ORAL | 3 refills | Status: DC

## 2017-05-02 NOTE — Social Work (Signed)
CSW f/u with SNF-[facility] and they indicated that they are still waiting on Insurance Auth.  CSW advised clinical team of same.  [facility] and CSW will f/u in the morning.  Keene BreathPatricia Archit Leger, LCSW Clinical Social Worker [phone]

## 2017-05-02 NOTE — Care Management Important Message (Signed)
Important Message  Patient Details  Name: Elijah Nixon MRN: [id number] Date of Birth: March 13, 1933   Medicare Important Message Given:  Yes    Dorena BodoIris Neeya Prigmore 05/02/2017, 2:22 PM

## 2017-05-02 NOTE — Clinical Social Work Placement (Addendum)
   CLINICAL SOCIAL WORK PLACEMENT  NOTE  Date:  05/02/2017  Patient Details  Name: Elijah Nixon MRN: [id number] Date of Birth: 19-May-1932  Clinical Social Work is seeking post-discharge placement for this patient at the Skilled  Nursing Facility level of care (*CSW will initial, date and re-position this form in  chart as items are completed):  Yes   Patient/family provided with [name] Clinical Social Work Department's list of facilities offering this level of care within the geographic area requested by the patient (or if unable, by the patient's family).  Yes   Patient/family informed of their freedom to choose among providers that offer the needed level of care, that participate in Medicare, Medicaid or managed care program needed by the patient, have an available bed and are willing to accept the patient.  Yes   Patient/family informed of [facility] ownership interest [facility] and [facility], as well as of the fact that they are under no obligation to receive care at these facilities.  PASRR submitted to EDS on       PASRR number received on 05/02/17     Existing PASRR number confirmed on       FL2 transmitted to all facilities in geographic area requested by pt/family on 05/02/17     FL2 transmitted to all facilities within larger geographic area on       Patient informed that his/her managed care company has contracts with or will negotiate with certain facilities, including the following:        Yes   Patient/family informed of bed offers received.  [facility]     Physician recommends and patient chooses bed at      [facility].  Patient to be transferred to facility by [facility]     Patient family notified on 05/03/17 of transfer.  Name of family member notified:  spouse advised     PHYSICIAN       Additional Comment:     _______________________________________________ Tresa MoorePatricia V Ekam Bonebrake, LCSW 05/02/2017, 2:21 PM

## 2017-05-02 NOTE — Discharge Summary (Addendum)
Physician Discharge Summary  Elijah Nixon MRN: [id number] DOB/AGE: July 12, 1932 84 y.o.  PCP: Abner Greenspan, MD   Admit date: 04/28/2017 Discharge date: 05/02/2017  Discharge Diagnoses:    Active Problems:   Alcohol abuse   Essential hypertension   GEN OSTEOARTHROSIS INVOLVING MULTIPLE SITES   COLONIC POLYPS, HX OF   Seborrheic keratosis   BPH (benign prostatic hyperplasia)   Anemia   Grover's disease   S/P right hip fracture   Hyponatremia  Patient did not leave on 1/16 as patient did not have insurance authorization  Follow-up recommendations Follow-up with PCP in 3-5 days , including all  additional recommended appointments as below Follow-up CBC, CMP in 3-5 days Follow-up CBC weekly      Allergies as of 05/02/2017      Reactions   Lisinopril    REACTION: increased potassium leveles      Medication List    STOP taking these medications   ibuprofen 200 MG tablet Commonly known as:  ADVIL,MOTRIN   PERCOCET 5-325 MG tablet Generic drug:  oxyCODONE-acetaminophen     TAKE these medications   acetaminophen 500 MG tablet Commonly known as:  TYLENOL Take 2 tablets (1,000 mg total) by mouth every 8 (eight) hours for 14 days.   amLODipine 10 MG tablet Commonly known as:  NORVASC TAKE 1 TABLET BY MOUTH DAILY   aspirin 81 MG tablet Take 81 mg by mouth daily.   b complex vitamins tablet Take 1 tablet by mouth daily.   Calcium Carbonate-Vit D-Min 600-400 MG-UNIT Tabs Take 1 tablet by mouth daily.   doxazosin 8 MG tablet Commonly known as:  CARDURA TAKE ONE (1) TABLET BY MOUTH EVERY DAY   enoxaparin 40 MG/0.4ML injection Commonly known as:  LOVENOX Inject 0.4 mLs (40 mg total) into the skin daily.   ferrous sulfate 325 (65 FE) MG tablet Take 1 tablet (325 mg total) by mouth 3 (three) times daily with meals.   folic acid 1 MG tablet Commonly known as:  FOLVITE Take 1 tablet (1 mg total) by mouth daily.   hydrOXYzine 25 MG tablet Commonly known  as:  ATARAX/VISTARIL Take 0.5-1 tablets (12.5-25 mg total) by mouth at bedtime as needed for itching.   methocarbamol 500 MG tablet Commonly known as:  ROBAXIN Take 1 or 2 po Q 6hrs for muscle spasms or pain   nystatin powder Commonly known as:  MYCOSTATIN/NYSTOP Apply topically 3 (three) times daily.   oxyCODONE 5 MG immediate release tablet Commonly known as:  Oxy IR/ROXICODONE Take 1-2 pills every 6 hrs as needed for pain   pantoprazole 40 MG tablet Commonly known as:  PROTONIX Take 1 tablet (40 mg total) by mouth daily.   polyethylene glycol packet Commonly known as:  MIRALAX / GLYCOLAX Take 17 g by mouth daily.   potassium chloride SA 20 MEQ tablet Commonly known as:  K-DUR,KLOR-CON Take 2 tablets (40 mEq total) by mouth 2 (two) times daily.   PRESERVISION/LUTEIN Caps Take 1 capsule by mouth daily.   senna-docusate 8.6-50 MG tablet Commonly known as:  Senokot-S Take 1 tablet by mouth at bedtime.   spironolactone 25 MG tablet Commonly known as:  ALDACTONE Take 0.5 tablets (12.5 mg total) by mouth daily.   thiamine 100 MG tablet Take 1 tablet (100 mg total) by mouth daily.   VITAMIN B-12 PO Take 1 tablet by mouth daily.      Discharge Condition: Stable  Discharge Instructions Get Medicines reviewed and adjusted: Please take all your  medications with you for your next visit with your Primary MD  Please request your Primary MD to go over all hospital tests and procedure/radiological results at the follow up, please ask your Primary MD to get all Hospital records sent to his/her office.  If you experience worsening of your admission symptoms, develop shortness of breath, life threatening emergency, suicidal or homicidal thoughts you must seek medical attention immediately by calling 911 or calling your MD immediately if symptoms less severe.  You must read complete instructions/literature along with all the possible adverse reactions/side effects for all the  Medicines you take and that have been prescribed to you. Take any new Medicines after you have completely understood and accpet all the possible adverse reactions/side effects.   Do not drive when taking Pain medications.   Do not take more than prescribed Pain, Sleep and Anxiety Medications  Special Instructions: If you have smoked or chewed Tobacco in the last 2 yrs please stop smoking, stop any regular Alcohol and or any Recreational drug use.  Wear Seat belts while driving.  Please note  You were cared for by a hospitalist during your hospital stay. Once you are discharged, your primary care physician will handle any further medical issues. Please note that NO REFILLS for any discharge medications will be authorized once you are discharged, as it is imperative that you return to your primary care physician (or establish a relationship with a primary care physician if you do not have one) for your aftercare needs so that they can reassess your need for medications and monitor your lab values.     Allergies  Allergen Reactions  . Lisinopril     REACTION: increased potassium leveles      Disposition: SNF   Consults:  Orthopedics  Significant Diagnostic Studies:  Dg Chest 1 View  Result Date: 04/28/2017 CLINICAL DATA:  Hip fracture. EXAM: CHEST 1 VIEW COMPARISON:  February 15, 2017 FINDINGS: The heart, hila, and mediastinum are unremarkable. No pneumothorax. No nodules or masses. No focal infiltrates. IMPRESSION: No active disease. Electronically Signed   By: Dorise Bullion III M.D   On: 04/28/2017 10:03   Dg C-arm 1-60 Min  Result Date: 04/28/2017 CLINICAL DATA:  Operative fixation of a right femoral intertrochanteric fracture. EXAM: RIGHT FEMUR 2 VIEWS; DG C-ARM 61-120 MIN COMPARISON:  Earlier today. FINDINGS: Eight C-arm views of the right femur demonstrate operative placement of and intramedullary rod and fixation screws bridging the previously demonstrated comminuted  right femoral intertrochanteric fracture. The major fragments are in anatomic position and alignment on these views. IMPRESSION: Hardware fixation of the previously demonstrated right intertrochanteric fracture. Electronically Signed   By: Claudie Revering M.D.   On: 04/28/2017 12:08   Dg Hip Port Unilat With Pelvis 1v Right  Result Date: 04/28/2017 CLINICAL DATA:  Operative fixation of a comminuted right intertrochanteric fracture. EXAM: DG HIP (WITH OR WITHOUT PELVIS) 1V PORT RIGHT COMPARISON:  Earlier today. FINDINGS: Interval screw and rod fixation of the previously demonstrated comminuted intertrochanteric fracture of the right femur. Significantly improved position and alignment with essentially anatomic position and alignment of the major fragments. Lower lumbar spine degenerative changes. IMPRESSION: Operative fixation of the previously demonstrated right femoral intertrochanteric fracture. Electronically Signed   By: Claudie Revering M.D.   On: 04/28/2017 12:06   Dg Hip Unilat  With Pelvis 2-3 Views Right  Result Date: 04/28/2017 CLINICAL DATA:  Pain after fall EXAM: DG HIP (WITH OR WITHOUT PELVIS) 2-3V RIGHT COMPARISON:  None. FINDINGS:  There is a comminuted displaced and angulated fracture in the right hip through the intertrochanteric region. No dislocation identified. No other fractures are seen. IMPRESSION: Comminuted displaced and angulated right intertrochanteric hip fracture. Electronically Signed   By: Dorise Bullion III M.D   On: 04/28/2017 07:31   Dg Femur, Min 2 Views Right  Result Date: 04/28/2017 CLINICAL DATA:  Operative fixation of a right femoral intertrochanteric fracture. EXAM: RIGHT FEMUR 2 VIEWS; DG C-ARM 61-120 MIN COMPARISON:  Earlier today. FINDINGS: Eight C-arm views of the right femur demonstrate operative placement of and intramedullary rod and fixation screws bridging the previously demonstrated comminuted right femoral intertrochanteric fracture. The major fragments are in  anatomic position and alignment on these views. IMPRESSION: Hardware fixation of the previously demonstrated right intertrochanteric fracture. Electronically Signed   By: Claudie Revering M.D.   On: 04/28/2017 12:08        Filed Weights   04/28/17 0602  Weight: 72.6 kg (160 lb)     Microbiology: No results found for this or any previous visit (from the past 240 hour(s)).     Blood Culture No results found for: SDES, [place], CULT, REPTSTATUS    Labs: Results for orders placed or performed during the hospital encounter of 04/28/17 (from the past 48 hour(s))  CBC     Status: Abnormal   Collection Time: 05/01/17  6:31 AM  Result Value Ref Range   WBC 12.8 (H) 4.0 - 10.5 K/uL   RBC 2.38 (L) 4.22 - 5.81 MIL/uL   Hemoglobin 7.8 (L) 13.0 - 17.0 g/dL   HCT 22.9 (L) 39.0 - 52.0 %   MCV 96.2 78.0 - 100.0 fL   MCH 32.8 26.0 - 34.0 pg   MCHC 34.1 30.0 - 36.0 g/dL   RDW 13.4 11.5 - 15.5 %   Platelets 254 150 - 400 K/uL  Basic metabolic panel     Status: Abnormal   Collection Time: 05/01/17  6:31 AM  Result Value Ref Range   Sodium 132 (L) 135 - 145 mmol/L   Potassium 3.4 (L) 3.5 - 5.1 mmol/L   Chloride 103 101 - 111 mmol/L   CO2 21 (L) 22 - 32 mmol/L   Glucose, Bld 111 (H) 65 - 99 mg/dL   BUN 7 6 - 20 mg/dL   Creatinine, Ser 0.76 0.61 - 1.24 mg/dL   Calcium 7.8 (L) 8.9 - 10.3 mg/dL   GFR calc non Af Amer >60 >60 mL/min   GFR calc Af Amer >60 >60 mL/min    Comment: (NOTE) The eGFR has been calculated using the CKD EPI equation. This calculation has not been validated in all clinical situations. eGFR's persistently <60 mL/min signify possible Chronic Kidney Disease.    Anion gap 8 5 - 15  Magnesium     Status: None   Collection Time: 05/01/17  6:31 AM  Result Value Ref Range   Magnesium 1.8 1.7 - 2.4 mg/dL  Vitamin B12     Status: None   Collection Time: 05/01/17 11:49 AM  Result Value Ref Range   Vitamin B-12 622 180 - 914 pg/mL    Comment: (NOTE) This assay is not  validated for testing neonatal or myeloproliferative syndrome specimens for Vitamin B12 levels.   Folate     Status: None   Collection Time: 05/01/17 11:49 AM  Result Value Ref Range   Folate 36.9 >5.9 ng/mL    Comment: RESULTS CONFIRMED BY MANUAL DILUTION  Iron and TIBC     Status:  Abnormal   Collection Time: 05/01/17 11:49 AM  Result Value Ref Range   Iron 7 (L) 45 - 182 ug/dL   TIBC 246 (L) 250 - 450 ug/dL   Saturation Ratios 3 (L) 17.9 - 39.5 %   UIBC 239 ug/dL  Ferritin     Status: None   Collection Time: 05/01/17 11:49 AM  Result Value Ref Range   Ferritin 72 24 - 336 ng/mL  Reticulocytes     Status: Abnormal   Collection Time: 05/01/17 11:49 AM  Result Value Ref Range   Retic Ct Pct 0.9 0.4 - 3.1 %   RBC. 2.40 (L) 4.22 - 5.81 MIL/uL   Retic Count, Absolute 21.6 19.0 - 186.0 K/uL  CBC     Status: Abnormal   Collection Time: 05/02/17  5:18 AM  Result Value Ref Range   WBC 11.2 (H) 4.0 - 10.5 K/uL   RBC 2.14 (L) 4.22 - 5.81 MIL/uL   Hemoglobin 7.3 (L) 13.0 - 17.0 g/dL   HCT 20.7 (L) 39.0 - 52.0 %   MCV 96.7 78.0 - 100.0 fL   MCH 34.1 (H) 26.0 - 34.0 pg   MCHC 35.3 30.0 - 36.0 g/dL   RDW 13.3 11.5 - 15.5 %   Platelets 298 150 - 400 K/uL     Lipid Panel     Component Value Date/Time   CHOL 185 10/09/2016 0933   TRIG 33.0 10/09/2016 0933   HDL 110.60 10/09/2016 0933   CHOLHDL 2 10/09/2016 0933   VLDL 6.6 10/09/2016 0933   LDLCALC 68 10/09/2016 0933   LDLDIRECT 88.2 09/20/2012 0817     Lab Results  Component Value Date   HGBA1C 5.8 01/16/2017   HGBA1C 5.5 09/22/2013   HGBA1C 5.5 09/20/2012     Lab Results  Component Value Date   LDLCALC 68 10/09/2016   CREATININE 0.76 05/01/2017     HPI :  Elijah Gorter McDanielis a 84 y.o.malewith medical history significant for HTN,alcohol abuse, arthritis, prior fall with fracture of his left wrist and ribs,history of Grover's disease, brought to the emergency department after getting out of his bed, and  suffering a mechanical fall, landing on his right hip. He denies any loss of consciousness, or hitting his head. He denies any vision changes or headaches. He denies any chest pain or palpitations. He denies any significant shortness of breath, or productive cough. At times, he does not take deep breath, due to residual pain from his recent rib fracture, but this is not an acute or worsening issue. The patient drinks chronically about 10 beers a day, last on 04/27/2017 at 10 PM. He denies any tobacco or recreational drug use. Right hip x-ray showed comminuted displaced right intertrochanteric hip fracture. He underwent fixation 1/12 with Dr. Griffin Basil.     HOSPITAL COURSE:   Right hipfracturefollowinga mechanical fall -Underwent fixation 1/12 with Dr. Griffin Basil -PT OT eval, SNF recommended ,  -WBAT -Lovenox for 2 weeks  for DVT prophylaxis -Follow up with Dr. Griffin Basil 10-14 days   Slight fever postop without any focal signs of infection  Continue incentive spirometry    Alcohol withdrawal -Wife admits that patient drinks 6 pack beer every day for many years  -CIWA with Ativan per protocol -Thiamine, folate, and MVI  Post-op blood loss anemia on chronic iron deficiency anemia -In October 2018, his ferritin was low at 17.9. At that time, he had declined colonoscopy but underwent serial hemoccult cards which were negative.  -Will start iron supplementation with  stool softeners -Hemoglobin 12.2 on admission, now 7.8>7.3 ,   anemia panel consistent with anemia of chronic disease with low iron levels Patient transfused 1 unit of packed red blood cells on 1/16 Started patient on a PPI given his alcohol history  Hypertension -Continue norvasc   Hyponatremia -Resolved after IVF   Hypokalemia -Replace, trend   Benign prostatic hypertrophy -ContinueCardura  Groin rash Continue to apply nystatin powder to the groin area 3 times a day avoid steroid creams in the  Groin  area    Discharge Exam:   Blood pressure 137/70, pulse (!) 103, temperature 99 F (37.2 C), temperature source Oral, resp. rate 16, height _0  (1.727 m), weight 72.6 kg (160 lb), SpO2 96 %.  General exam: Appears calm and comfortable  Respiratory system: Clear to auscultation. Respiratory effort normal. Cardiovascular system: S1 & S2 heard, RRR. No JVD, murmurs, rubs, gallops or clicks. No pedal edema. Gastrointestinal system: Abdomen is nondistended, soft and nontender. No organomegaly or masses felt. Normal bowel sounds heard. Central nervous system: Alert to voice but lethargic. +Bilateral tremors of hands  Extremities: Symmetric Skin: No rashes, lesions or ulcers      Follow-up Information    Tower, Wynelle Fanny, MD. Call.   Specialties:  Family Medicine, Radiology Why:  Hospital follow-up in 3-5 days Contact information: [place] [address] [phone]           Signed: Reyne Dumas 05/02/2017, 9:31 AM        Time spent >1 hour

## 2017-05-02 NOTE — Progress Notes (Signed)
ORTHOPAEDIC PROGRESS NOTE  s/p Procedure(s): INTRAMEDULLARY (IM) NAIL INTERTROCHANTRIC  SUBJECTIVE: Reports mild pain about operative site. No chest pain. No SOB. No nausea/vomiting. No other complaints.  OBJECTIVE: PE:  Vitals:   05/01/17 2018 05/02/17 0351  BP: (!) 120/53 137/70  Pulse: (!) 102 (!) 103  Resp: 17 16  Temp: 99.1 F (37.3 C) 99 F (37.2 C)  SpO2: 97% 96%     ASSESSMENT: Elijah Nixon is a 84 y.o. male doing well postoperatively.  PLAN: Weightbearing: WBAT RLE Insicional and dressing care: OK to remove dressings POD3 and leave open to air with dry gauze PRN Orthopedic device(s): None Showering: PRN, dressing down first VTE prophylaxis: Lovenox 40mg  qd 2 weeks Pain control: prn narcotics, minimize Follow - up plan: 2 weeks Contact information:  Weekdays 8-5 [address] Kyrian Stage MD [phone], After hours and holidays please check [email or web address] for group call information for [facility]

## 2017-05-02 NOTE — Progress Notes (Signed)
Occupational Therapy Treatment Patient Details Name: Elijah Nixon MRN: [id number] DOB: April 20, 1932 Today's Date: 05/02/2017    History of present illness Elijah Nixon is a 84 y.o. male with medical history significant for HTN, alcohol abuse, arthritis, prior fall with fracture of his left wrist and ribs, history of Grover's disease, brought to the emergency department after getting out of his bed, and suffering a mechanical fall, landing on his right hip. R hip intertrochanteric fracture, now s/p IM Nail, WBAT   OT comments  Pt progressing towards goals. Pt completed bed mobility with MaxA, maintaining sitting balance with MinGuard-MinA throughout. Pt engaged in grooming ADLs and UB/LB exercises seated EOB. Pt continues to present with generalized weakness and decreased activity tolerance, requiring rest breaks throughout. Feel POC remains appropriate at this time. Will continue to follow acutely to progress Pt towards established OT goals.    Follow Up Recommendations  SNF;Supervision/Assistance - 24 hour    Equipment Recommendations  Other (comment)(defer to next venue )          Precautions / Restrictions Precautions Precautions: Fall Restrictions Weight Bearing Restrictions: Yes RUE Weight Bearing: Weight bearing as tolerated RLE Weight Bearing: Weight bearing as tolerated       Mobility Bed Mobility Overal bed mobility: Needs Assistance Bed Mobility: Supine to Sit;Sit to Supine     Supine to sit: Max assist Sit to supine: Max assist   General bed mobility comments: Assist for LEs to EOB and trunk elevation to sitting. Use of bed pad to scoot hips out to EOB and HOB elevated throughout; assist for LE management when returning to supine   Transfers                      Balance Overall balance assessment: Needs assistance Sitting-balance support: Feet supported;Bilateral upper extremity supported;Single extremity supported;No upper extremity  supported Sitting balance-Leahy Scale: Poor Sitting balance - Comments: reliant on UE support or external support from therapist                                    ADL either performed or assessed with clinical judgement   ADL Overall ADL's : Needs assistance/impaired     Grooming: Sitting;Wash/dry face;Min guard Grooming Details (indicate cue type and reason): sitting EOB; minguard for static sitting balance              Lower Body Dressing: Total assistance;Bed level Lower Body Dressing Details (indicate cue type and reason): total assist to don socks at bed level                General ADL Comments: Pt completed bed mobility, sitting EOB for grooming ADLs and UB/LB exercises; Pt requires MaxA for bed mobility, MinGuard for static sitting balance using UE support, MinA during dynamic activity, Pt easily fatigued                        Cognition Arousal/Alertness: Awake/alert Behavior During Therapy: WFL for tasks assessed/performed Overall Cognitive Status: Within Functional Limits for tasks assessed  Pertinent Vitals/ Pain       Pain Assessment: Faces Faces Pain Scale: Hurts even more Pain Location: R hip Pain Descriptors / Indicators: Grimacing;Guarding Pain Intervention(s): Limited activity within patient's tolerance;Monitored during session;Repositioned                                                          Frequency  Min 2X/week        Progress Toward Goals  OT Goals(current goals can now be found in the care plan section)  Progress towards OT goals: Progressing toward goals  Acute Rehab OT Goals Patient Stated Goal: decrease pain OT Goal Formulation: With patient/family Time For Goal Achievement: 05/13/17 Potential to Achieve Goals: Good  Plan Discharge plan remains appropriate                    AM-PAC  PT "6 Clicks" Daily Activity     Outcome Measure   Help from another person eating meals?: A Little Help from another person taking care of personal grooming?: A Little Help from another person toileting, which includes using toliet, bedpan, or urinal?: A Lot Help from another person bathing (including washing, rinsing, drying)?: A Lot Help from another person to put on and taking off regular upper body clothing?: A Lot Help from another person to put on and taking off regular lower body clothing?: A Lot 6 Click Score: 14    End of Session    OT Visit Diagnosis: Other abnormalities of gait and mobility (R26.89);Unsteadiness on feet (R26.81);Pain Pain - Right/Left: Right Pain - part of body: Hip   Activity Tolerance Patient tolerated treatment well   Patient Left in bed;with call bell/phone within reach;with family/visitor present   Nurse Communication Mobility status        Time: [id number] OT Time Calculation (min): 26 min  Charges: OT General Charges $OT Visit: 1 Visit OT Treatments $Therapeutic Activity: 8-22 mins  Marcy SirenBreanna Ladawna Walgren, OT Pager [phone] 05/02/2017    Orlando PennerBreanna L Cole [place] 05/02/2017, 5:04 PM

## 2017-05-03 DIAGNOSIS — S72001A Fracture of unspecified part of neck of right femur, initial encounter for closed fracture: Secondary | ICD-10-CM

## 2017-05-03 LAB — TYPE AND SCREEN
ABO/RH(D): O POS
ANTIBODY SCREEN: NEGATIVE
UNIT DIVISION: 0

## 2017-05-03 LAB — BPAM RBC
Blood Product Expiration Date: 201902072359
ISSUE DATE / TIME: 201901161258
UNIT TYPE AND RH: 5100

## 2017-05-03 NOTE — Social Work (Signed)
Clinical Social Worker facilitated patient discharge including contacting patient family and facility to confirm patient discharge plans.  Clinical information faxed to facility and family agreeable with plan.    CSW arranged ambulance transport via [facility] to [facility].    RN to call [phone] to give report prior to discharge.  Clinical Social Worker will sign off for now as social work intervention is no longer needed. Please consult us again if new need arises.  Darelle Kings, LCSW Clinical Social Worker [phone]      

## 2017-05-03 NOTE — Discharge Summary (Signed)
Physician Discharge Summary  Elijah Nixon MRN: [id number] DOB/AGE: [birth year] 82 y.o.  PCP: Judy Pimple, MD   Admit date: 04/28/2017 Discharge date: 05/03/2017  Discharge Diagnoses:    Active Problems:   Alcohol abuse   Essential hypertension   GEN OSTEOARTHROSIS INVOLVING MULTIPLE SITES   COLONIC POLYPS, HX OF   Seborrheic keratosis   BPH (benign prostatic hyperplasia)   Anemia   Grover's disease   S/P right hip fracture   Hyponatremia   Closed right hip fracture, initial encounter [name]    Follow-up recommendations Follow-up with PCP in 3-5 days , including all  additional recommended appointments as below Follow-up CBC, CMP in 3-5 days Follow-up CBC weekly      Allergies as of 05/02/2017      Reactions   Lisinopril    REACTION: increased potassium leveles      Medication List    STOP taking these medications   ibuprofen 200 MG tablet Commonly known as:  ADVIL,MOTRIN   PERCOCET 5-325 MG tablet Generic drug:  oxyCODONE-acetaminophen     TAKE these medications   acetaminophen 500 MG tablet Commonly known as:  TYLENOL Take 2 tablets (1,000 mg total) by mouth every 8 (eight) hours for 14 days.   amLODipine 10 MG tablet Commonly known as:  NORVASC TAKE 1 TABLET BY MOUTH DAILY   aspirin 81 MG tablet Take 81 mg by mouth daily.   b complex vitamins tablet Take 1 tablet by mouth daily.   Calcium Carbonate-Vit D-Min 600-400 MG-UNIT Tabs Take 1 tablet by mouth daily.   doxazosin 8 MG tablet Commonly known as:  CARDURA TAKE ONE (1) TABLET BY MOUTH EVERY DAY   enoxaparin 40 MG/0.4ML injection Commonly known as:  LOVENOX Inject 0.4 mLs (40 mg total) into the skin daily.   ferrous sulfate 325 (65 FE) MG tablet Take 1 tablet (325 mg total) by mouth 3 (three) times daily with meals.   folic acid 1 MG tablet Commonly known as:  FOLVITE Take 1 tablet (1 mg total) by mouth daily.   hydrOXYzine 25 MG tablet Commonly known as:  ATARAX/VISTARIL Take  0.5-1 tablets (12.5-25 mg total) by mouth at bedtime as needed for itching.   methocarbamol 500 MG tablet Commonly known as:  ROBAXIN Take 1 or 2 po Q 6hrs for muscle spasms or pain   nystatin powder Commonly known as:  MYCOSTATIN/NYSTOP Apply topically 3 (three) times daily.   oxyCODONE 5 MG immediate release tablet Commonly known as:  Oxy IR/ROXICODONE Take 1-2 pills every 6 hrs as needed for pain   pantoprazole 40 MG tablet Commonly known as:  PROTONIX Take 1 tablet (40 mg total) by mouth daily.   polyethylene glycol packet Commonly known as:  MIRALAX / GLYCOLAX Take 17 g by mouth daily.   potassium chloride SA 20 MEQ tablet Commonly known as:  K-DUR,KLOR-CON Take 2 tablets (40 mEq total) by mouth 2 (two) times daily.   PRESERVISION/LUTEIN Caps Take 1 capsule by mouth daily.   senna-docusate 8.6-50 MG tablet Commonly known as:  Senokot-S Take 1 tablet by mouth at bedtime.   spironolactone 25 MG tablet Commonly known as:  ALDACTONE Take 0.5 tablets (12.5 mg total) by mouth daily.   thiamine 100 MG tablet Take 1 tablet (100 mg total) by mouth daily.   VITAMIN B-12 PO Take 1 tablet by mouth daily.      Discharge Condition: Stable  Discharge Instructions Get Medicines reviewed and adjusted: Please take all your medications with you  for your next visit with your Primary MD  Please request your Primary MD to go over all Nixon tests and procedure/radiological results at the follow up, please ask your Primary MD to get all Nixon records sent to his/her office.  If you experience worsening of your admission symptoms, develop shortness of breath, life threatening emergency, suicidal or homicidal thoughts you must seek medical attention immediately by calling 911 or calling your MD immediately if symptoms less severe.  You must read complete instructions/literature along with all the possible adverse reactions/side effects for all the Medicines you take and that  have been prescribed to you. Take any new Medicines after you have completely understood and accpet all the possible adverse reactions/side effects.   Do not drive when taking Pain medications.   Do not take more than prescribed Pain, Sleep and Anxiety Medications  Special Instructions: If you have smoked or chewed Tobacco in the last 2 yrs please stop smoking, stop any regular Alcohol and or any Recreational drug use.  Wear Seat belts while driving.  Please note  You were cared for by a hospitalist during your Nixon stay. Once you are discharged, your primary care physician will handle any further medical issues. Please note that NO REFILLS for any discharge medications will be authorized once you are discharged, as it is imperative that you return to your primary care physician (or establish a relationship with a primary care physician if you do not have one) for your aftercare needs so that they can reassess your need for medications and monitor your lab values.  Discharge Instructions    Discharge instructions   Complete by:  As directed    Ramond Marrow MD, MPH [facility] [address], [address] [phone] (tel)   [phone] (fax)   POST-OPERATIVE INSTRUCTIONS   WOUND CARE  You may shower on Post-Op Day #3. Dressing can be removed and ok to shower and dry dressing can be placed as you see fit if there is any drainage.  EXERCISES Follow the instructions of your therapist.  No specific exercises necessary outside of this.  POST-OP MEDICINES A multi-modal approach will be used to treat your pain. Oxycodone - This is a strong narcotic, to be used only on an "as needed" basis for pain. Acetaminophen 500mg - A non-narcotic pain medicine.  Use 1000mg  three times a day for the first 14 days after surgery   Zofran 4mg  - This is an anti-nausea medicine to be used only if you are having nausea or vomitting.  FOLLOW-UP If you develop a Fever (<101.5),  Redness or Drainage from the surgical incision site, please call our office to arrange for an evaluation. Please call the office to schedule a follow-up appointment for your suture removal, 10-14 days post-operatively.  IF YOU HAVE ANY QUESTIONS, PLEASE FEEL FREE TO CALL OUR OFFICE.  HELPFUL INFORMATION You should wean off your narcotic medicines as soon as you are able.  Most patients will be off or using minimal narcotics before their first postop appointment.   We suggest you use the pain medication the first night prior to going to bed, in order to ease any pain when the anesthesia wears off. You should avoid taking pain medications on an empty stomach as it will make you nauseous.  Do not drink alcoholic beverages or take illicit drugs when taking pain medications.  Pain medication may make you constipated.  Below are a few solutions to try in this order: Decrease the amount of  pain medication if you aren't having pain. Drink lots of decaffeinated fluids. Drink prune juice and/or each dried prunes  If the first 3 don't work start with additional solutions Take Colace - an over-the-counter stool softener Take Senokot - an over-the-counter laxative Take Miralax - a stronger over-the-counter laxative       Allergies  Allergen Reactions  . Lisinopril     REACTION: increased potassium leveles      Disposition: SNF   Consults:  Orthopedics  Significant Diagnostic Studies:  Dg Chest 1 View  Result Date: 04/28/2017 CLINICAL DATA:  Hip fracture. EXAM: CHEST 1 VIEW COMPARISON:  February 15, 2017 FINDINGS: The heart, hila, and mediastinum are unremarkable. No pneumothorax. No nodules or masses. No focal infiltrates. IMPRESSION: No active disease. Electronically Signed   By: Gerome Samavid  Williams III M.D   On: 04/28/2017 10:03   Dg C-arm 1-60 Min  Result Date: 04/28/2017 CLINICAL DATA:  Operative fixation of a right femoral intertrochanteric fracture. EXAM: RIGHT FEMUR 2 VIEWS; DG  C-ARM 61-120 MIN COMPARISON:  Earlier today. FINDINGS: Eight C-arm views of the right femur demonstrate operative placement of and intramedullary rod and fixation screws bridging the previously demonstrated comminuted right femoral intertrochanteric fracture. The major fragments are in anatomic position and alignment on these views. IMPRESSION: Hardware fixation of the previously demonstrated right intertrochanteric fracture. Electronically Signed   By: Beckie SaltsSteven  Reid M.D.   On: 04/28/2017 12:08   Dg Hip Port Unilat With Pelvis 1v Right  Result Date: 04/28/2017 CLINICAL DATA:  Operative fixation of a comminuted right intertrochanteric fracture. EXAM: DG HIP (WITH OR WITHOUT PELVIS) 1V PORT RIGHT COMPARISON:  Earlier today. FINDINGS: Interval screw and rod fixation of the previously demonstrated comminuted intertrochanteric fracture of the right femur. Significantly improved position and alignment with essentially anatomic position and alignment of the major fragments. Lower lumbar spine degenerative changes. IMPRESSION: Operative fixation of the previously demonstrated right femoral intertrochanteric fracture. Electronically Signed   By: Beckie SaltsSteven  Reid M.D.   On: 04/28/2017 12:06   Dg Hip Unilat  With Pelvis 2-3 Views Right  Result Date: 04/28/2017 CLINICAL DATA:  Pain after fall EXAM: DG HIP (WITH OR WITHOUT PELVIS) 2-3V RIGHT COMPARISON:  None. FINDINGS: There is a comminuted displaced and angulated fracture in the right hip through the intertrochanteric region. No dislocation identified. No other fractures are seen. IMPRESSION: Comminuted displaced and angulated right intertrochanteric hip fracture. Electronically Signed   By: Gerome Samavid  Williams III M.D   On: 04/28/2017 07:31   Dg Femur, Min 2 Views Right  Result Date: 04/28/2017 CLINICAL DATA:  Operative fixation of a right femoral intertrochanteric fracture. EXAM: RIGHT FEMUR 2 VIEWS; DG C-ARM 61-120 MIN COMPARISON:  Earlier today. FINDINGS: Eight C-arm  views of the right femur demonstrate operative placement of and intramedullary rod and fixation screws bridging the previously demonstrated comminuted right femoral intertrochanteric fracture. The major fragments are in anatomic position and alignment on these views. IMPRESSION: Hardware fixation of the previously demonstrated right intertrochanteric fracture. Electronically Signed   By: Beckie SaltsSteven  Reid M.D.   On: 04/28/2017 12:08        Filed Weights   04/28/17 0602  Weight: 72.6 kg (160 lb)     Microbiology: No results found for this or any previous visit (from the past 240 hour(s)).     Blood Culture No results found for: SDES, SPECREQUEST, CULT, REPTSTATUS    Labs: Results for orders placed or performed during the Nixon encounter of 04/28/17 (from the past 48 hour(s))  Vitamin B12     Status: None   Collection Time: 05/01/17 11:49 AM  Result Value Ref Range   Vitamin B-12 622 180 - 914 pg/mL    Comment: (NOTE) This assay is not validated for testing neonatal or myeloproliferative syndrome specimens for Vitamin B12 levels.   Folate     Status: None   Collection Time: 05/01/17 11:49 AM  Result Value Ref Range   Folate 36.9 >5.9 ng/mL    Comment: RESULTS CONFIRMED BY MANUAL DILUTION  Iron and TIBC     Status: Abnormal   Collection Time: 05/01/17 11:49 AM  Result Value Ref Range   Iron 7 (L) 45 - 182 ug/dL   TIBC 960 (L) 454 - 098 ug/dL   Saturation Ratios 3 (L) 17.9 - 39.5 %   UIBC 239 ug/dL  Ferritin     Status: None   Collection Time: 05/01/17 11:49 AM  Result Value Ref Range   Ferritin 72 24 - 336 ng/mL  Reticulocytes     Status: Abnormal   Collection Time: 05/01/17 11:49 AM  Result Value Ref Range   Retic Ct Pct 0.9 0.4 - 3.1 %   RBC. 2.40 (L) 4.22 - 5.81 MIL/uL   Retic Count, Absolute 21.6 19.0 - 186.0 K/uL  CBC     Status: Abnormal   Collection Time: 05/02/17  5:18 AM  Result Value Ref Range   WBC 11.2 (H) 4.0 - 10.5 K/uL   RBC 2.14 (L) 4.22 - 5.81  MIL/uL   Hemoglobin 7.3 (L) 13.0 - 17.0 g/dL   HCT 11.9 (L) 14.7 - 82.9 %   MCV 96.7 78.0 - 100.0 fL   MCH 34.1 (H) 26.0 - 34.0 pg   MCHC 35.3 30.0 - 36.0 g/dL   RDW 56.2 13.0 - 86.5 %   Platelets 298 150 - 400 K/uL  Prepare RBC     Status: None   Collection Time: 05/02/17 11:00 AM  Result Value Ref Range   Order Confirmation ORDER PROCESSED BY BLOOD BANK   Type and screen Elijah Nixon     Status: None   Collection Time: 05/02/17 11:00 AM  Result Value Ref Range   ABO/RH(D) O POS    Antibody Screen NEG    Sample Expiration 05/05/2017    Unit Number [id number]    Blood Component Type RED CELLS,LR    Unit division 00    Status of Unit ISSUED,FINAL    Transfusion Status OK TO TRANSFUSE    Crossmatch Result Compatible   Hemoglobin and hematocrit, blood     Status: Abnormal   Collection Time: 05/02/17  7:06 PM  Result Value Ref Range   Hemoglobin 8.0 (L) 13.0 - 17.0 g/dL   HCT 32.4 (L) 40.1 - 02.7 %     Lipid Panel     Component Value Date/Time   CHOL 185 10/09/2016 0933   TRIG 33.0 10/09/2016 0933   HDL 110.60 10/09/2016 0933   CHOLHDL 2 10/09/2016 0933   VLDL 6.6 10/09/2016 0933   LDLCALC 68 10/09/2016 0933   LDLDIRECT 88.2 09/20/2012 0817     Lab Results  Component Value Date   HGBA1C 5.8 01/16/2017   HGBA1C 5.5 09/22/2013   HGBA1C 5.5 09/20/2012     Lab Results  Component Value Date   LDLCALC 68 10/09/2016   CREATININE 0.76 05/01/2017     HPI :  Elijah Nixon a 84 y.o.malewith medical history significant for HTN,alcohol abuse, arthritis, prior fall with fracture of  his left wrist and ribs,history of Grover's disease, brought to the emergency department after getting out of his bed, and suffering a mechanical fall, landing on his right hip. He denies any loss of consciousness, or hitting his head. He denies any vision changes or headaches. He denies any chest pain or palpitations. He denies any significant shortness of  breath, or productive cough. At times, he does not take deep breath, due to residual pain from his recent rib fracture, but this is not an acute or worsening issue. The patient drinks chronically about 10 beers a day, last on 04/27/2017 at 10 PM. He denies any tobacco or recreational drug use. Right hip x-ray showed comminuted displaced right intertrochanteric hip fracture. He underwent fixation 1/12 with Dr. Everardo Nixon.     Nixon COURSE:   Right hipfracturefollowinga mechanical fall -Underwent fixation 1/12 with Dr. Everardo Nixon -PT OT eval, SNF recommended ,  -WBAT -Lovenox for 2 weeks  for DVT prophylaxis -Follow up with Dr. Everardo Nixon 10-14 days   Slight fever postop without any focal signs of infection  Continue incentive spirometry    Alcohol withdrawal -Wife admits that patient drinks 6 pack beer every day for many years  -CIWA with Ativan per protocol -Thiamine, folate, and MVI  Post-op blood loss anemia on chronic iron deficiency anemia -In October 2018, his ferritin was low at 17.9. At that time, he had declined colonoscopy but underwent serial hemoccult cards which were negative.  -Will start iron supplementation with stool softeners -Hemoglobin 12.2 on admission, now 7.8>7.3 ,   anemia panel consistent with anemia of chronic disease with low iron levels Patient transfused 1 unit of packed red blood cells on 1/16, and post transfusion hemoglobin came up to 8.0 Started patient on a PPI given his alcohol history Patient did not have any signs and symptoms of active bleeding  Hypertension -Continue norvasc   Hyponatremia -Resolved after IVF   Hypokalemia -Replace, trend   Benign prostatic hypertrophy -ContinueCardura  Groin rash Continue to apply nystatin powder to the groin area 3 times a day avoid steroid creams in the  Groin area    Discharge Exam:   Blood pressure (!) 144/72, pulse (!) 103, temperature 98.8 F (37.1 C), temperature source Oral, resp.  rate 18, height 5\' 8"  (1.727 m), weight 72.6 kg (160 lb), SpO2 93 %.  General exam: Appears calm and comfortable  Respiratory system: Clear to auscultation. Respiratory effort normal. Cardiovascular system: S1 & S2 heard, RRR. No JVD, murmurs, rubs, gallops or clicks. No pedal edema. Gastrointestinal system: Abdomen is nondistended, soft and nontender. No organomegaly or masses felt. Normal bowel sounds heard. Central nervous system: Alert to voice but lethargic. +Bilateral tremors of hands  Extremities: Symmetric Skin: No rashes, lesions or ulcers       Contact information for follow-up providers    Tower, Audrie Gallus, MD. Call.   Specialties:  Family Medicine, Radiology Why:  Nixon follow-up in 3-5 days Contact information: [address] [place] [address] [phone]        Bjorn Pippin, MD. Call in 2 week(s).   Specialty:  Orthopedic Surgery Contact information: [address] [place] [address] [phone]            Contact information for after-discharge care    Destination    [facility] .   Service:  Skilled Paramedic information: [address] [place] [address] [phone]  SignedRicharda Overlie 05/03/2017, 9:04 AM        Time spent >1 hour

## 2017-05-26 ENCOUNTER — Telehealth: Payer: Self-pay | Admitting: Family Medicine

## 2017-05-26 NOTE — Telephone Encounter (Signed)
Please ok those verbal orders  

## 2017-05-26 NOTE — Telephone Encounter (Signed)
Took phone call from on call team and spoke to physical therapist from [facility]; pt was recently discharged from rehab facility due to fall and hip fracture hospitalization (d/cd from hospital 1/17); she would like to continue PT twice/week x 4 weeks. Verbal ordered given.

## 2017-05-28 ENCOUNTER — Telehealth: Payer: Self-pay | Admitting: Family Medicine

## 2017-05-28 NOTE — Telephone Encounter (Signed)
Please verbally ok that order  

## 2017-05-28 NOTE — Telephone Encounter (Signed)
Verbal orders given to Will.  

## 2017-05-28 NOTE — Telephone Encounter (Signed)
Copied from CRM [phone]#[id number]. Topic: General - Other >> May 28, 2017  4:30 PM Stephannie LiSimmons, [place]ah [place] L, NT wrote: Reason for CRM:  Occupation therapy called and needs verbal order for the patient  2 x for 3 weeks ,  [phone] Will Schalla

## 2017-05-28 NOTE — Telephone Encounter (Signed)
PLEASE NOTE: All timestamps contained within this report are represented as [place] Standard Time. CONFIDENTIALTY NOTICE: This fax transmission is intended only for the addressee. It contains information that is legally privileged, confidential or otherwise protected from use or disclosure. If you are not the intended recipient, you are strictly prohibited from reviewing, disclosing, copying using or disseminating any of this information or taking any action in reliance on or regarding this information. If you have received this fax in error, please notify us immediately by telephone so that we can arrange for its return to us. Phone: [phone], Toll-Free: [phone], Fax: [phone] Page: 1 of 1 Call Id: [id number] [facility] - Client Nonclinical Telephone Record [facility] Client [facility] - [facility]  [facility] Physician Tower, Idamae SchullerMarne - MD Contact Type Call Call Type Home Care Hospice Page Now Who Is Calling Home Health / Hospice Agency Caller Name Kenn FileBetsy Rolfes Facility Name [facility] Facility Number [phone] Patient Name Gatha MayerBilly Gravely Patient DOB 06-11-1932 Reason for Call Request to speak to Physician Initial Comment Caller states Tamela OddiBetsy, PT w/[facility], [phone] pt was d/c and needs verbal orders; Additional Comment needs verbal order to continue PT 2x per week for 4 wks; Paging DoctorName Phone DateTime Result/Outcome Message Type Notes Asencion Partridgendy, Camille- MD [phone] 05/26/2017 10:52:44 AM Called On Call Provider - Left Message Doctor Paged Asencion Partridgendy, Camille- MD [phone] 05/26/2017 11:12:48 AM Called On Call Provider - Reached Doctor Paged Asencion PartridgeAndy, Camille- MD 05/26/2017 11:12:53 AM Spoke with On Call - General Message Result Call Closed By: Loree FeeMelissa Smith Transaction Date/Time: 05/26/2017 10:36:27 AM (ET)

## 2017-05-28 NOTE — Telephone Encounter (Signed)
Left detailed message on [name]'s vm giving v/o as requested.

## 2017-05-30 ENCOUNTER — Ambulatory Visit: Payer: Medicare Other | Admitting: Family Medicine

## 2017-05-30 ENCOUNTER — Encounter: Payer: Self-pay | Admitting: Family Medicine

## 2017-05-30 VITALS — BP 120/54 | HR 91 | Temp 96.1°F | Ht 65.0 in | Wt 138.5 lb

## 2017-05-30 DIAGNOSIS — D500 Iron deficiency anemia secondary to blood loss (chronic): Secondary | ICD-10-CM

## 2017-05-30 DIAGNOSIS — M810 Age-related osteoporosis without current pathological fracture: Secondary | ICD-10-CM

## 2017-05-30 DIAGNOSIS — L111 Transient acantholytic dermatosis [Grover]: Secondary | ICD-10-CM

## 2017-05-30 DIAGNOSIS — Z8781 Personal history of (healed) traumatic fracture: Secondary | ICD-10-CM

## 2017-05-30 DIAGNOSIS — F1010 Alcohol abuse, uncomplicated: Secondary | ICD-10-CM

## 2017-05-30 DIAGNOSIS — I10 Essential (primary) hypertension: Secondary | ICD-10-CM | POA: Diagnosis not present

## 2017-05-30 DIAGNOSIS — E871 Hypo-osmolality and hyponatremia: Secondary | ICD-10-CM | POA: Diagnosis not present

## 2017-05-30 NOTE — Progress Notes (Signed)
Subjective:    Patient ID: Elijah Nixon, male    DOB: 11-26-1932, 84 y.o.   MRN: [id number]  HPI Here for f/u of hosp and rehab stay   He was hosp 1/12 to 04/1717 For closed right hip fracture (a fall getting out of bed)  Commuted displaced R intertrochanteric hip fracture- underwent fixation 1/12 with Dr Everardo Pacific  Addressed alcoholism as well (10 beer a day drinker) - was given ativan per protocol along with thiamine/folate and mvi and d/c with PPI He was d/c to SNF for rehabilitation Had lovenox for 2 wk fo rdvt prophylaxis   Noted to have anemia of chronic disease and low iron levels  Was tranfused 1 u of prbc on 1/16  Post trans hb 8.0  Groin rash treated with nystatin powder   Wt Readings from Last 3 Encounters:  05/30/17 138 lb 8 oz (62.8 kg)  04/28/17 160 lb (72.6 kg)  02/15/17 150 lb (68 kg)   23.05 kg/m   BP Readings from Last 3 Encounters:  05/30/17 (!) 120/54  05/03/17 (!) 144/72  02/15/17 (!) 151/72   Pulse Readings from Last 3 Encounters:  05/30/17 91  05/03/17 (!) 103  02/15/17 84      Chemistry      Component Value Date/Time   NA 132 (L) 05/01/2017 0631   K 3.4 (L) 05/01/2017 0631   CL 103 05/01/2017 0631   CO2 21 (L) 05/01/2017 0631   BUN 7 05/01/2017 0631   CREATININE 0.76 05/01/2017 0631      Component Value Date/Time   CALCIUM 7.8 (L) 05/01/2017 0631   ALKPHOS 73 04/28/2017 0632   AST 24 04/28/2017 0632   ALT 22 04/28/2017 0632   BILITOT 0.7 04/28/2017 7829      Lab Results  Component Value Date   WBC 11.2 (H) 05/02/2017   HGB 8.0 (L) 05/02/2017   HCT 23.5 (L) 05/02/2017   MCV 96.7 05/02/2017   PLT 298 05/02/2017    Lab Results  Component Value Date   FERRITIN 72 05/01/2017   Lab Results  Component Value Date   IRON 7 (L) 05/01/2017   TIBC 246 (L) 05/01/2017   FERRITIN 72 05/01/2017    Lab Results  Component Value Date   VITAMINB12 622 05/01/2017   Is home now  PT came in for the first time yesterday Doing  balance/gait training  Using a walker  Has taken up all the rugs   Taking iron three times per day   Sees ortho on 2/22  He is cleared for weight bearing as tolerated   Has had 4 beers since he got home  Wife has charge of it  Does not crave it but he likes it   Denies symptoms of depression  Thinks he got through this very well    Patient Active Problem List   Diagnosis Date Noted  . S/P right hip fracture 04/28/2017  . Hyponatremia 04/28/2017  . Prediabetes 10/17/2016  . Anemia 10/17/2016  . 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 BPH (benign prostatic hyperplasia) 10/07/2016  . Rash and nonspecific skin eruption 08/30/2016  . Lumbar disc disease 05/06/2015  . Routine general medical examination at a health care facility 10/02/2014  . Colon cancer screening 09/29/2013  . Encounter for Medicare annual wellness exam 09/27/2012  . Seborrheic keratosis 03/29/2012  . SWELLING, MASS, OR LUMP IN CHEST 06/24/2009  . ERECTILE DYSFUNCTION 02/16/2009  . ACTINIC KERATOSIS 02/16/2009  . ADVERSE DRUG REACTION 11/13/2008  . Alcohol  abuse 10/27/2008  . Essential hypertension 10/27/2008  . GEN OSTEOARTHROSIS INVOLVING MULTIPLE SITES 10/27/2008  . Osteoporosis 10/27/2008  . COLONIC POLYPS, HX OF 10/27/2008  . DIVERTICULITIS, HX OF 10/27/2008   Past Medical History:  Diagnosis Date  . AA (alcohol abuse)    at least 8-10 drinks/day  . Arthritis    OA  . DDD (degenerative disc disease)   . ED (erectile dysfunction)   . Grover's disease   . History of colon polyps   . History of diverticulitis of colon   . Hypertension   . Macular degeneration    dry  . Osteoporosis    spinal compression fx   Past Surgical History:  Procedure Laterality Date  . BACK SURGERY    . INTRAMEDULLARY (IM) NAIL INTERTROCHANTERIC Right 04/28/2017   Procedure: INTRAMEDULLARY (IM) NAIL INTERTROCHANTRIC;  Surgeon: Bjorn Pippin, MD;  Location: [facility] OR;  Service: Orthopedics;  Laterality: Right;  . SPINE SURGERY   1971   deg disc disease x 2   Social History   Tobacco Use  . Smoking status: Never Smoker  . Smokeless tobacco: Never Used  Substance Use Topics  . Alcohol use: Yes    Alcohol/week: 6.0 oz    Types: 10 Cans of beer per week    Comment: 10 cans throughout the day   . Drug use: No   Family History  Problem Relation Age of Onset  . Diabetes Son    Allergies  Allergen Reactions  . Lisinopril     REACTION: increased potassium leveles   Current Outpatient Medications on File Prior to Visit  Medication Sig Dispense Refill  . amLODipine (NORVASC) 10 MG tablet TAKE 1 TABLET BY MOUTH DAILY 90 tablet 3  . aspirin 81 MG tablet Take 81 mg by mouth daily.      Marland Kitchen b complex vitamins tablet Take 1 tablet by mouth daily.    . Calcium Carbonate-Vit D-Min 600-400 MG-UNIT TABS Take 1 tablet by mouth daily.     . Cyanocobalamin (VITAMIN B-12 PO) Take 1 tablet by mouth daily.    Marland Kitchen doxazosin (CARDURA) 8 MG tablet TAKE ONE (1) TABLET BY MOUTH EVERY DAY 90 tablet 3  . ferrous sulfate 325 (65 FE) MG tablet Take 1 tablet (325 mg total) by mouth 3 (three) times daily with meals. 60 tablet 3  . folic acid (FOLVITE) 1 MG tablet Take 1 tablet (1 mg total) by mouth daily. 30 tablet 2  . Multiple Vitamins-Minerals (PRESERVISION/LUTEIN) CAPS Take 1 capsule by mouth daily.      Marland Kitchen nystatin (MYCOSTATIN/NYSTOP) powder Apply topically 3 (three) times daily. 15 g 0  . pantoprazole (PROTONIX) 40 MG tablet Take 1 tablet (40 mg total) by mouth daily. 30 tablet 1  . potassium chloride SA (K-DUR,KLOR-CON) 20 MEQ tablet Take 2 tablets (40 mEq total) by mouth 2 (two) times daily. 30 tablet 1  . spironolactone (ALDACTONE) 25 MG tablet Take 0.5 tablets (12.5 mg total) by mouth daily. 90 tablet 3   No current facility-administered medications on file prior to visit.     Review of Systems  Constitutional: Positive for appetite change and fatigue. Negative for activity change, fever and unexpected weight change.       Pos  for wt loss after hip surgery and etoh cessation   HENT: Negative for congestion, rhinorrhea, sore throat and trouble swallowing.   Eyes: Negative for pain, redness, itching and visual disturbance.  Respiratory: Negative for cough, chest tightness, shortness of breath and wheezing.  Cardiovascular: Negative for chest pain and palpitations.  Gastrointestinal: Negative for abdominal pain, blood in stool, constipation, diarrhea and nausea.       Neg for heartburn   Endocrine: Negative for cold intolerance, heat intolerance, polydipsia and polyuria.  Genitourinary: Negative for difficulty urinating, dysuria, frequency and urgency.  Musculoskeletal: Positive for arthralgias. Negative for joint swelling and myalgias.       Pos for R hip pain s/o fracture and surgery   Skin: Negative for pallor and rash.  Neurological: Negative for dizziness, tremors, weakness, numbness and headaches.  Hematological: Negative for adenopathy. Does not bruise/bleed easily.  Psychiatric/Behavioral: Negative for decreased concentration, dysphoric mood and sleep disturbance. The patient is not nervous/anxious.        Objective:   Physical Exam  Constitutional: He appears well-developed and well-nourished. No distress.  Frail appearing elderly male sitting in chair (uses walker to ambulate)   HENT:  Head: Normocephalic and atraumatic.  Mouth/Throat: Oropharynx is clear and moist.  Eyes: Conjunctivae and EOM are normal. Pupils are equal, round, and reactive to light. Right eye exhibits no discharge. Left eye exhibits no discharge. No scleral icterus.  Neck: Normal range of motion. Neck supple. No JVD present. Carotid bruit is not present. No thyromegaly present.  Cardiovascular: Normal rate, regular rhythm, normal heart sounds and intact distal pulses. Exam reveals no gallop.  Pulmonary/Chest: Effort normal and breath sounds normal. No respiratory distress. He has no wheezes. He has no rales.  No crackles    Abdominal: Soft. Bowel sounds are normal. He exhibits no distension, no abdominal bruit and no mass. There is no tenderness.  Musculoskeletal: He exhibits no edema.  Limited rom and wt bearing on R hip (s/p fracture)  Waking with walker   Lymphadenopathy:    He has no cervical adenopathy.  Neurological: He is alert. He has normal reflexes. He displays tremor.  Hand tremor is improved from last visit   Skin: Skin is warm and dry. No rash noted. No pallor.  Rash of Grover's dz present on trunk and extremities   Psychiatric: His mood appears anxious. His affect is not blunt and not labile. His speech is delayed. Thought content is not paranoid. He does not exhibit a depressed mood.  Mildly slowed cognition  Seems anxious (but denies it) Supportive wife is present           Assessment & Plan:   Problem List Items Addressed This Visit      Cardiovascular and Mediastinum   Essential hypertension    bp in fair control at this time  BP Readings from Last 1 Encounters:  05/30/17 (!) 120/54   No changes needed Disc lifstyle change with low sodium diet and exercise  Stable in the hospital with recent hip fx surgery Continues amlodipine Adv total etoh cessation  Lab today  Continue to follow       Relevant Orders   CBC with Differential/Platelet   Comprehensive metabolic panel     Musculoskeletal and Integument   Grover's disease    This rash flared during hip fx/surgery hosp Now improving       Osteoporosis    Has now had fall with hip and rib fractures  Counseled on etoh cessation as well as ca and D intake  Declines dexa but will re discuss after rehab from hip surgery  Check D level today          Other   Alcohol abuse    Long disc with pt and wife  I feel etoh has played a large role in falls/ OP/hip and rib fx/ cognitive decline/ tremor / anemia of chronic dz  Frankly advised again he quit etoh entirely  Wife plans to get it out of the house  He is done  with w/d  (Reviewed hospital records, lab results and studies in detail ) Lab today for cbc and lytes  Strongly recommend AA or other 12 step program Pt states he wil look into it        Relevant Orders   VITAMIN D 25 Hydroxy (Vit-D Deficiency, Fractures)   Anemia    Suspect anemia of chronic dz along with iron def worsened by recent hip surg Stool cards were neg (he declines colonoscopy) Reviewed hospital records, lab results and studies in detail   On ppi  Counseled to abstain from etoh  On ferrous sulfate tid now  Lab today for cbc and ferritin  On B vit and folate as well       Relevant Orders   CBC with Differential/Platelet   Ferritin   Hyponatremia    Low in hospital   (Reviewed hospital records, lab results and studies in detail  ) Re check today  Much less etoh and plans to quit  Eating better at home        Relevant Orders   CBC with Differential/Platelet   Comprehensive metabolic panel   S/P right hip fracture - Primary    Making steady progress after surgical repair  Reviewed hospital records, lab results and studies in detail   Now home from rehab and making steady progress with PT  Enc to continue using walker Also doing gait /balance therapy to prevent falls  Disc fall prev in the home  I would like to re disc OP tx once fully healed

## 2017-05-30 NOTE — Assessment & Plan Note (Signed)
Low in hospital   (Reviewed hospital records, lab results and studies in detail  ) Re check today  Much less etoh and plans to quit  Eating better at home

## 2017-05-30 NOTE — Assessment & Plan Note (Signed)
Has now had fall with hip and rib fractures  Counseled on etoh cessation as well as ca and D intake  Declines dexa but will re discuss after rehab from hip surgery  Check D level today

## 2017-05-30 NOTE — Assessment & Plan Note (Signed)
Suspect anemia of chronic dz along with iron def worsened by recent hip surg Stool cards were neg (he declines colonoscopy) Reviewed hospital records, lab results and studies in detail   On ppi  Counseled to abstain from etoh  On ferrous sulfate tid now  Lab today for cbc and ferritin  On B vit and folate as well

## 2017-05-30 NOTE — Assessment & Plan Note (Signed)
Long disc with pt and wife  I feel etoh has played a large role in falls/ OP/hip and rib fx/ cognitive decline/ tremor / anemia of chronic dz  Frankly advised again he quit etoh entirely  Wife plans to get it out of the house  He is done with w/d  (Reviewed hospital records, lab results and studies in detail ) Lab today for cbc and lytes  Strongly recommend AA or other 12 step program Pt states he wil look into it

## 2017-05-30 NOTE — Patient Instructions (Addendum)
Keep doing physical therapy for strength and balance   Abstain from alcohol completely - I highly recommend a 12 step program like AA if you want to pursue that   Labs today - electrolytes/ vitamin D level and checking anemia and iron   Keep up the good work   Continue current medicines for now  Eat 3 meals per day with protein

## 2017-05-30 NOTE — Assessment & Plan Note (Signed)
bp in fair control at this time  BP Readings from Last 1 Encounters:  05/30/17 (!) 120/54   No changes needed Disc lifstyle change with low sodium diet and exercise  Stable in the hospital with recent hip fx surgery Continues amlodipine Adv total etoh cessation  Lab today  Continue to follow

## 2017-05-30 NOTE — Assessment & Plan Note (Signed)
Making steady progress after surgical repair  Reviewed hospital records, lab results and studies in detail   Now home from rehab and making steady progress with PT  Enc to continue using walker Also doing gait /balance therapy to prevent falls  Disc fall prev in the home  I would like to re disc OP tx once fully healed

## 2017-05-30 NOTE — Assessment & Plan Note (Signed)
This rash flared during hip fx/surgery hosp Now improving

## 2017-05-31 LAB — COMPREHENSIVE METABOLIC PANEL
ALBUMIN: 3.4 g/dL — AB (ref 3.5–5.2)
ALT: 19 U/L (ref 0–53)
AST: 20 U/L (ref 0–37)
Alkaline Phosphatase: 231 U/L — ABNORMAL HIGH (ref 39–117)
BUN: 17 mg/dL (ref 6–23)
CALCIUM: 9 mg/dL (ref 8.4–10.5)
CHLORIDE: 105 meq/L (ref 96–112)
CO2: 23 meq/L (ref 19–32)
Creatinine, Ser: 0.87 mg/dL (ref 0.40–1.50)
GFR: 89.19 mL/min (ref >60.00)
Glucose, Bld: 107 mg/dL — ABNORMAL HIGH (ref 70–99)
POTASSIUM: 5 meq/L (ref 3.5–5.1)
SODIUM: 135 meq/L (ref 135–145)
Total Bilirubin: 0.3 mg/dL (ref 0.2–1.2)
Total Protein: 6.6 g/dL (ref 6.0–8.3)

## 2017-05-31 LAB — CBC WITH DIFFERENTIAL/PLATELET
BASOS PCT: 0.6 % (ref 0.0–3.0)
Basophils Absolute: 0.1 10*3/uL (ref 0.0–0.1)
EOS PCT: 21.7 % — AB (ref 0.0–5.0)
Eosinophils Absolute: 2.2 10*3/uL — ABNORMAL HIGH (ref 0.0–0.7)
HCT: 31.8 % — ABNORMAL LOW (ref 39.0–52.0)
HEMOGLOBIN: 11.4 g/dL — AB (ref 13.0–17.0)
Lymphocytes Relative: 9.3 % — ABNORMAL LOW (ref 12.0–46.0)
Lymphs Abs: 0.9 10*3/uL (ref 0.7–4.0)
MCHC: 35.8 g/dL (ref 30.0–36.0)
MCV: 100.1 fl — ABNORMAL HIGH (ref 78.0–100.0)
MONO ABS: 0.5 10*3/uL (ref 0.1–1.0)
Monocytes Relative: 5.3 % (ref 3.0–12.0)
NEUTROS ABS: 6.3 10*3/uL (ref 1.4–7.7)
Neutrophils Relative %: 63.1 % (ref 43.0–77.0)
PLATELETS: 486 10*3/uL — AB (ref 150.0–400.0)
RBC: 3.17 Mil/uL — ABNORMAL LOW (ref 4.22–5.81)
RDW: 15.3 % (ref 11.5–15.5)
WBC: 10 10*3/uL (ref 4.0–10.5)

## 2017-05-31 LAB — FERRITIN: FERRITIN: 193.8 ng/mL (ref 22.0–322.0)

## 2017-05-31 LAB — VITAMIN D 25 HYDROXY (VIT D DEFICIENCY, FRACTURES): VITD: 66.74 ng/mL (ref 30.00–100.00)

## 2017-06-01 ENCOUNTER — Telehealth: Payer: Self-pay

## 2017-06-01 DIAGNOSIS — D509 Iron deficiency anemia, unspecified: Secondary | ICD-10-CM

## 2017-06-01 NOTE — Telephone Encounter (Signed)
Tower, Audrie GallusMarne A, MD  [place] AFB[place], Shapale M, CMA        Released on mychart  I want to re check a cbc in about 6 weeks for anemia  Please call him to schedule this   I called and LMOVM for him to call office to discuss labs and that we need to repeat one lab in 6 weeks/future order placed/thx dmf

## 2017-06-05 DIAGNOSIS — F1010 Alcohol abuse, uncomplicated: Secondary | ICD-10-CM | POA: Diagnosis not present

## 2017-06-05 DIAGNOSIS — M150 Primary generalized (osteo)arthritis: Secondary | ICD-10-CM | POA: Diagnosis not present

## 2017-06-05 DIAGNOSIS — S72141D Displaced intertrochanteric fracture of right femur, subsequent encounter for closed fracture with routine healing: Secondary | ICD-10-CM | POA: Diagnosis not present

## 2017-06-05 DIAGNOSIS — I10 Essential (primary) hypertension: Secondary | ICD-10-CM | POA: Diagnosis not present

## 2017-06-05 DIAGNOSIS — Z9181 History of falling: Secondary | ICD-10-CM | POA: Diagnosis not present

## 2017-06-05 DIAGNOSIS — D649 Anemia, unspecified: Secondary | ICD-10-CM | POA: Diagnosis not present

## 2017-06-05 DIAGNOSIS — Z0279 Encounter for issue of other medical certificate: Secondary | ICD-10-CM | POA: Diagnosis not present

## 2017-06-09 ENCOUNTER — Emergency Department (HOSPITAL_COMMUNITY): Payer: Medicare Other

## 2017-06-09 ENCOUNTER — Other Ambulatory Visit: Payer: Self-pay

## 2017-06-09 ENCOUNTER — Observation Stay (HOSPITAL_COMMUNITY)
Admission: EM | Admit: 2017-06-09 | Discharge: 2017-06-10 | Disposition: A | Discharge status: Home / Self Care | Payer: Medicare Other | Attending: Internal Medicine | Admitting: Internal Medicine

## 2017-06-09 ENCOUNTER — Encounter (HOSPITAL_COMMUNITY): Payer: Self-pay

## 2017-06-09 DIAGNOSIS — D638 Anemia in other chronic diseases classified elsewhere: Secondary | ICD-10-CM | POA: Insufficient documentation

## 2017-06-09 DIAGNOSIS — I110 Hypertensive heart disease with heart failure: Secondary | ICD-10-CM | POA: Diagnosis not present

## 2017-06-09 DIAGNOSIS — L111 Transient acantholytic dermatosis [Grover]: Secondary | ICD-10-CM | POA: Diagnosis not present

## 2017-06-09 DIAGNOSIS — W1830XA Fall on same level, unspecified, initial encounter: Secondary | ICD-10-CM | POA: Diagnosis not present

## 2017-06-09 DIAGNOSIS — I2510 Atherosclerotic heart disease of native coronary artery without angina pectoris: Secondary | ICD-10-CM | POA: Diagnosis not present

## 2017-06-09 DIAGNOSIS — D649 Anemia, unspecified: Secondary | ICD-10-CM | POA: Diagnosis present

## 2017-06-09 DIAGNOSIS — N400 Enlarged prostate without lower urinary tract symptoms: Secondary | ICD-10-CM | POA: Diagnosis not present

## 2017-06-09 DIAGNOSIS — R000 Tachycardia, unspecified: Secondary | ICD-10-CM | POA: Diagnosis not present

## 2017-06-09 DIAGNOSIS — D509 Iron deficiency anemia, unspecified: Secondary | ICD-10-CM | POA: Insufficient documentation

## 2017-06-09 DIAGNOSIS — Z9889 Other specified postprocedural states: Secondary | ICD-10-CM | POA: Insufficient documentation

## 2017-06-09 DIAGNOSIS — F1020 Alcohol dependence, uncomplicated: Secondary | ICD-10-CM | POA: Insufficient documentation

## 2017-06-09 DIAGNOSIS — Z7982 Long term (current) use of aspirin: Secondary | ICD-10-CM | POA: Diagnosis not present

## 2017-06-09 DIAGNOSIS — S79911A Unspecified injury of right hip, initial encounter: Secondary | ICD-10-CM

## 2017-06-09 DIAGNOSIS — S50312A Abrasion of left elbow, initial encounter: Principal | ICD-10-CM | POA: Insufficient documentation

## 2017-06-09 DIAGNOSIS — Z79899 Other long term (current) drug therapy: Secondary | ICD-10-CM | POA: Diagnosis not present

## 2017-06-09 DIAGNOSIS — I509 Heart failure, unspecified: Secondary | ICD-10-CM | POA: Diagnosis not present

## 2017-06-09 DIAGNOSIS — K219 Gastro-esophageal reflux disease without esophagitis: Secondary | ICD-10-CM | POA: Insufficient documentation

## 2017-06-09 DIAGNOSIS — D72829 Elevated white blood cell count, unspecified: Secondary | ICD-10-CM | POA: Diagnosis not present

## 2017-06-09 DIAGNOSIS — M25551 Pain in right hip: Secondary | ICD-10-CM | POA: Diagnosis present

## 2017-06-09 DIAGNOSIS — W19XXXA Unspecified fall, initial encounter: Secondary | ICD-10-CM

## 2017-06-09 DIAGNOSIS — W19XXXS Unspecified fall, sequela: Secondary | ICD-10-CM | POA: Diagnosis not present

## 2017-06-09 LAB — COMPREHENSIVE METABOLIC PANEL
ALBUMIN: 2.9 g/dL — AB (ref 3.5–5.0)
ALT: 24 U/L (ref 17–63)
ANION GAP: 11 (ref 5–15)
AST: 27 U/L (ref 15–41)
Alkaline Phosphatase: 167 U/L — ABNORMAL HIGH (ref 38–126)
BILIRUBIN TOTAL: 0.3 mg/dL (ref 0.3–1.2)
BUN: 20 mg/dL (ref 6–20)
CO2: 20 mmol/L — ABNORMAL LOW (ref 22–32)
Calcium: 8.5 mg/dL — ABNORMAL LOW (ref 8.9–10.3)
Chloride: 107 mmol/L (ref 101–111)
Creatinine, Ser: 0.88 mg/dL (ref 0.61–1.24)
GFR calc Af Amer: >60 mL/min (ref >60)
GFR calc non Af Amer: >60 mL/min (ref >60)
GLUCOSE: 103 mg/dL — AB (ref 65–99)
POTASSIUM: 4.1 mmol/L (ref 3.5–5.1)
Sodium: 138 mmol/L (ref 135–145)
TOTAL PROTEIN: 6 g/dL — AB (ref 6.5–8.1)

## 2017-06-09 LAB — CBC WITH DIFFERENTIAL/PLATELET
BASOS PCT: 0 %
Basophils Absolute: 0 10*3/uL (ref 0.0–0.1)
EOS ABS: 2.8 10*3/uL — AB (ref 0.0–0.7)
EOS PCT: 20 %
HEMATOCRIT: 34.4 % — AB (ref 39.0–52.0)
Hemoglobin: 11.3 g/dL — ABNORMAL LOW (ref 13.0–17.0)
Lymphocytes Relative: 13 %
Lymphs Abs: 1.8 10*3/uL (ref 0.7–4.0)
MCH: 31.8 pg (ref 26.0–34.0)
MCHC: 32.8 g/dL (ref 30.0–36.0)
MCV: 96.9 fL (ref 78.0–100.0)
MONO ABS: 0.7 10*3/uL (ref 0.1–1.0)
Monocytes Relative: 5 %
Neutro Abs: 8.6 10*3/uL — ABNORMAL HIGH (ref 1.7–7.7)
Neutrophils Relative %: 62 %
PLATELETS: 413 10*3/uL — AB (ref 150–400)
RBC: 3.55 MIL/uL — ABNORMAL LOW (ref 4.22–5.81)
RDW: 16.1 % — AB (ref 11.5–15.5)
WBC: 13.9 10*3/uL — ABNORMAL HIGH (ref 4.0–10.5)

## 2017-06-09 LAB — PROTIME-INR
INR: 1.03
PROTHROMBIN TIME: 13.4 s (ref 11.4–15.2)

## 2017-06-09 LAB — TYPE AND SCREEN
ABO/RH(D): O POS
Antibody Screen: NEGATIVE

## 2017-06-09 MED ORDER — MORPHINE SULFATE (PF) 4 MG/ML IV SOLN
4.0000 mg | Freq: Once | INTRAVENOUS | Status: AC
  Administered 2017-06-09: 4 mg via INTRAVENOUS
  Filled 2017-06-09: qty 1

## 2017-06-09 NOTE — Progress Notes (Signed)
Received call from Dr. Hyacinth MeekerMiller about this patient. He underwent cephalomedullary nailing of intertrochanteric femur fracture with Dr. [name] on 04/28/17. He tripped and fell earlier today and had increased pain and difficulty with weight bearing. X-rays show distal interlocking screw broken. There is some mild shortening at the fracture which is appropriate and theres notable callus. Recommend CT scan proximally to make sure there is no hardware failure. Would continue WBAT RLE with plan for discharge home if patient is able to mobilize vs admission to hospitalists for pain control and mobilization with therapy. No indication for further surgery at this point.  Roby LoftsKevin P. Mayrani Khamis, MD [facility] [phone] (phone)

## 2017-06-09 NOTE — ED Triage Notes (Signed)
Pt brought in by [facility] from home for a fall around 1600 today. Pt states he tripped walked from his chair over to his sofa. Pt denies LOC, head/neck/back pain. Per pt he does not take any blood thinners. Pt has surgery on his right hip d/t a fracture x1 month ago. Pt states pain is located where the rod was placed.

## 2017-06-09 NOTE — H&P (Signed)
TRH H&P   Patient Demographics:    Elijah Nixon, is a 84 y.o. male  MRN: [id number]   DOB - November 24, 1932  Admit Date - 06/09/2017  Outpatient Primary MD for the patient is Tower, Wynelle Fanny, MD  Referring MD/NP/PA:   Noemi Chapel   Outpatient Specialists:   Patient coming from: home  Chief Complaint  Patient presents with  . Fall      HPI:    Elijah Nixon  is a 84 y.o. male, w hypertension, recent IM nail 04/28/2017, apparently had fall today.  It was a mechanical fall  "just tripped while getting out of lounge chair" pt denies dizziness, syncope, cp, palp, sob n/v, diarrhea, brbpr, dysuria, hematuria.  Pt presented to ED due to hip pain.    In ED,   CT scan right hip IMPRESSION: 1. Status post intramedullary rodding of comminuted intertrochanteric fracture with displaced lesser trochanteric fracture fragment. Callus is forming at the fracture site. There appears to be slightly more lateral positioning of the rod traversing the femoral head and neck with the lateral aspect protruding into the soft tissues, compared to prior radiographs. There appears to be some increased foreshortening/impaction at the femoral neck compared to the postoperative radiograph. Surgical hardware otherwise appears intact and without surrounding lucency.  Wbc 13.9, Hgb 11.3, Plt 413 Na 138, K 4.1, Bun 20, Creatine 0.88 Alb 2.9 Ast 27, Alt 24 Alk phos 167 T. Bili 0.3  INR 1.03  ED consulted with orthopedics who per PA Nehemiah Settle), apparently states no surgical intervention required.    ED requests admission for pain control and physical therapy        Review of systems:    In addition to the HPI above,   No Fever-chills, No Headache, No changes with Vision or hearing, No problems swallowing food or Liquids, No Chest pain, Cough or Shortness of Breath, No Abdominal pain, No  Nausea or Vommitting, Bowel movements are regular, No Blood in stool or Urine, No dysuria, No new skin rashes or bruises,  No new weakness, tingling, numbness in any extremity, No recent weight gain or loss, No polyuria, polydypsia or polyphagia, No significant Mental Stressors.  A full 10 point Review of Systems was done, except as stated above, all other Review of Systems were negative.   With Past History of the following :    Past Medical History:  Diagnosis Date  . AA (alcohol abuse)    at least 8-10 drinks/day  . Arthritis    OA  . DDD (degenerative disc disease)   . ED (erectile dysfunction)   . Grover's disease   . History of colon polyps   . History of diverticulitis of colon   . Hypertension   . Macular degeneration    dry  . Osteoporosis    spinal compression fx      Past Surgical History:  Procedure Laterality  Date  . BACK SURGERY    . INTRAMEDULLARY (IM) NAIL INTERTROCHANTERIC Right 04/28/2017   Procedure: INTRAMEDULLARY (IM) NAIL INTERTROCHANTRIC;  Surgeon: Hiram Gash, MD;  Location: [facility];  Service: Orthopedics;  Laterality: Right;  . SPINE SURGERY  1971   deg disc disease x 2      Social History:     Social History   Tobacco Use  . Smoking status: Never Smoker  . Smokeless tobacco: Never Used  Substance Use Topics  . Alcohol use: Yes    Alcohol/week: 6.0 oz    Types: 10 Cans of beer per week    Comment: 10 cans throughout the day      Lives - at home  Mobility - walks well prior to surgery   Family History :     Family History  Problem Relation Age of Onset  . Diabetes Son        Home Medications:   Prior to Admission medications   Medication Sig Start Date End Date Taking? Authorizing Provider  amLODipine (NORVASC) 10 MG tablet TAKE 1 TABLET BY MOUTH DAILY Patient taking differently: Take 10 mg by mouth daily.  10/17/16  Yes Tower, Wynelle Fanny, MD  aspirin 81 MG tablet Take 81 mg by mouth daily.     Yes [provider]  b complex vitamins tablet Take 1 tablet by mouth daily.   Yes [provider]  Calcium Carbonate-Vit D-Min 600-400 MG-UNIT TABS Take 1 tablet by mouth daily.    Yes [provider]  Cyanocobalamin (VITAMIN B-12 PO) Take 1 tablet by mouth daily.   Yes [provider]  doxazosin (CARDURA) 8 MG tablet TAKE ONE (1) TABLET BY MOUTH EVERY DAY Patient taking differently: Take 8 mg by mouth daily.  10/17/16  Yes Tower, Wynelle Fanny, MD  ferrous sulfate 325 (65 FE) MG tablet Take 1 tablet (325 mg total) by mouth 3 (three) times daily with meals. 05/02/17  Yes Reyne Dumas, MD  folic acid (FOLVITE) 1 MG tablet Take 1 tablet (1 mg total) by mouth daily. 05/02/17  Yes Reyne Dumas, MD  ibuprofen (ADVIL,MOTRIN) 200 MG tablet Take 200 mg by mouth every 6 (six) hours as needed for fever or headache.   Yes [provider]  Multiple Vitamins-Minerals (PRESERVISION/LUTEIN) CAPS Take 1 capsule by mouth daily.     Yes [provider]  potassium chloride SA (K-DUR,KLOR-CON) 20 MEQ tablet Take 2 tablets (40 mEq total) by mouth 2 (two) times daily. 05/02/17  Yes Reyne Dumas, MD  spironolactone (ALDACTONE) 25 MG tablet Take 0.5 tablets (12.5 mg total) by mouth daily. 10/17/16  Yes Tower, Wynelle Fanny, MD  pantoprazole (PROTONIX) 40 MG tablet Take 1 tablet (40 mg total) by mouth daily. Patient not taking: Reported on 06/09/2017 05/02/17 05/02/18  Reyne Dumas, MD     Allergies:     Allergies  Allergen Reactions  . Lisinopril     REACTION: increased potassium leveles     Physical Exam:   Vitals  Blood pressure 122/66, pulse (!) 105, resp. rate 13, height 5' 7.5" (1.715 m), weight 64.4 kg (142 lb), SpO2 99 %.   1. General  lying in bed in NAD,   2. Normal affect and insight, Not Suicidal or Homicidal, Awake Alert, Oriented X 3.  3. No F.N deficits, ALL C.Nerves Intact, Strength 5/5 all 4 extremities, Sensation intact all 4 extremities, Plantars down going.  4. Ears and  Eyes appear Normal, Conjunctivae clear, PERRLA. Moist Oral Mucosa.  5. Supple Neck, No JVD, No cervical lymphadenopathy appriciated, No Carotid Bruits.  6. Symmetrical Chest wall movement, Good air movement bilaterally, CTAB.  7. Tachy s1, s2, no m/g/r   8. Positive Bowel Sounds, Abdomen Soft, No tenderness, No organomegaly appriciated,No rebound -guarding or rigidity.  9.  No Cyanosis, Normal Skin Turgor, No Skin Rash or Bruise.  10. Good muscle tone,  joints appear normal , no effusions, Normal ROM.  11. No Palpable Lymph Nodes in Neck or Axillae     Data Review:    CBC Recent Labs  Lab 06/09/17 1831  WBC 13.9*  HGB 11.3*  HCT 34.4*  PLT 413*  MCV 96.9  MCH 31.8  MCHC 32.8  RDW 16.1*  LYMPHSABS 1.8  MONOABS 0.7  EOSABS 2.8*  BASOSABS 0.0   ------------------------------------------------------------------------------------------------------------------  Chemistries  Recent Labs  Lab 06/09/17 1831  NA 138  K 4.1  CL 107  CO2 20*  GLUCOSE 103*  BUN 20  CREATININE 0.88  CALCIUM 8.5*  AST 27  ALT 24  ALKPHOS 167*  BILITOT 0.3   ------------------------------------------------------------------------------------------------------------------ estimated creatinine clearance is 59 mL/min (by C-G formula based on SCr of 0.88 mg/dL). ------------------------------------------------------------------------------------------------------------------ No results for input(s): TSH, T4TOTAL, T3FREE, THYROIDAB in the last 72 hours.  Invalid input(s): FREET3  Coagulation profile Recent Labs  Lab 06/09/17 1831  INR 1.03   ------------------------------------------------------------------------------------------------------------------- No results for input(s): DDIMER in the last 72 hours. -------------------------------------------------------------------------------------------------------------------  Cardiac Enzymes No results for input(s): CKMB, TROPONINI,  MYOGLOBIN in the last 168 hours.  Invalid input(s): CK ------------------------------------------------------------------------------------------------------------------ No results found for: BNP   ---------------------------------------------------------------------------------------------------------------  Urinalysis    Component Value Date/Time   COLORURINE YELLOW 01/21/2017 1151   APPEARANCEUR CLEAR 01/21/2017 1151   LABSPEC 1.011 01/21/2017 1151   PHURINE 8.0 01/21/2017 1151   GLUCOSEU NEGATIVE 01/21/2017 1151   HGBUR NEGATIVE 01/21/2017 1151   [place] 01/21/2017 1151   KETONESUR NEGATIVE 01/21/2017 1151   PROTEINUR NEGATIVE 01/21/2017 1151   NITRITE NEGATIVE 01/21/2017 1151   LEUKOCYTESUR NEGATIVE 01/21/2017 1151    ----------------------------------------------------------------------------------------------------------------   Imaging Results:    Ct Hip Right Wo Contrast  Result Date: 06/09/2017 CLINICAL DATA:  Fall, prior hip surgery EXAM: CT OF THE RIGHT HIP WITHOUT CONTRAST TECHNIQUE: Multidetector CT imaging of the right hip was performed according to the standard protocol. Multiplanar CT image reconstructions were also generated. COMPARISON:  Radiographs 06/09/2017, 04/28/2017 FINDINGS: Bones/Joint/Cartilage Patient is status post intramedullary rodding of the proximal right femur across comminuted intertrochanteric fracture. Displaced lesser trochanteric fracture fragments with developing callus. Multiple fracture fragments at the cephalad aspect of the vertical femoral rod. Multiple displaced cortical bone fragments at the proximal shaft of the femur posteriorly. Obliquely oriented cortical bone fragment anteriorly at the base of the femoral neck. The rod traversing the femoral head and neck penetrates the lateral cortex of the femur and protrudes into the posterolateral soft tissues, this appears increased compared to radiograph 04/28/2016. Slight  increased shortening of the femoral neck since the postoperative radiograph. No discrete lucencies around the femoral rod. No fracture through the hardware. Pubic symphysis and rami are intact. Ligaments Suboptimally assessed by CT. Muscles and Tendons No intramuscular hematoma. Soft tissues Soft tissue thickening and minimal fluid lateral to the trochanter, may reflect resolving postoperative fluid or hematoma. Skin thickening and subcutaneous edema are noted. IMPRESSION: 1. Status post intramedullary rodding of comminuted intertrochanteric fracture with displaced lesser trochanteric fracture fragment. Callus is forming at the fracture site. There appears to be slightly more lateral  positioning of the rod traversing the femoral head and neck with the lateral aspect protruding into the soft tissues, compared to prior radiographs. There appears to be some increased foreshortening/impaction at the femoral neck compared to the postoperative radiograph. Surgical hardware otherwise appears intact and without surrounding lucency. Electronically Signed   By: Donavan Foil M.D.   On: 06/09/2017 22:36   Dg Chest Port 1 View  Result Date: 06/09/2017 CLINICAL DATA:  Fall. EXAM: PORTABLE CHEST 1 VIEW COMPARISON:  04/28/2017 and prior radiographs FINDINGS: The cardiomediastinal silhouette is unremarkable. There is no evidence of focal airspace disease, pulmonary edema, suspicious pulmonary nodule/mass, pleural effusion, or pneumothorax. No acute bony abnormalities are identified. IMPRESSION: No active disease. Electronically Signed   By: Margarette Canada M.D.   On: 06/09/2017 19:32   Dg Hip Unilat W Or Wo Pelvis 2-3 Views Right  Result Date: 06/09/2017 CLINICAL DATA:  Acute LEFT hip pain following fall today. Initial encounter. EXAM: DG HIP (WITH OR WITHOUT PELVIS) 2-3V RIGHT COMPARISON:  04/28/2017 radiographs. FINDINGS: Intramedullary rod and screws within the RIGHT femur noted. The distal transfixing screw within the  distal femur is fractured-age indeterminate. No acute fracture, subluxation or dislocation identified. Remote proximal RIGHT femur fracture with displaced fragment and heterotopic ossification noted. IMPRESSION: 1. Fracture of the distal transfixing screw of the intramedullary rod within the distal femur-age indeterminate. 2. Remote fracture of the proximal RIGHT femur without definite acute abnormality. Electronically Signed   By: Margarette Canada M.D.   On: 06/09/2017 19:32    nsr at 90, nl axis, no st-t changes   Assessment & Plan:    Active Problems:   Anemia   Tachycardia   Fall   Fall, mechanical PT to evaluate and tx Pain control  Tachycardia likely due to pain Tele Trop I q6h x3 Check TSH Check D dimer, if positive then please order CTA chest r/o PE If tachycardia persistent then please order cardiac echo  Anemia Check cbc in am  CAD, CHF/Hypertension Cont amlodipine Cont spironolactone Cont aspirin  Bph Cont cardura  Gerd Cont PPI  Etoh dep Pt states has stopped etoh for >4 weeks CIWA  DVT Prophylaxis Lovenox - SCDs  AM Labs Ordered, also please review Full Orders  Family Communication: Admission, patients condition and plan of care including tests being ordered have been discussed with the patient  who indicate understanding and agree with the plan and Code Status.  Code Status FULL CODE  Likely DC to  home  Condition GUARDED   Consults called: orthopedics by ED  Admission status: observation  Time spent in minutes : 45   Jani Gravel M.D on 06/09/2017 at 11:17 PM  Between 7am to 7pm - Pager - [phone]  . After 7pm go to [email or web address] - password [facility]  [facility] - Office  [phone]

## 2017-06-09 NOTE — ED Provider Notes (Signed)
[facility] EMERGENCY DEPARTMENT Provider Note   CSN: [id number] Arrival date & time: 06/09/17  1804     History   Chief Complaint Chief Complaint  Patient presents with  . Fall    HPI Elijah Nixon is a 84 y.o. male.  HPI  The patient is an 84 year old male, he has a known history of alcohol abuse, he has a history of hypertension, history of prior hip fracture on the right, states that he was walking with his walker, he tripped over something when he fell forward landing on the carpet, he does not remember exactly how he fell or landed but does not recall hitting his head, he was unable to get up off the ground because of pain and now he states that that pain is in the right hip.  He denies loss of consciousness, chest pain shortness of breath abdominal pain back pain numbness or weakness but was unable to get up off the ground because of pain in the right hip.  Paramedics transported the patient, they noted that he could not ambulate spontaneously.  Past Medical History:  Diagnosis Date  . AA (alcohol abuse)    at least 8-10 drinks/day  . Arthritis    OA  . DDD (degenerative disc disease)   . ED (erectile dysfunction)   . Grover's disease   . History of colon polyps   . History of diverticulitis of colon   . Hypertension   . Macular degeneration    dry  . Osteoporosis    spinal compression fx    Patient Active Problem List   Diagnosis Date Noted  . S/P right hip fracture 04/28/2017  . Hyponatremia 04/28/2017  . Prediabetes 10/17/2016  . Anemia 10/17/2016  . 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 BPH (benign prostatic hyperplasia) 10/07/2016  . Lumbar disc disease 05/06/2015  . Routine general medical examination at a health care facility 10/02/2014  . Colon cancer screening 09/29/2013  . Encounter for Medicare annual wellness exam 09/27/2012  . Seborrheic keratosis 03/29/2012  . SWELLING, MASS, OR LUMP IN CHEST 06/24/2009  . ERECTILE DYSFUNCTION  02/16/2009  . ACTINIC KERATOSIS 02/16/2009  . Alcohol abuse 10/27/2008  . Essential hypertension 10/27/2008  . GEN OSTEOARTHROSIS INVOLVING MULTIPLE SITES 10/27/2008  . Osteoporosis 10/27/2008  . COLONIC POLYPS, HX OF 10/27/2008  . DIVERTICULITIS, HX OF 10/27/2008    Past Surgical History:  Procedure Laterality Date  . BACK SURGERY    . INTRAMEDULLARY (IM) NAIL INTERTROCHANTERIC Right 04/28/2017   Procedure: INTRAMEDULLARY (IM) NAIL INTERTROCHANTRIC;  Surgeon: Bjorn Pippin, MD;  Location: [facility] OR;  Service: Orthopedics;  Laterality: Right;  . SPINE SURGERY  1971   deg disc disease x 2       Home Medications    Prior to Admission medications   Medication Sig Start Date End Date Taking? Authorizing Provider  amLODipine (NORVASC) 10 MG tablet TAKE 1 TABLET BY MOUTH DAILY 10/17/16   Tower, Audrie Gallus, MD  aspirin 81 MG tablet Take 81 mg by mouth daily.      [provider]  b complex vitamins tablet Take 1 tablet by mouth daily.    [provider]  Calcium Carbonate-Vit D-Min 600-400 MG-UNIT TABS Take 1 tablet by mouth daily.     [provider]  Cyanocobalamin (VITAMIN B-12 PO) Take 1 tablet by mouth daily.    [provider]  doxazosin (CARDURA) 8 MG tablet TAKE ONE (1) TABLET BY MOUTH EVERY DAY 10/17/16   Tower,  Audrie GallusMarne A, MD  ferrous sulfate 325 (65 FE) MG tablet Take 1 tablet (325 mg total) by mouth 3 (three) times daily with meals. 05/02/17   Richarda OverlieAbrol, Nayana, MD  folic acid (FOLVITE) 1 MG tablet Take 1 tablet (1 mg total) by mouth daily. 05/02/17   Richarda OverlieAbrol, Nayana, MD  Multiple Vitamins-Minerals (PRESERVISION/LUTEIN) CAPS Take 1 capsule by mouth daily.      [provider]  nystatin (MYCOSTATIN/NYSTOP) powder Apply topically 3 (three) times daily. 05/02/17   Richarda OverlieAbrol, Nayana, MD  pantoprazole (PROTONIX) 40 MG tablet Take 1 tablet (40 mg total) by mouth daily. 05/02/17 05/02/18  Richarda OverlieAbrol, Nayana, MD  potassium chloride SA (K-DUR,KLOR-CON) 20 MEQ tablet Take 2  tablets (40 mEq total) by mouth 2 (two) times daily. 05/02/17   Richarda OverlieAbrol, Nayana, MD  spironolactone (ALDACTONE) 25 MG tablet Take 0.5 tablets (12.5 mg total) by mouth daily. 10/17/16   Tower, Audrie GallusMarne A, MD    Family History Family History  Problem Relation Age of Onset  . Diabetes Son     Social History Social History   Tobacco Use  . Smoking status: Never Smoker  . Smokeless tobacco: Never Used  Substance Use Topics  . Alcohol use: Yes    Alcohol/week: 6.0 oz    Types: 10 Cans of beer per week    Comment: 10 cans throughout the day   . Drug use: No     Allergies   Lisinopril   Review of Systems Review of Systems  All other systems reviewed and are negative.    Physical Exam Updated Vital Signs Ht 5' 7.5" (1.715 m)   Wt 64.4 kg (142 lb)   BMI 21.91 kg/m   Physical Exam  Constitutional: He appears well-developed and well-nourished. No distress.  HENT:  Head: Normocephalic and atraumatic.  Mouth/Throat: Oropharynx is clear and moist. No oropharyngeal exudate.  Eyes: Conjunctivae and EOM are normal. Pupils are equal, round, and reactive to light. Right eye exhibits no discharge. Left eye exhibits no discharge. No scleral icterus.  Neck: Normal range of motion. Neck supple. No JVD present. No thyromegaly present.  Cardiovascular: Normal rate, regular rhythm, normal heart sounds and intact distal pulses. Exam reveals no gallop and no friction rub.  No murmur heard. Pulmonary/Chest: Effort normal and breath sounds normal. No respiratory distress. He has no wheezes. He has no rales.  Abdominal: Soft. Bowel sounds are normal. He exhibits no distension and no mass. There is no tenderness.  Musculoskeletal: He exhibits tenderness and deformity. He exhibits no edema.  Decreased range of motion of the right hip, there is leg length discrepancy with the right leg shorter than the left with the foot being shortened and rotated outward.  Lymphadenopathy:    He has no cervical  adenopathy.  Neurological: He is alert. Coordination normal.  The patient has normal strength and sensation in the bilateral lower extremities.  He has difficulty moving the right leg secondary to pain at the hip  Skin: Skin is warm and dry. Rash noted. No erythema.  Patient has a diffuse erythematous rash with some scaling diffusely  Psychiatric: He has a normal mood and affect. His behavior is normal.  Nursing note and vitals reviewed.    ED Treatments / Results  Labs (all labs ordered are listed, but only abnormal results are displayed) Labs Reviewed  CBC WITH DIFFERENTIAL/PLATELET - Abnormal; Notable for the following components:      Result Value   WBC 13.9 (*)    RBC 3.55 (*)  Hemoglobin 11.3 (*)    HCT 34.4 (*)    RDW 16.1 (*)    Platelets 413 (*)    Neutro Abs 8.6 (*)    Eosinophils Absolute 2.8 (*)    All other components within normal limits  COMPREHENSIVE METABOLIC PANEL - Abnormal; Notable for the following components:   CO2 20 (*)    Glucose, Bld 103 (*)    Calcium 8.5 (*)    Total Protein 6.0 (*)    Albumin 2.9 (*)    Alkaline Phosphatase 167 (*)    All other components within normal limits  PROTIME-INR  TYPE AND SCREEN    EKG  EKG Interpretation None       Radiology No results found.  Procedures Procedures (including critical care time)  Medications Ordered in ED Medications - No data to display   Initial Impression / Assessment and Plan / ED Course  I have reviewed the triage vital signs and the nursing notes.  Pertinent labs & imaging results that were available during my care of the patient were reviewed by me and considered in my medical decision making (see chart for details).  Clinical Course as of Jun 09 2208  Sat Jun 09, 2017  1954 Discussed with Dr. Jena Gauss MD who recommends CT scan of the hip, may need admission for physical therapy as the patient has a difficult time walking and ongoing pain.  [BM]    Clinical Course User  Index [BM] Eber Hong, MD    The patient's presentation is consistent with a hip fracture.  He will obtain x-rays of the hip as well as the lungs, will obtain preoperative labs including type and screen.  Was operated on by Dr. Everardo Pacific Delbert Harness) one month ago for hip fracture.  My review of the x-rays show fracture  Of some of the hardware however the bone appears intact.  CT scan ordered at the request of orthopedics.  Ms. Valeda Malm PA-C  will follow the results and disposition accoringly  Final Clinical Impressions(s) / ED Diagnoses   Final diagnoses:  Fall, initial encounter  Hip injury, right, initial encounter    ED Discharge Orders    None       Eber Hong, MD 06/11/17 1001

## 2017-06-09 NOTE — ED Provider Notes (Signed)
Broken hip x 1 month Harvie JuniorMurphy Fell tonight and landed on same hip Has distal femur screw fracture - not bone Admit to medicine - Dr. Jena GaussHaddix - PT in am  CT hip pending Plan: if + for fx - take to ortho  CT results show ?changes. Discussed and reviewed with Dr. Jena GaussHaddix who states CT findings are non-concerning to him. Plan to admit to medicine for am PT. Per patient and wife, they would prefer discharge home after PT as planned as opposed to rehab admission in [place]. They have OT/PT set up at home for mon-thur and would prefer to continue therapy in home. Discussed with Dr. Selena BattenKim who accepts for observation and am PT.   Elpidio AnisUpstill, Kadence Mikkelson, PA-C 06/09/17 2331    Eber HongMiller, Brian, MD 06/11/17 1001

## 2017-06-10 ENCOUNTER — Observation Stay (HOSPITAL_COMMUNITY): Payer: Medicare Other

## 2017-06-10 DIAGNOSIS — D638 Anemia in other chronic diseases classified elsewhere: Secondary | ICD-10-CM | POA: Diagnosis not present

## 2017-06-10 DIAGNOSIS — W19XXXS Unspecified fall, sequela: Secondary | ICD-10-CM

## 2017-06-10 DIAGNOSIS — R000 Tachycardia, unspecified: Secondary | ICD-10-CM | POA: Diagnosis not present

## 2017-06-10 LAB — COMPREHENSIVE METABOLIC PANEL
ALBUMIN: 2.5 g/dL — AB (ref 3.5–5.0)
ALK PHOS: 149 U/L — AB (ref 38–126)
ALT: 23 U/L (ref 17–63)
AST: 31 U/L (ref 15–41)
Anion gap: 9 (ref 5–15)
BUN: 18 mg/dL (ref 6–20)
CALCIUM: 8.1 mg/dL — AB (ref 8.9–10.3)
CO2: 21 mmol/L — AB (ref 22–32)
CREATININE: 0.81 mg/dL (ref 0.61–1.24)
Chloride: 108 mmol/L (ref 101–111)
GFR calc Af Amer: >60 mL/min (ref >60)
GFR calc non Af Amer: >60 mL/min (ref >60)
GLUCOSE: 122 mg/dL — AB (ref 65–99)
Potassium: 3.8 mmol/L (ref 3.5–5.1)
Sodium: 138 mmol/L (ref 135–145)
Total Bilirubin: 0.6 mg/dL (ref 0.3–1.2)
Total Protein: 5.3 g/dL — ABNORMAL LOW (ref 6.5–8.1)

## 2017-06-10 LAB — TSH: TSH: 2.234 u[IU]/mL (ref 0.350–4.500)

## 2017-06-10 LAB — CBC
HCT: 32 % — ABNORMAL LOW (ref 39.0–52.0)
HEMOGLOBIN: 10.6 g/dL — AB (ref 13.0–17.0)
MCH: 31.7 pg (ref 26.0–34.0)
MCHC: 33.1 g/dL (ref 30.0–36.0)
MCV: 95.8 fL (ref 78.0–100.0)
Platelets: 428 10*3/uL — ABNORMAL HIGH (ref 150–400)
RBC: 3.34 MIL/uL — AB (ref 4.22–5.81)
RDW: 16.1 % — ABNORMAL HIGH (ref 11.5–15.5)
WBC: 15 10*3/uL — ABNORMAL HIGH (ref 4.0–10.5)

## 2017-06-10 LAB — TROPONIN I

## 2017-06-10 LAB — D-DIMER, QUANTITATIVE: D-Dimer, Quant: 5.43 ug/mL-FEU — ABNORMAL HIGH (ref 0.00–0.50)

## 2017-06-10 MED ORDER — FERROUS SULFATE 325 (65 FE) MG PO TABS
325.0000 mg | ORAL_TABLET | Freq: Three times a day (TID) | ORAL | Status: DC
  Administered 2017-06-10: 325 mg via ORAL
  Filled 2017-06-10: qty 1

## 2017-06-10 MED ORDER — ENOXAPARIN SODIUM 40 MG/0.4ML ~~LOC~~ SOLN: 40.0000 mg | SUBCUTANEOUS | Status: DC

## 2017-06-10 MED ORDER — AMLODIPINE BESYLATE 10 MG PO TABS
10.0000 mg | ORAL_TABLET | Freq: Every day | ORAL | Status: DC
  Administered 2017-06-10: 10 mg via ORAL
  Filled 2017-06-10: qty 1

## 2017-06-10 MED ORDER — SODIUM CHLORIDE 0.9% FLUSH
3.0000 mL | Freq: Two times a day (BID) | INTRAVENOUS | Status: DC
  Administered 2017-06-10 (×2): 3 mL via INTRAVENOUS

## 2017-06-10 MED ORDER — SODIUM CHLORIDE 0.9% FLUSH: 3.0000 mL | INTRAVENOUS | Status: DC | PRN

## 2017-06-10 MED ORDER — ACETAMINOPHEN 650 MG RE SUPP: 650.0000 mg | Freq: Four times a day (QID) | RECTAL | Status: DC | PRN

## 2017-06-10 MED ORDER — MORPHINE SULFATE (PF) 2 MG/ML IV SOLN
1.0000 mg | INTRAVENOUS | Status: DC | PRN
  Administered 2017-06-10: 1 mg via INTRAVENOUS
  Filled 2017-06-10: qty 1

## 2017-06-10 MED ORDER — ASPIRIN 81 MG PO CHEW
81.0000 mg | CHEWABLE_TABLET | Freq: Every day | ORAL | Status: DC
  Administered 2017-06-10: 81 mg via ORAL
  Filled 2017-06-10: qty 1

## 2017-06-10 MED ORDER — IOPAMIDOL (ISOVUE-370) INJECTION 76%
INTRAVENOUS | Status: AC
  Administered 2017-06-10: 100 mL
  Filled 2017-06-10: qty 100

## 2017-06-10 MED ORDER — SPIRONOLACTONE 12.5 MG HALF TABLET
12.5000 mg | ORAL_TABLET | Freq: Every day | ORAL | Status: DC
  Administered 2017-06-10: 12.5 mg via ORAL
  Filled 2017-06-10: qty 1

## 2017-06-10 MED ORDER — FOLIC ACID 1 MG PO TABS
1.0000 mg | ORAL_TABLET | Freq: Every day | ORAL | Status: DC
  Administered 2017-06-10: 1 mg via ORAL
  Filled 2017-06-10: qty 1

## 2017-06-10 MED ORDER — SODIUM CHLORIDE 0.9 % IV SOLN: 250.0000 mL | INTRAVENOUS | Status: DC | PRN

## 2017-06-10 MED ORDER — ACETAMINOPHEN 325 MG PO TABS
650.0000 mg | ORAL_TABLET | Freq: Four times a day (QID) | ORAL | Status: DC | PRN
  Administered 2017-06-10: 650 mg via ORAL
  Filled 2017-06-10: qty 2

## 2017-06-10 MED ORDER — DOXAZOSIN MESYLATE 8 MG PO TABS
8.0000 mg | ORAL_TABLET | Freq: Every day | ORAL | Status: DC
Start: 2017-06-10 — End: 2017-06-10
  Administered 2017-06-10: 8 mg via ORAL
  Filled 2017-06-10: qty 1

## 2017-06-10 MED ORDER — POTASSIUM CHLORIDE CRYS ER 20 MEQ PO TBCR
40.0000 meq | EXTENDED_RELEASE_TABLET | Freq: Two times a day (BID) | ORAL | Status: DC
  Administered 2017-06-10: 40 meq via ORAL
  Filled 2017-06-10: qty 2

## 2017-06-10 MED ORDER — PRO-STAT SUGAR FREE PO LIQD
30.0000 mL | Freq: Two times a day (BID) | ORAL | Status: DC
  Administered 2017-06-10: 30 mL via ORAL
  Filled 2017-06-10: qty 30

## 2017-06-10 NOTE — Progress Notes (Signed)
Orthopaedic Progress Note  S: Please see previous progress note the patient fell yesterday evening and had increased pain.  X-rays show distal interlock that was broken.  Currently doing relatively well, wants to go home.  O:  Vitals:   06/10/17 0023 06/10/17 0520  BP: (!) 147/60 123/68  Pulse: 100 60  Resp: 18 16  Temp: 97.6 F (36.4 C) 97.9 F (36.6 C)  SpO2: 99% 94%   General: No acute distress, awake alert and oriented x3, cooperative and pleasant Right lower extremity reveals well-healed incisions.  Gentle flexion of the hip and internal and external rotation cause some mild discomfort but no significant amount of pain.  Axial compression was able to be tolerated without difficulty.  He is neurovascularly intact distally  Imaging: X-rays and CT scan were reviewed which showed stable proximal hardware.  There is been some interval shortening of the intertrochanteric fracture.  However no signs of failure.  The distal interlock is fractured but the remainder of the implant is stable.  Labs:  CBC    Component Value Date/Time   WBC 15.0 (H) 06/10/2017 0150   RBC 3.34 (L) 06/10/2017 0150   HGB 10.6 (L) 06/10/2017 0150   HCT 32.0 (L) 06/10/2017 0150   PLT 428 (H) 06/10/2017 0150   MCV 95.8 06/10/2017 0150   MCH 31.7 06/10/2017 0150   MCHC 33.1 06/10/2017 0150   RDW 16.1 (H) 06/10/2017 0150   LYMPHSABS 1.8 06/09/2017 1831   MONOABS 0.7 06/09/2017 1831   EOSABS 2.8 (H) 06/09/2017 1831   BASOSABS 0.0 06/09/2017 183701    A/P: 84 year old male approximately 6 weeks out from a cephalo-medullary nailing of right intertrochanteric femur fracture with new fracture distal interlock.  Recommend weightbearing as tolerated.  No role for orthopedic intervention.  He may follow-up with Dr. [name] in approximately 2 weeks after discharge.  Roby LoftsKevin P. Haddix, MD [facility] [phone] (phone)

## 2017-06-10 NOTE — Progress Notes (Signed)
Removed IV, provided discharge education/instructions, all questions and concerns addressed, Pt not in distress, discharged home with belongings accompanied by wife. 

## 2017-06-10 NOTE — Care Management Obs Status (Signed)
MEDICARE OBSERVATION STATUS NOTIFICATION   Patient Details  Name: Elijah Nixon MRN: [id number] Date of Birth: [birth year]   Medicare Observation Status Notification Given:  Yes    Isaias CowmanOliveras-Aizpurua, Emonee Winkowski, RN 06/10/2017, 2:10 PM

## 2017-06-10 NOTE — Discharge Instructions (Signed)
Please get your medications reviewed and adjusted by your Primary MD. ° °Please request your Primary MD to go over all Hospital Tests and Procedure/Radiological results at the follow up, please get all Hospital records sent to your Prim MD by signing hospital release before you go home. ° °If you had Pneumonia of Lung problems at the Hospital: °Please get a 2 view Chest X ray done in 6-8 weeks after hospital discharge or sooner if instructed by your Primary MD. ° °If you have Congestive Heart Failure: °Please call your Cardiologist or Primary MD anytime you have any of the following symptoms:  °1) 3 pound weight gain in 24 hours or 5 pounds in 1 week  °2) shortness of breath, with or without a dry hacking cough  °3) swelling in the hands, feet or stomach  °4) if you have to sleep on extra pillows at night in order to breathe ° °Follow cardiac low salt diet and 1.5 lit/day fluid restriction. ° °If you have diabetes °Accuchecks 4 times/day, Once in AM empty stomach and then before each meal. °Log in all results and show them to your primary doctor at your next visit. °If any glucose reading is under 80 or above 300 call your primary MD immediately. ° °If you have Seizure/Convulsions/Epilepsy: °Please do not drive, operate heavy machinery, participate in activities at heights or participate in high speed sports until you have seen by Primary MD or a Neurologist and advised to do so again. ° °If you had Gastrointestinal Bleeding: °Please ask your Primary MD to check a complete blood count within one week of discharge or at your next visit. Your endoscopic/colonoscopic biopsies that are pending at the time of discharge, will also need to followed by your Primary MD. ° °Get Medicines reviewed and adjusted. °Please take all your medications with you for your next visit with your Primary MD ° °Please request your Primary MD to go over all hospital tests and procedure/radiological results at the follow up, please ask your  Primary MD to get all Hospital records sent to his/her office. ° °If you experience worsening of your admission symptoms, develop shortness of breath, life threatening emergency, suicidal or homicidal thoughts you must seek medical attention immediately by calling 911 or calling your MD immediately  if symptoms less severe. ° °You must read complete instructions/literature along with all the possible adverse reactions/side effects for all the Medicines you take and that have been prescribed to you. Take any new Medicines after you have completely understood and accpet all the possible adverse reactions/side effects.  ° °Do not drive or operate heavy machinery when taking Pain medications.  ° °Do not take more than prescribed Pain, Sleep and Anxiety Medications ° °Special Instructions: If you have smoked or chewed Tobacco  in the last 2 yrs please stop smoking, stop any regular Alcohol  and or any Recreational drug use. ° °Wear Seat belts while driving. ° °Please note °You were cared for by a hospitalist during your hospital stay. If you have any questions about your discharge medications or the care you received while you were in the hospital after you are discharged, you can call the unit and asked to speak with the hospitalist on call if the hospitalist that took care of you is not available. Once you are discharged, your primary care physician will handle any further medical issues. Please note that NO REFILLS for any discharge medications will be authorized once you are discharged, as it is imperative that you   return to your primary care physician (or establish a relationship with a primary care physician if you do not have one) for your aftercare needs so that they can reassess your need for medications and monitor your lab values.  You can reach the hospitalist office at phone [phone] or fax [phone]   If you do not have a primary care physician, you can call [phone] for a physician  referral.   Fall Prevention in the Home Falls can cause injuries. They can happen to people of all ages. There are many things you can do to make your home safe and to help prevent falls. What can I do on the outside of my home?  Regularly fix the edges of walkways and driveways and fix any cracks.  Remove anything that might make you trip as you walk through a door, such as a raised step or threshold.  Trim any bushes or trees on the path to your home.  Use bright outdoor lighting.  Clear any walking paths of anything that might make someone trip, such as rocks or tools.  Regularly check to see if handrails are loose or broken. Make sure that both sides of any steps have handrails.  Any raised decks and porches should have guardrails on the edges.  Have any leaves, snow, or ice cleared regularly.  Use sand or salt on walking paths during winter.  Clean up any spills in your garage right away. This includes oil or grease spills. What can I do in the bathroom?  Use night lights.  Install grab bars by the toilet and in the tub and shower. Do not use towel bars as grab bars.  Use non-skid mats or decals in the tub or shower.  If you need to sit down in the shower, use a plastic, non-slip stool.  Keep the floor dry. Clean up any water that spills on the floor as soon as it happens.  Remove soap buildup in the tub or shower regularly.  Attach bath mats securely with double-sided non-slip rug tape.  Do not have throw rugs and other things on the floor that can make you trip. What can I do in the bedroom?  Use night lights.  Make sure that you have a light by your bed that is easy to reach.  Do not use any sheets or blankets that are too big for your bed. They should not hang down onto the floor.  Have a firm chair that has side arms. You can use this for support while you get dressed.  Do not have throw rugs and other things on the floor that can make you trip. What  can I do in the kitchen?  Clean up any spills right away.  Avoid walking on wet floors.  Keep items that you use a lot in easy-to-reach places.  If you need to reach something above you, use a strong step stool that has a grab bar.  Keep electrical cords out of the way.  Do not use floor polish or wax that makes floors slippery. If you must use wax, use non-skid floor wax.  Do not have throw rugs and other things on the floor that can make you trip. What can I do with my stairs?  Do not leave any items on the stairs.  Make sure that there are handrails on both sides of the stairs and use them. Fix handrails that are broken or loose. Make sure that handrails are as long as the stairways.  Check any carpeting to make sure that it is firmly attached to the stairs. Fix any carpet that is loose or worn.  Avoid having throw rugs at the top or bottom of the stairs. If you do have throw rugs, attach them to the floor with carpet tape.  Make sure that you have a light switch at the top of the stairs and the bottom of the stairs. If you do not have them, ask someone to add them for you. What else can I do to help prevent falls?  Wear shoes that: ? Do not have high heels. ? Have rubber bottoms. ? Are comfortable and fit you well. ? Are closed at the toe. Do not wear sandals.  If you use a stepladder: ? Make sure that it is fully opened. Do not climb a closed stepladder. ? Make sure that both sides of the stepladder are locked into place. ? Ask someone to hold it for you, if possible.  Clearly mark and make sure that you can see: ? Any grab bars or handrails. ? First and last steps. ? Where the edge of each step is.  Use tools that help you move around (mobility aids) if they are needed. These include: ? Canes. ? Walkers. ? Scooters. ? Crutches.  Turn on the lights when you go into a dark area. Replace any light bulbs as soon as they burn out.  Set up your furniture so you  have a clear path. Avoid moving your furniture around.  If any of your floors are uneven, fix them.  If there are any pets around you, be aware of where they are.  Review your medicines with your doctor. Some medicines can make you feel dizzy. This can increase your chance of falling. Ask your doctor what other things that you can do to help prevent falls. This information is not intended to replace advice given to you by your health care provider. Make sure you discuss any questions you have with your health care provider. Document Released: 01/28/2009 Document Revised: 09/09/2015 Document Reviewed: 05/08/2014 [employer] Interactive Patient Education  [employer]2018 [employer]

## 2017-06-10 NOTE — Discharge Summary (Signed)
Physician Discharge Summary  Elijah RasmussenBilly W Nixon [name]:[id number] DOB: 01/09/1933  PCP: Elijah Nixon, Elijah A, MD  Admit date: 06/09/2017 Discharge date: 06/10/2017  Recommendations for Outpatient Follow-up:  1. Dr. Roxy MannsMarne Tower, PCP in 1 week with repeat labs (CBC & BMP). 2. Dr. Ramond Marrowax Nixon, Orthopedics in 2 weeks.  Home Health: Resume PTA home health services.  As per patient and family, scheduled for home health PT tomorrow 06/11/17. Equipment/Devices: None  Discharge Condition: Improved and stable CODE STATUS: Full Diet recommendation: Heart healthy diet.  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 Everardo Nixon following which he underwent approximately 3 weeks of rehab at [facility], has been back home for the last 2 weeks, ambulates with the help of Nixon walker, sustained Nixon unwitnessed mechanical fall at home while ambulating with his walker, feels like he might have tripped on the walker and landed on his buttocks.  He was helped up by family.  No head injury or loss of consciousness.  No dizziness, lightheadedness, chest pain, palpitations or dyspnea.  He sustained Nixon superficial abrasion to his left elbow region and some pain in the right hip.  He does have chronic lower extremity edema for which he takes diuretics.  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  Patient underwent CT of the right hip in the ED which was reviewed by orthopedics who did not feel that any intervention was needed.  He was mildly tachycardic likely due to pain.  He was observed in the hospital overnight.  Assessment and plan:  1. Mechanical fall: PT evaluated patient and did well with ambulation and  they recommend returning home to resume home health physical therapy.  Patient has Nixon scheduled appointment for tomorrow 06/11/17.  Apart from small superficial abrasion over left elbow, no major injuries noted. 2. Status post cephalo-medullary nailing of right intertrochanteric femur fracture: Orthopedics consultation and follow-up appreciated.  The distal interlock is fractured but remainder of implant is stable.  Recommend weightbearing as tolerated, no role for orthopedic intervention and outpatient follow-up with Dr. Everardo Nixon in 2 weeks. 3. Sinus tachycardia: Likely related to pain.  No arrhythmias noted on telemetry which shows lots of artifacts.  TSH normal.  Troponin x2: Negative.  Patient did not have any cardiorespiratory symptoms.  D-dimer was checked and positive.  This was followed with Nixon CTA chest: No CT evidence of pulmonary embolism.  Resolved. 4. Anemia of chronic disease and iron deficiency: No overt bleeding.  Relatively stable.  Follow CBCs in Nixon few days as outpatient.  Continue iron supplements.  Patient has declined screening colonoscopy as per PCP notes. 5. Essential hypertension: Controlled.  Continue doxazosin and amlodipine. 6. Chronic lower extremity edema: Patient is on Aldactone and in addition to potassium supplements.  Potassium is normal.  Follow BMP in Nixon few days as outpatient. 7. Alcohol dependence: Patient reported that he quit alcohol about 4 weeks ago.  Encouraged abstinence. 8. 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  Outpatient follow-up with dermatology. 9. Leukocytosis: May be reactive.  No clinical suspicion for infection.  Follow CBCs in Nixon few days as outpatient.  Consultations:  Orthopedics  Procedures:  None   Discharge Instructions  Discharge Instructions    Call MD for:  difficulty breathing, headache or visual disturbances   Complete by:  As directed    Call MD for:  extreme fatigue   Complete by:  As  directed    Call MD for:  persistant dizziness or light-headedness   Complete by:  As directed    Call MD for:  redness, tenderness, or signs of infection (pain, swelling, redness, odor or green/yellow discharge around incision site)   Complete by:  As directed    Call MD for:  severe uncontrolled pain   Complete by:  As directed    Diet - low sodium heart healthy   Complete by:  As directed    Increase activity slowly   Complete by:  As directed        Medication List    STOP taking these medications   pantoprazole 40 MG tablet Commonly known as:  PROTONIX     TAKE these medications   amLODipine 10 MG tablet Commonly known as:  NORVASC TAKE 1 TABLET BY MOUTH DAILY What changed:    how much to take  how to take this  when to take this  additional instructions   aspirin 81 MG tablet Take 81 mg by mouth daily.   b complex vitamins tablet Take 1 tablet by mouth daily.   Calcium Carbonate-Vit D-Min 600-400 MG-UNIT Tabs Take 1 tablet by mouth daily.   doxazosin 8 MG tablet Commonly known as:  CARDURA TAKE ONE (1) TABLET BY MOUTH EVERY DAY What changed:    how much to take  how to take this  when to take this  additional instructions   ferrous sulfate 325 (65 FE) MG tablet Take 1 tablet (325 mg total) by mouth 3 (three) times daily with meals.   folic acid 1 MG tablet Commonly known as:  FOLVITE Take 1 tablet (1 mg total) by mouth daily.   ibuprofen 200 MG tablet Commonly known as:  ADVIL,MOTRIN Take 200 mg by mouth every 6 (six) hours as needed for fever or headache.   potassium chloride SA 20 MEQ tablet Commonly known as:  K-DUR,KLOR-CON Take 2 tablets (40 mEq total) by mouth 2 (two) times daily.   PRESERVISION/LUTEIN Caps Take 1 capsule by mouth daily.   spironolactone 25 MG tablet Commonly known as:  ALDACTONE Take 0.5 tablets (12.5 mg total) by mouth daily.   VITAMIN B-12 PO Take 1 tablet by mouth daily.      Follow-up Information     Tower, Elijah Gallus, MD. Schedule an appointment as soon as possible for Nixon visit in 1 week(s).   Specialties:  Family Medicine, Radiology Why:  To be seen with repeat labs (CBC & BMP). Contact information: [address] [place] [address] [phone]        Bjorn Pippin, MD. Schedule an appointment as soon as possible for Nixon visit in 2 week(s).   Specialty:  Orthopedic Surgery Contact information: [address] [place] [address] [phone]          Allergies  Allergen Reactions  . Lisinopril     REACTION: increased potassium leveles      Procedures/Studies: Ct Angio Chest Pe W Or Wo Contrast  Result Date: 06/10/2017 CLINICAL DATA:  84 year old male with concern for PE. EXAM: CT ANGIOGRAPHY CHEST WITH CONTRAST TECHNIQUE: Multidetector CT imaging of the chest was performed using the standard protocol during bolus administration of intravenous contrast. Multiplanar CT image reconstructions  and MIPs were obtained to evaluate the vascular anatomy. CONTRAST:  ISOVUE-370 IOPAMIDOL (ISOVUE-370) INJECTION 76% COMPARISON:  Chest radiograph dated 06/09/2017 FINDINGS: Cardiovascular: There is mild cardiomegaly. No pericardial effusion. Multi vessel coronary vascular calcification. Mild atherosclerotic calcification of the thoracic aorta. The origins of the great vessels of the aortic arch are patent. No CT evidence of pulmonary embolism Mediastinum/Nodes: No hilar or mediastinal adenopathy. Esophagus and the thyroid gland are grossly unremarkable. No mediastinal fluid collection. Lungs/Pleura: Small bilateral pleural effusions. Bibasilar linear atelectasis/scarring. There is no focal consolidation, or pneumothorax. The central airways are patent. Upper Abdomen: No acute abnormality. Musculoskeletal: Degenerative changes of the spine. No acute osseous pathology. Review of the MIP images confirms the above findings. IMPRESSION: 1. No CT evidence of pulmonary  embolism. 2. Small bilateral pleural effusions. 3. Mild cardiomegaly and coronary vascular calcification. Electronically Signed   By: Elgie Collard M.D.   On: 06/10/2017 05:27   Ct Hip Right Wo Contrast  Result Date: 06/09/2017 CLINICAL DATA:  Fall, prior hip surgery EXAM: CT OF THE RIGHT HIP WITHOUT CONTRAST TECHNIQUE: Multidetector CT imaging of the right hip was performed according to the standard protocol. Multiplanar CT image reconstructions were also generated. COMPARISON:  Radiographs 06/09/2017, 04/28/2017 FINDINGS: Bones/Joint/Cartilage Patient is status post intramedullary rodding of the proximal right femur across comminuted intertrochanteric fracture. Displaced lesser trochanteric fracture fragments with developing callus. Multiple fracture fragments at the cephalad aspect of the vertical femoral rod. Multiple displaced cortical bone fragments at the proximal shaft of the femur posteriorly. Obliquely oriented cortical bone fragment anteriorly at the base of the femoral neck. The rod traversing the femoral head and neck penetrates the lateral cortex of the femur and protrudes into the posterolateral soft tissues, this appears increased compared to radiograph 04/28/2016. Slight increased shortening of the femoral neck since the postoperative radiograph. No discrete lucencies around the femoral rod. No fracture through the hardware. Pubic symphysis and rami are intact. Ligaments Suboptimally assessed by CT. Muscles and Tendons No intramuscular hematoma. Soft tissues Soft tissue thickening and minimal fluid lateral to the trochanter, may reflect resolving postoperative fluid or hematoma. Skin thickening and subcutaneous edema are noted. IMPRESSION: 1. Status post intramedullary rodding of comminuted intertrochanteric fracture with displaced lesser trochanteric fracture fragment. Callus is forming at the fracture site. There appears to be slightly more lateral positioning of the rod traversing the  femoral head and neck with the lateral aspect protruding into the soft tissues, compared to prior radiographs. There appears to be some increased foreshortening/impaction at the femoral neck compared to the postoperative radiograph. Surgical hardware otherwise appears intact and without surrounding lucency. Electronically Signed   By: Jasmine Pang M.D.   On: 06/09/2017 22:36   Dg Chest Port 1 View  Result Date: 06/09/2017 CLINICAL DATA:  Fall. EXAM: PORTABLE CHEST 1 VIEW COMPARISON:  04/28/2017 and prior radiographs FINDINGS: The cardiomediastinal silhouette is unremarkable. There is no evidence of focal airspace disease, pulmonary edema, suspicious pulmonary nodule/mass, pleural effusion, or pneumothorax. No acute bony abnormalities are identified. IMPRESSION: No active disease. Electronically Signed   By: Harmon Pier M.D.   On: 06/09/2017 19:32   Dg Hip Unilat W Or Wo Pelvis 2-3 Views Right  Result Date: 06/09/2017 CLINICAL DATA:  Acute LEFT hip pain following fall today. Initial encounter. EXAM: DG HIP (WITH OR WITHOUT PELVIS) 2-3V RIGHT COMPARISON:  04/28/2017 radiographs. FINDINGS: Intramedullary rod and screws within the RIGHT femur noted. The distal transfixing screw within the distal femur is fractured-age indeterminate. No acute  fracture, subluxation or dislocation identified. Remote proximal RIGHT femur fracture with displaced fragment and heterotopic ossification noted. IMPRESSION: 1. Fracture of the distal transfixing screw of the intramedullary rod within the distal femur-age indeterminate. 2. Remote fracture of the proximal RIGHT femur without definite acute abnormality. Electronically Signed   By: Harmon Pier M.D.   On: 06/09/2017 19:32      Subjective: Patient interviewed and examined with his spouse at bedside.  States that he feels fine and denies complaints.  Denies pain in the right hip even with ambulation.  Both report intermittent worsening of rash since surgery and worse when  it gets hot.  Has chronic leg edema for which she takes diuretics.  No leg pain, chest pain, dyspnea, dizziness or lightheadedness.  They feel that he is back to his baseline.  Discharge Exam:  Vitals:   06/09/17 2345 06/10/17 0023 06/10/17 0500 06/10/17 0520  BP: 133/68 (!) 147/60  123/68  Pulse:  100  60  Resp: 14 18  16   Temp:  97.6 F (36.4 C)  97.9 F (36.6 C)  TempSrc:  Oral  Oral  SpO2:  99%  94%  Weight:   64.4 kg (141 lb 15.6 oz)   Height:        General: Pleasant elderly male, moderately built and nourished, sitting up comfortably in bed. Cardiovascular: S1 & S2 heard, RRR, S1/S2 +. No murmurs, rubs, gallops or clicks. No JVD.  1+ pitting bilateral leg edema.  Telemetry: Lots of artifacts but essentially sinus rhythm and no arrhythmias appreciated. Respiratory: Clear to auscultation without wheezing, rhonchi or crackles. No increased work of breathing. Abdominal:  Non distended, non tender & soft. No organomegaly or masses appreciated. Normal bowel sounds heard. CNS: Alert and oriented. No focal deficits. Extremities: Symmetric normal power in all limbs.  Right hip surgical site with healed scar.  No acute findings. Skin: Extensive chronic skin rash of his Grover's disease, most prominent on his lower extremities.  Small approximately 2 cm diameter superficial abrasion/skin tear over left elbow without acute findings.    The results of significant diagnostics from this hospitalization (including imaging, microbiology, ancillary and laboratory) are listed below for reference.      Labs: CBC: Recent Labs  Lab 06/09/17 1831 06/10/17 0150  WBC 13.9* 15.0*  NEUTROABS 8.6*  --   HGB 11.3* 10.6*  HCT 34.4* 32.0*  MCV 96.9 95.8  PLT 413* 428*   Basic Metabolic Panel: Recent Labs  Lab 06/09/17 1831 06/10/17 0150  NA 138 138  K 4.1 3.8  CL 107 108  CO2 20* 21*  GLUCOSE 103* 122*  BUN 20 18  CREATININE 0.88 0.81  CALCIUM 8.5* 8.1*   Liver Function  Tests: Recent Labs  Lab 06/09/17 1831 06/10/17 0150  AST 27 31  ALT 24 23  ALKPHOS 167* 149*  BILITOT 0.3 0.6  PROT 6.0* 5.3*  ALBUMIN 2.9* 2.5*   Cardiac Enzymes: Recent Labs  Lab 06/10/17 0035 06/10/17 0150  TROPONINI <0.03 <0.03   Thyroid function studies Recent Labs    06/10/17 0035  TSH 2.234   Discussed in detail with patient's spouse and RN at bedside.  Updated care and answered questions.    Time coordinating discharge: Over 30 minutes  SIGNED:  Marcellus Scott, MD, FACP, [facility]. [facility] Pager [phone]  If 7PM-7AM, please contact night-coverage [email or web address] Password [facility] 06/10/2017, 10:56 AM

## 2017-06-10 NOTE — Care Management Note (Signed)
84 yo M was walking with his walker, he tripped over something when he fell forward landing on the carpet, he was unable to get up off the ground because of pain in the right hip. s/p R IM nail intertrochanteric on 04/28/17 due to R hip fx. Per RN, pt is ready to be D/C today and he was receiving HHPT prior to this admission.  Met with pt and wife. He plans to return home with the support of his wife. He is active with [facility]. Wife stated that the therapist is scheduled to see pt tomorrow.

## 2017-06-10 NOTE — Progress Notes (Signed)
Physical Therapy Evaluation Patient Details Name: Elijah Nixon MRN: [id number] DOB: Apr 11, 1933 Today's Date: 06/10/2017   History of Present Illness  Elijah Nixon is a pleasant 84 y/o male admitted on 06/09/17 after a fall within the home. 6 weeks ago with cephalo-medullary nailing of R femur. Patient with a PMH significant for HTN, Grover's disease, alcohol abuse, osteporosis, anemia.  Clinical Impression  Patient admitted with the above listed diagnosis. PTA, patients wife reports he was ambulating within the home with RW. Was receiving HH therapies as patient recently returned home from short term rehab following nailing of R femur due to fall. Patient today reporting he is anxious for transfers and gait which is evident with UE trembling. Demonstrating scissoring gait pattern today, of which both patient and wife reports HHPT has been working on since returning home. Min Guard needed for all transfers and gait for overall patient safety as he is slightly impulsive. Patient at baseline level of functioning with PT to recommend continued HHPT services.     Follow Up Recommendations Home health PT;Supervision/Assistance - 24 hour    Equipment Recommendations  None recommended by PT(has DME at home)    Recommendations for Other Services       Precautions / Restrictions Precautions Precautions: Fall Restrictions Weight Bearing Restrictions: Yes RLE Weight Bearing: Weight bearing as tolerated      Mobility  Bed Mobility Overal bed mobility: Needs Assistance Bed Mobility: Supine to Sit;Sit to Supine     Supine to sit: Supervision Sit to supine: Min assist   General bed mobility comments: Min A for sit to supine for R LE managemtn due to pain from fall  Transfers Overall transfer level: Needs assistance Equipment used: Rolling walker (2 wheeled) Transfers: Sit to/from Stand Sit to Stand: Min guard         General transfer comment: Min guard for safety; Vc to focus on  transfer and slow movements as patient reports he is anxious to be out of bed  Ambulation/Gait Ambulation/Gait assistance: Min guard Ambulation Distance (Feet): 80 Feet Assistive device: Rolling walker (2 wheeled) Gait Pattern/deviations: Decreased weight shift to right;Scissoring;Antalgic;Trunk flexed;Narrow base of support   Gait velocity interpretation: Below normal speed for age/gender General Gait Details: VC to widen BOS and reduce scissoring gait pattern to reduce fall risk. He and wife reports his HHPT has been working on this. Education to slow giat and focus on good stepping pattern as patient is anxious with gait  Stairs            Wheelchair Mobility    Modified Rankin (Stroke Patients Only)       Balance Overall balance assessment: Needs assistance Sitting-balance support: No upper extremity supported;Feet supported Sitting balance-Leahy Scale: Fair     Standing balance support: Bilateral upper extremity supported;During functional activity Standing balance-Leahy Scale: Fair Standing balance comment: reliant on RW                             Pertinent Vitals/Pain Pain Assessment: 0-10 Pain Score: 2  Pain Location: R hip/leg Pain Descriptors / Indicators: Discomfort;Grimacing;Guarding Pain Intervention(s): Limited activity within patient's tolerance;Monitored during session;Repositioned    Home Living Family/patient expects to be discharged to:: Private residence Living Arrangements: Spouse/significant other Available Help at Discharge: Family;Available 24 hours/day Type of Home: House Home Access: Stairs to enter   [employer] of Steps: 1 Home Layout: One level Home Equipment: Walker - 2 wheels;Shower seat - built in;Hand  held shower head;Grab bars - toilet;Grab bars - tub/shower      Prior Function Level of Independence: Independent with assistive device(s)         Comments: ambulating with RW     Hand Dominance         Extremity/Trunk Assessment        Lower Extremity Assessment Lower Extremity Assessment: Generalized weakness       Communication   Communication: No difficulties  Cognition Arousal/Alertness: Awake/alert Behavior During Therapy: WFL for tasks assessed/performed Overall Cognitive Status: Within Functional Limits for tasks assessed                                        General Comments      Exercises     Assessment/Plan    PT Assessment All further PT needs can be met in the next venue of care  PT Problem List Decreased strength;Decreased activity tolerance;Decreased balance;Decreased mobility;Decreased safety awareness       PT Treatment Interventions      PT Goals (Current goals can be found in the Care Plan section)  Acute Rehab PT Goals Patient Stated Goal: return home PT Goal Formulation: With patient Time For Goal Achievement: 06/17/17 Potential to Achieve Goals: Good    Frequency     Barriers to discharge        Co-evaluation               AM-PAC PT "6 Clicks" Daily Activity  Outcome Measure Difficulty turning over in bed (including adjusting bedclothes, sheets and blankets)?: A Little Difficulty moving from lying on back to sitting on the side of the bed? : A Little Difficulty sitting down on and standing up from a chair with arms (e.g., wheelchair, bedside commode, etc,.)?: Unable Help needed moving to and from a bed to chair (including a wheelchair)?: A Little Help needed walking in hospital room?: A Little Help needed climbing 3-5 steps with a railing? : A Lot 6 Click Score: 15    End of Session Equipment Utilized During Treatment: Gait belt Activity Tolerance: Patient tolerated treatment well Patient left: in bed;with call bell/phone within reach;with bed alarm set;with family/visitor present Nurse Communication: Mobility status PT Visit Diagnosis: Unsteadiness on feet (R26.81);Other abnormalities of gait and  mobility (R26.89);History of falling (Z91.81);Muscle weakness (generalized) (M62.81)    Time: 5625-6389 PT Time Calculation (min) (ACUTE ONLY): 19 min   Charges:   PT Evaluation $PT Eval Moderate Complexity: 1 Mod     PT G Codes:   PT G-Codes **NOT FOR INPATIENT CLASS** Functional Assessment Tool Used: AM-PAC 6 Clicks Basic Mobility Functional Limitation: Mobility: Walking and moving around Mobility: Walking and Moving Around Current Status (H7342): At least 40 percent but less than 60 percent impaired, limited or restricted Mobility: Walking and Moving Around Goal Status [id number]): At least 20 percent but less than 40 percent impaired, limited or restricted    Lanney Gins, PT, DPT 06/10/17 11:53 AM

## 2017-06-10 NOTE — Plan of Care (Signed)
  Nutrition: Adequate nutrition will be maintained 06/10/2017 1026 - Progressing by Darrow BussingArcilla, Eryk Beavers M, RN   Elimination: Will not experience complications related to bowel motility 06/10/2017 1026 - Progressing by Darrow BussingArcilla, Ronique Simerly M, RN   Pain Managment: General experience of comfort will improve 06/10/2017 1026 - Progressing by Darrow BussingArcilla, Shalona Harbour M, RN   Safety: Ability to remain free from injury will improve 06/10/2017 1026 - Progressing by Darrow BussingArcilla, Melonie Germani M, RN   Skin Integrity: Risk for impaired skin integrity will decrease 06/10/2017 1026 - Progressing by Darrow BussingArcilla, Lakeysha Slutsky M, RN

## 2017-06-11 ENCOUNTER — Telehealth: Payer: Self-pay

## 2017-06-11 NOTE — Telephone Encounter (Signed)
Wife notified of Dr. Tower's comments and verbalized understanding  

## 2017-06-11 NOTE — Telephone Encounter (Signed)
If the swelling or confusion do not improve- please make his appointment sooner Thanks

## 2017-06-11 NOTE — Telephone Encounter (Signed)
Spoke with wife, Steward DroneBrenda.   Transition Care Management Follow-up Telephone Call   Date discharged? 06/10/17   How have you been since you were released from the hospital? "He's doing okay"   Do you understand why you were in the hospital? Yes. Fall, Anemia, Tachycardia   Do you understand the discharge instructions? yes   Where were you discharged to? Home. Lives with wife. HH for PT to visit.    Items Reviewed:  Medications reviewed: yes  Allergies reviewed: yes  Dietary changes reviewed: yes  Referrals reviewed: yes   Functional Questionnaire:   Activities of Daily Living (ADLs):   He states they are independent in the following: Dressing States they require assistance with the following: Wife assist with most ADLs   Any transportation issues/concerns?: no   Any patient concerns? Yes. Wife reports bilateral leg edema, seeping fluid last night. Currently taking Spironolactone 12.5 mg daily. Also reports increased confusion with hallucinations, worsening at night "for a while now".    Confirmed importance and date/time of follow-up visits scheduled yes  Provider Appointment booked with PCP 06/18/17 @ 1045.   Confirmed with patient if condition begins to worsen call PCP or go to the ER.  Patient was given the office number and encouraged to call back with question or concerns.  : yes

## 2017-06-12 ENCOUNTER — Ambulatory Visit: Payer: Self-pay | Admitting: *Deleted

## 2017-06-12 ENCOUNTER — Telehealth: Payer: Self-pay | Admitting: Family Medicine

## 2017-06-12 NOTE — Telephone Encounter (Signed)
Tamela OddiBetsy PT with home health agency said that Mrs Julien GirtMcDaniel [facility]. I advised Tamela OddiBetsy that Dr Milinda Antisower was asked that question and Dr Milinda Antisower felt it would be best if pts confusion had worsened for pt to go to ED. [name] voiced understanding and will let Mrs Julien GirtMcDaniel know. FYI to Dr Milinda Antisower.

## 2017-06-12 NOTE — Telephone Encounter (Signed)
Copied from CRM [phone]. Topic: Quick Communication - See Telephone Encounter >> Jun 12, 2017 11:08 AM Louie BunPalacios Medina, Rosey Batheresa D wrote: CRM for notification. See Telephone encounter for: 06/12/17. [name] with [facility] call to let Dr. Milinda Antisower know that patient fell on Saturday night and went to the ED. Patient's pain is 3 out of 10 and its on his right leg. Patient is also having hallucination and increase edema at 2 plus bilateral with redness areas. BP is 142/64 and all other vitals in normal limits. She said that Patient was advise to go back to ED but patient would like to come and see Baity instead. Please call [name] back at [phone].

## 2017-06-12 NOTE — Telephone Encounter (Signed)
I spoke with Steward DroneBrenda (DPR signed) [facility]ward [facility] FYI to Dr Milinda Antisower.

## 2017-06-12 NOTE — Telephone Encounter (Signed)
Thanks- I will watch out for records

## 2017-06-12 NOTE — Telephone Encounter (Signed)
Pt's wife called upset that her husband is having hallucinations. They are getting worst. He has them everyday but worst at night. He does not know the year or what day it is. He knows his birthday and the president now.  She is afraid that he is going to hurt himself. She called the office yesterday and was told to call back if he was not any better. She would like an appointment today. Per protocol to see the provider within 4 hours. Flow notified. Pt advised to go to the emergency room per Dr. Milinda Antisower. Wife voiced understanding.  Reason for Disposition . [1] Acting confused (e.g., disoriented, slurred speech) AND [2] brief (now gone)  Answer Assessment - Initial Assessment Questions 1. LEVEL OF CONSCIOUSNESS: "How is he (she, the patient) acting right now?" (e.g., alert-oriented, confused, lethargic, stuporous, comatose)     Calm right now, knows he is at home, knows his birthday, thinks it is Monday, the year 1982 and president Trump 2. ONSET: "When did the confusion start?"  (minutes, hours, days)     Some confusion when he was in the hospital but at night now starting getting worse, seeing things 3. PATTERN "Does this come and go, or has it been constant since it started?"  "Is it present now?"     Hallucinations constant at night and comes and goes during the day 4. ALCOHOL or DRUGS: "Has he been drinking alcohol or taking any drugs?"      Hx of drinking beer, stopped on Jan 5th 5. NARCOTIC MEDICATIONS: "Has he been receiving any narcotic medications?" (e.g., morphine, Vicodin)     No med 6. CAUSE: "What do you think is causing the confusion?"      Not sure, coming off drinking beer 7. OTHER SYMPTOMS: "Are there any other symptoms?" (e.g., difficulty breathing, headache, fever, weakness)     no  Protocols used: CONFUSION - DELIRIUM-A-AH

## 2017-06-14 ENCOUNTER — Telehealth: Payer: Self-pay | Admitting: Family Medicine

## 2017-06-14 NOTE — Telephone Encounter (Signed)
[facility]

## 2017-06-14 NOTE — Telephone Encounter (Signed)
Please ok that verbal order Thanks  

## 2017-06-14 NOTE — Telephone Encounter (Signed)
Copied from CRM [phone]. Topic: Quick Communication - See Telephone Encounter >> Jun 14, 2017 11:00 AM Herby AbrahamJohnson, Shiquita C wrote:   CRM for notification. See Telephone encounter for: Elijah OddiBetsy - [facility] - [phone] - Called in to receive verbal orders to continue PT frequency - 2 times a week for 4 weeks and 1 time a week for 2 weeks. She would also like to add on nursing eval .   Please advise.   06/14/17.

## 2017-06-18 ENCOUNTER — Encounter: Payer: Self-pay | Admitting: Family Medicine

## 2017-06-18 ENCOUNTER — Ambulatory Visit: Payer: Medicare Other | Admitting: Family Medicine

## 2017-06-18 VITALS — BP 94/50 | HR 83 | Temp 95.9°F | Ht 65.0 in | Wt 145.2 lb

## 2017-06-18 DIAGNOSIS — R000 Tachycardia, unspecified: Secondary | ICD-10-CM

## 2017-06-18 DIAGNOSIS — W19XXXA Unspecified fall, initial encounter: Secondary | ICD-10-CM

## 2017-06-18 DIAGNOSIS — I10 Essential (primary) hypertension: Secondary | ICD-10-CM | POA: Diagnosis not present

## 2017-06-18 DIAGNOSIS — Z87898 Personal history of other specified conditions: Secondary | ICD-10-CM | POA: Diagnosis not present

## 2017-06-18 DIAGNOSIS — N400 Enlarged prostate without lower urinary tract symptoms: Secondary | ICD-10-CM | POA: Diagnosis not present

## 2017-06-18 DIAGNOSIS — M25562 Pain in left knee: Secondary | ICD-10-CM

## 2017-06-18 DIAGNOSIS — D500 Iron deficiency anemia secondary to blood loss (chronic): Secondary | ICD-10-CM

## 2017-06-18 DIAGNOSIS — M25462 Effusion, left knee: Secondary | ICD-10-CM | POA: Diagnosis not present

## 2017-06-18 DIAGNOSIS — Z8781 Personal history of (healed) traumatic fracture: Secondary | ICD-10-CM

## 2017-06-18 DIAGNOSIS — R4182 Altered mental status, unspecified: Secondary | ICD-10-CM

## 2017-06-18 DIAGNOSIS — F1011 Alcohol abuse, in remission: Secondary | ICD-10-CM

## 2017-06-18 LAB — CBC WITH DIFFERENTIAL/PLATELET
Basophils Absolute: 0.1 10*3/uL (ref 0.0–0.1)
Basophils Relative: 0.5 % (ref 0.0–3.0)
EOS ABS: 3.2 10*3/uL — AB (ref 0.0–0.7)
Eosinophils Relative: 23.4 % — ABNORMAL HIGH (ref 0.0–5.0)
HCT: 33.6 % — ABNORMAL LOW (ref 39.0–52.0)
HEMOGLOBIN: 11.3 g/dL — AB (ref 13.0–17.0)
Lymphocytes Relative: 10.2 % — ABNORMAL LOW (ref 12.0–46.0)
Lymphs Abs: 1.4 10*3/uL (ref 0.7–4.0)
MCHC: 33.7 g/dL (ref 30.0–36.0)
MCV: 94.4 fl (ref 78.0–100.0)
MONO ABS: 0.6 10*3/uL (ref 0.1–1.0)
Monocytes Relative: 4.3 % (ref 3.0–12.0)
NEUTROS PCT: 61.6 % (ref 43.0–77.0)
Neutro Abs: 8.4 10*3/uL — ABNORMAL HIGH (ref 1.4–7.7)
Platelets: 454 10*3/uL — ABNORMAL HIGH (ref 150.0–400.0)
RBC: 3.56 Mil/uL — AB (ref 4.22–5.81)
RDW: 16 % — ABNORMAL HIGH (ref 11.5–15.5)
WBC: 13.7 10*3/uL — AB (ref 4.0–10.5)

## 2017-06-18 LAB — BASIC METABOLIC PANEL
BUN: 26 mg/dL — AB (ref 6–23)
CO2: 24 mEq/L (ref 19–32)
CREATININE: 0.75 mg/dL (ref 0.40–1.50)
Calcium: 9.1 mg/dL (ref 8.4–10.5)
Chloride: 108 mEq/L (ref 96–112)
GFR: 105.84 mL/min (ref >60.00)
Glucose, Bld: 93 mg/dL (ref 70–99)
POTASSIUM: 4.4 meq/L (ref 3.5–5.1)
Sodium: 139 mEq/L (ref 135–145)

## 2017-06-18 MED ORDER — CEPHALEXIN 500 MG PO CAPS: 500.0000 mg | ORAL_CAPSULE | Freq: Three times a day (TID) | ORAL | 0 refills | Status: DC

## 2017-06-18 NOTE — Assessment & Plan Note (Signed)
Already on alpha blocker  bp is low today-will not adv   More urinary hesitancy lately-pending ua and cx when he can bring sample back

## 2017-06-18 NOTE — Assessment & Plan Note (Signed)
S/p 2nd fall which lead to hospitalization  Thankfully not inj to recently fx hip Reviewed hospital records, lab results and studies in detail  Continues to work intensively with PT /OT  Gait/balance training  Fall precautions- using walker at all times and does not get up without wife there as well  Low threshold for neuro ref if balance problems do not improve Prev etoh likely adds to this

## 2017-06-18 NOTE — Patient Instructions (Addendum)
Keep working with physical therapist for balance and gait training  (gait/balance/strength)   Labs today  Urine specimen today  Take the keflex as directed for knee/ and ?possible urine infection   I think the mental status change is multi factorial -with alcohol changes and also recent illness- I want to rule out infection as well   Cut the amlodipine to 5 mg daily (since bp is low and swelling is worse   We will set you up with orthopedics -see Shirlee LimerickMarion on the way out   Follow up in about a month

## 2017-06-18 NOTE — Assessment & Plan Note (Signed)
S/p hip surgery  On iron  Re check cbc Also elevated wbc in hosp (? If infection or inflammation)

## 2017-06-18 NOTE — Assessment & Plan Note (Signed)
Improved since hospitalization Pulse Rate: 83  No longer drinking etoh as well  Reviewed hospital records, lab results and studies in detail

## 2017-06-18 NOTE — Assessment & Plan Note (Signed)
Hallucinations persist beyond hosp  Likely multifactorial  Nl MS at visit today- mentally clear  Reviewed hospital records, lab results and studies in detail   UA today  Also cover empirically with keflex for redness of knee (? Bursitis) and follow closely  Disc imp of hydration  Cut amlodipine for bp as well  Close f/u

## 2017-06-18 NOTE — Progress Notes (Signed)
Subjective:    Patient ID: Elijah Nixon, male    DOB: [birth year], 84 y.o.   MRN: [id number]  HPI Here for f/u of hosp 2/23 to 06/10/17  Had recent R hip fx and surgery followed by rehab at [facility] place  Pulse Rate: 83  BP: (!) 94/50   Was back at home for 2 weeks -amb with waker  Had unwitnessed fall w/o head inj or LOC Abraded L elbow and had some hip pain   Was getting up out of the chair to get to the couch- fell on buttocks  Found lying flat on back   CT of R hip was reassuring Mildly tachycardic due to pain   A/P scanned from ED  Assessment and plan:  1. Mechanical fall: PT evaluated patient and did well with ambulation and they recommend returning home to resume home health physical therapy.  Patient has a scheduled appointment for tomorrow 06/11/17.  Apart from small superficial abrasion over left elbow, no major injuries noted. 2. Status post cephalo-medullary nailing of right intertrochanteric femur fracture: Orthopedics consultation and follow-up appreciated.  The distal interlock is fractured but remainder of implant is stable.  Recommend weightbearing as tolerated, no role for orthopedic intervention and outpatient follow-up with Dr. Everardo Pacific in 2 weeks. 3. Sinus tachycardia: Likely related to pain.  No arrhythmias noted on telemetry which shows lots of artifacts.  TSH normal.  Troponin x2: Negative.  Patient did not have any cardiorespiratory symptoms.  D-dimer was checked and positive.  This was followed with a CTA chest: No CT evidence of pulmonary embolism.  Resolved. 4. Anemia of chronic disease and iron deficiency: No overt bleeding.  Relatively stable.  Follow CBCs in a few days as outpatient.  Continue iron supplements.  Patient has declined screening colonoscopy as per PCP notes. 5. Essential hypertension: Controlled.  Continue doxazosin and amlodipine. 6. Chronic lower extremity edema: Patient is on Aldactone and in addition to potassium supplements.  Potassium  is normal.  Follow BMP in a few days as outpatient. 7. Alcohol dependence: Patient reported that he quit alcohol about 4 weeks ago.  Encouraged abstinence. 8. 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  Outpatient follow-up with dermatology. 9. Leukocytosis: May be reactive.  No clinical suspicion for infection.  Follow CBCs in a few days as outpatient.  Still not drinking etoh Needs bmp as well as cbc re checked for anemia and leukocytosis   Ct Angio Chest Pe W Or Wo Contrast  Result Date: 06/10/2017 CLINICAL DATA:  84 year old male with concern for PE. EXAM: CT ANGIOGRAPHY CHEST WITH CONTRAST TECHNIQUE: Multidetector CT imaging of the chest was performed using the standard protocol during bolus administration of intravenous contrast. Multiplanar CT image reconstructions and MIPs were obtained to evaluate the vascular anatomy. CONTRAST:  ISOVUE-370 IOPAMIDOL (ISOVUE-370) INJECTION 76% COMPARISON:  Chest radiograph dated 06/09/2017 FINDINGS: Cardiovascular: There is mild cardiomegaly. No pericardial effusion. Multi vessel coronary vascular calcification. Mild atherosclerotic calcification of the thoracic aorta. The origins of the great vessels of the aortic arch are patent. No CT evidence of pulmonary embolism Mediastinum/Nodes: No hilar or mediastinal adenopathy. Esophagus and the thyroid gland are grossly unremarkable. No mediastinal fluid collection. Lungs/Pleura: Small bilateral pleural effusions. Bibasilar linear atelectasis/scarring. There is no focal consolidation, or pneumothorax. The central airways are patent. Upper Abdomen: No acute abnormality. Musculoskeletal: Degenerative changes of the spine. No acute osseous pathology. Review of the MIP images confirms the above findings. IMPRESSION: 1. No  CT evidence of pulmonary embolism. 2. Small bilateral pleural effusions. 3. Mild cardiomegaly and coronary vascular calcification. Electronically Signed    By: Elgie Collard M.D.   On: 06/10/2017 05:27   Ct Hip Right Wo Contrast  Result Date: 06/09/2017 CLINICAL DATA:  Fall, prior hip surgery EXAM: CT OF THE RIGHT HIP WITHOUT CONTRAST TECHNIQUE: Multidetector CT imaging of the right hip was performed according to the standard protocol. Multiplanar CT image reconstructions were also generated. COMPARISON:  Radiographs 06/09/2017, 04/28/2017 FINDINGS: Bones/Joint/Cartilage Patient is status post intramedullary rodding of the proximal right femur across comminuted intertrochanteric fracture. Displaced lesser trochanteric fracture fragments with developing callus. Multiple fracture fragments at the cephalad aspect of the vertical femoral rod. Multiple displaced cortical bone fragments at the proximal shaft of the femur posteriorly. Obliquely oriented cortical bone fragment anteriorly at the base of the femoral neck. The rod traversing the femoral head and neck penetrates the lateral cortex of the femur and protrudes into the posterolateral soft tissues, this appears increased compared to radiograph 04/28/2016. Slight increased shortening of the femoral neck since the postoperative radiograph. No discrete lucencies around the femoral rod. No fracture through the hardware. Pubic symphysis and rami are intact. Ligaments Suboptimally assessed by CT. Muscles and Tendons No intramuscular hematoma. Soft tissues Soft tissue thickening and minimal fluid lateral to the trochanter, may reflect resolving postoperative fluid or hematoma. Skin thickening and subcutaneous edema are noted. IMPRESSION: 1. Status post intramedullary rodding of comminuted intertrochanteric fracture with displaced lesser trochanteric fracture fragment. Callus is forming at the fracture site. There appears to be slightly more lateral positioning of the rod traversing the femoral head and neck with the lateral aspect protruding into the soft tissues, compared to prior radiographs. There appears to be  some increased foreshortening/impaction at the femoral neck compared to the postoperative radiograph. Surgical hardware otherwise appears intact and without surrounding lucency. Electronically Signed   By: Jasmine Pang M.D.   On: 06/09/2017 22:36   Dg Chest Port 1 View  Result Date: 06/09/2017 CLINICAL DATA:  Fall. EXAM: PORTABLE CHEST 1 VIEW COMPARISON:  04/28/2017 and prior radiographs FINDINGS: The cardiomediastinal silhouette is unremarkable. There is no evidence of focal airspace disease, pulmonary edema, suspicious pulmonary nodule/mass, pleural effusion, or pneumothorax. No acute bony abnormalities are identified. IMPRESSION: No active disease. Electronically Signed   By: Harmon Pier M.D.   On: 06/09/2017 19:32   Dg Hip Unilat W Or Wo Pelvis 2-3 Views Right  Result Date: 06/09/2017 CLINICAL DATA:  Acute LEFT hip pain following fall today. Initial encounter. EXAM: DG HIP (WITH OR WITHOUT PELVIS) 2-3V RIGHT COMPARISON:  04/28/2017 radiographs. FINDINGS: Intramedullary rod and screws within the RIGHT femur noted. The distal transfixing screw within the distal femur is fractured-age indeterminate. No acute fracture, subluxation or dislocation identified. Remote proximal RIGHT femur fracture with displaced fragment and heterotopic ossification noted. IMPRESSION: 1. Fracture of the distal transfixing screw of the intramedullary rod within the distal femur-age indeterminate. 2. Remote fracture of the proximal RIGHT femur without definite acute abnormality. Electronically Signed   By: Harmon Pier M.D.   On: 06/09/2017 19:32   Re assuring radiology findings  Hip stable No PE (after pos DD)  Wt Readings from Last 3 Encounters:  06/18/17 145 lb 4 oz (65.9 kg)  06/10/17 141 lb 15.6 oz (64.4 kg)  05/30/17 138 lb 8 oz (62.8 kg)   24.17 kg/m   bp is lower today  No cp or palpitations or headaches or edema  No side  effects to medicines  BP Readings from Last 3 Encounters:  06/18/17 (!) 94/50    06/10/17 123/68  05/30/17 (!) 120/54      Lab Results  Component Value Date   CREATININE 0.81 06/10/2017   BUN 18 06/10/2017   NA 138 06/10/2017   K 3.8 06/10/2017   CL 108 06/10/2017   CO2 21 (L) 06/10/2017   Lab Results  Component Value Date   ALT 23 06/10/2017   AST 31 06/10/2017   ALKPHOS 149 (H) 06/10/2017   BILITOT 0.6 06/10/2017   Lab Results  Component Value Date   WBC 15.0 (H) 06/10/2017   HGB 10.6 (L) 06/10/2017   HCT 32.0 (L) 06/10/2017   MCV 95.8 06/10/2017   PLT 428 (H) 06/10/2017    Lab Results  Component Value Date   CKTOTAL 85 05/05/2015   TROPONINI <0.03 06/10/2017   no acute changes on EKG  No falls since he came home  Walking is not steady - still working aggressively with PT for balance/gait and strength   L knee is bothering him  Is red - 4-5 days?  Hurts to bear weight on it  Has had cortisone shot in that knee  Does not think he fell on it   Saw ortho a week ago  Goes back on April 5 th   Legs are still swollen / dependent edema  bp is lower today   Hallucinations at night (feels bugs) Now occ during the day-sees people/dogs  Wife is not sure if this is from prev etoh abuse or something more is adding No ua was done in hosp He has had more trouble passing urine at home  ? Poss uti  No dysuria and no fever    PT is coming to the house -working hard on balance  Wife goes with him everywhere for now  He was initially doing very well after hip surgery   Needs bmet to check K- if continued needs K refilled  Also re check cbc (wbc inc in hospital_  Patient Active Problem List   Diagnosis Date Noted  . Pain and swelling of left knee 06/18/2017  . Change in mental status 06/18/2017  . Tachycardia 06/09/2017  . Fall 06/09/2017  . S/P right hip fracture 04/28/2017  . Hyponatremia 04/28/2017  . Prediabetes 10/17/2016  . Anemia 10/17/2016  . 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 BPH (benign prostatic hyperplasia) 10/07/2016  . Lumbar  disc disease 05/06/2015  . Routine general medical examination at a health care facility 10/02/2014  . Colon cancer screening 09/29/2013  . Encounter for Medicare annual wellness exam 09/27/2012  . Seborrheic keratosis 03/29/2012  . SWELLING, MASS, OR LUMP IN CHEST 06/24/2009  . ERECTILE DYSFUNCTION 02/16/2009  . ACTINIC KERATOSIS 02/16/2009  . Alcohol abuse 10/27/2008  . Essential hypertension 10/27/2008  . GEN OSTEOARTHROSIS INVOLVING MULTIPLE SITES 10/27/2008  . Osteoporosis 10/27/2008  . COLONIC POLYPS, HX OF 10/27/2008  . DIVERTICULITIS, HX OF 10/27/2008   Past Medical History:  Diagnosis Date  . AA (alcohol abuse)    at least 8-10 drinks/day  . Arthritis    OA  . DDD (degenerative disc disease)   . ED (erectile dysfunction)   . Grover's disease   . History of colon polyps   . History of diverticulitis of colon   . Hypertension   . Macular degeneration    dry  . Osteoporosis    spinal compression fx   Past Surgical History:  Procedure Laterality Date  .  BACK SURGERY    . INTRAMEDULLARY (IM) NAIL INTERTROCHANTERIC Right 04/28/2017   Procedure: INTRAMEDULLARY (IM) NAIL INTERTROCHANTRIC;  Surgeon: Bjorn Pippin, MD;  Location: [facility] OR;  Service: Orthopedics;  Laterality: Right;  . SPINE SURGERY  1971   deg disc disease x 2   Social History   Tobacco Use  . Smoking status: Never Smoker  . Smokeless tobacco: Never Used  Substance Use Topics  . Alcohol use: Yes    Alcohol/week: 6.0 oz    Types: 10 Cans of beer per week    Comment: 10 cans throughout the day   . Drug use: No   Family History  Problem Relation Age of Onset  . Diabetes Son   . Dementia Mother    Allergies  Allergen Reactions  . Lisinopril     REACTION: increased potassium leveles   Current Outpatient Medications on File Prior to Visit  Medication Sig Dispense Refill  . amLODipine (NORVASC) 10 MG tablet TAKE 1 TABLET BY MOUTH DAILY (Patient taking differently: Take 5 mg by mouth daily. ) 90  tablet 3  . aspirin 81 MG tablet Take 81 mg by mouth daily.      Marland Kitchen b complex vitamins tablet Take 1 tablet by mouth daily.    . Calcium Carbonate-Vit D-Min 600-400 MG-UNIT TABS Take 1 tablet by mouth daily.     . Cyanocobalamin (VITAMIN B-12 PO) Take 1 tablet by mouth daily.    Marland Kitchen doxazosin (CARDURA) 8 MG tablet TAKE ONE (1) TABLET BY MOUTH EVERY DAY (Patient taking differently: Take 8 mg by mouth daily. ) 90 tablet 3  . ferrous sulfate 325 (65 FE) MG tablet Take 1 tablet (325 mg total) by mouth 3 (three) times daily with meals. 60 tablet 3  . folic acid (FOLVITE) 1 MG tablet Take 1 tablet (1 mg total) by mouth daily. 30 tablet 2  . ibuprofen (ADVIL,MOTRIN) 200 MG tablet Take 200 mg by mouth every 6 (six) hours as needed for fever or headache.    . Multiple Vitamins-Minerals (PRESERVISION/LUTEIN) CAPS Take 1 capsule by mouth daily.      . potassium chloride SA (K-DUR,KLOR-CON) 20 MEQ tablet Take 2 tablets (40 mEq total) by mouth 2 (two) times daily. 30 tablet 1  . spironolactone (ALDACTONE) 25 MG tablet Take 0.5 tablets (12.5 mg total) by mouth daily. 90 tablet 3   No current facility-administered medications on file prior to visit.      Review of Systems  Constitutional: Positive for fatigue. Negative for activity change, appetite change, fever and unexpected weight change.  HENT: Negative for congestion, rhinorrhea, sore throat and trouble swallowing.   Eyes: Negative for pain, redness, itching and visual disturbance.  Respiratory: Negative for cough, chest tightness, shortness of breath and wheezing.   Cardiovascular: Negative for chest pain and palpitations.  Gastrointestinal: Negative for abdominal pain, blood in stool, constipation, diarrhea and nausea.  Endocrine: Negative for cold intolerance, heat intolerance, polydipsia and polyuria.  Genitourinary: Positive for difficulty urinating and frequency. Negative for decreased urine volume, dysuria and urgency.  Musculoskeletal: Positive  for arthralgias. Negative for joint swelling and myalgias.  Skin: Negative for pallor and rash.  Neurological: Positive for weakness and light-headedness. Negative for dizziness, tremors, numbness and headaches.       Poor balance  Abn gait-shuffling  Hematological: Negative for adenopathy. Does not bruise/bleed easily.  Psychiatric/Behavioral: Positive for hallucinations and sleep disturbance. Negative for agitation, decreased concentration, dysphoric mood and suicidal ideas. The patient is not nervous/anxious.  Objective:   Physical Exam  Constitutional: He appears well-developed and well-nourished. No distress.  Frail appearing elderly male in wheelchair   HENT:  Head: Normocephalic and atraumatic.  Mouth/Throat: Oropharynx is clear and moist.  Eyes: Conjunctivae and EOM are normal. Pupils are equal, round, and reactive to light. Right eye exhibits no discharge. Left eye exhibits no discharge. No scleral icterus.  Neck: Normal range of motion. Neck supple. No JVD present. Carotid bruit is not present. No thyromegaly present.  Cardiovascular: Normal rate, regular rhythm, normal heart sounds and intact distal pulses. Exam reveals no gallop.  Pulmonary/Chest: Effort normal and breath sounds normal. No respiratory distress. He has no wheezes. He has no rales.  No crackles  Good air exch   Abdominal: Soft. Bowel sounds are normal. He exhibits no distension, no abdominal bruit and no mass. There is no tenderness. There is no rebound and no guarding.  Musculoskeletal: He exhibits tenderness. He exhibits no edema.  3 cm area of erythema and warmth with mild swelling on top of L knee  Not fluctuant  No effusion evident No skin breakdown    Lymphadenopathy:    He has no cervical adenopathy.  Neurological: He is alert. He has normal reflexes. No cranial nerve deficit. Coordination abnormal.  Minimal hand tremor today  Skin: Skin is warm and dry. No rash noted. No pallor.  Redness  over L knee  Psychiatric: He has a normal mood and affect. His speech is normal and behavior is normal. His mood appears not anxious. His affect is not blunt and not labile. Thought content is not paranoid and not delusional.  Mood and affect are nl today  Mentally clear and attentive             Assessment & Plan:   Problem List Items Addressed This Visit      Cardiovascular and Mediastinum   Essential hypertension    bp is low today- not optimal given balance problems Also still exp LE edema Will cut amlodipine to 5 mg daily  F/u 1 mo or earlier if needed  Reviewed hospital records, lab results and studies in detail        Relevant Orders   Basic metabolic panel     Genitourinary   BPH (benign prostatic hyperplasia)    Already on alpha blocker  bp is low today-will not adv   More urinary hesitancy lately-pending ua and cx when he can bring sample back         Other   Anemia    S/p hip surgery  On iron  Re check cbc Also elevated wbc in hosp (? If infection or inflammation)       Relevant Orders   CBC with Differential/Platelet   Change in mental status - Primary    Hallucinations persist beyond hosp  Likely multifactorial  Nl MS at visit today- mentally clear  Reviewed hospital records, lab results and studies in detail   UA today  Also cover empirically with keflex for redness of knee (? Bursitis) and follow closely  Disc imp of hydration  Cut amlodipine for bp as well  Close f/u        Fall    S/p 2nd fall which lead to hospitalization  Thankfully not inj to recently fx hip Reviewed hospital records, lab results and studies in detail  Continues to work intensively with PT /OT  Gait/balance training  Fall precautions- using walker at all times and does not get up without wife  there as well  Low threshold for neuro ref if balance problems do not improve Prev etoh likely adds to this      H/O alcohol abuse    Abstinent now for 2 mo  Unclear if  his pm hallucinations are related to this  Commended on quitting        Pain and swelling of left knee    Does not resemble gout- more superficial Denies trauma  Pre patellar bursitis is possible Has OA in the knee with injections in the past Had inc wbc in hospital   (Reviewed hospital records, lab results and studies in detail ) Will empirically cover with keflex and get back to orthopedics asap       Relevant Orders   CBC with Differential/Platelet   Ambulatory referral to Orthopedic Surgery   S/P right hip fracture    Recent fall did not result in more injury fortunately  Reviewed hospital records, lab results and studies in detail   Continue PT and f/u with orthopedics Most imp to prevent falls      Tachycardia    Improved since hospitalization Pulse Rate: 83  No longer drinking etoh as well  Reviewed hospital records, lab results and studies in detail

## 2017-06-18 NOTE — Assessment & Plan Note (Signed)
bp is low today- not optimal given balance problems Also still exp LE edema Will cut amlodipine to 5 mg daily  F/u 1 mo or earlier if needed  Reviewed hospital records, lab results and studies in detail

## 2017-06-18 NOTE — Assessment & Plan Note (Addendum)
Abstinent now for 2 mo  Unclear if his pm hallucinations are related to this  Commended on quitting

## 2017-06-18 NOTE — Assessment & Plan Note (Signed)
Does not resemble gout- more superficial Denies trauma  Pre patellar bursitis is possible Has OA in the knee with injections in the past Had inc wbc in hospital   (Reviewed hospital records, lab results and studies in detail ) Will empirically cover with keflex and get back to orthopedics asap

## 2017-06-18 NOTE — Assessment & Plan Note (Signed)
Recent fall did not result in more injury fortunately  Reviewed hospital records, lab results and studies in detail   Continue PT and f/u with orthopedics Most imp to prevent falls

## 2017-06-19 ENCOUNTER — Telehealth: Payer: Self-pay | Admitting: *Deleted

## 2017-06-19 ENCOUNTER — Telehealth: Payer: Self-pay

## 2017-06-19 ENCOUNTER — Encounter: Payer: Self-pay | Admitting: Family Medicine

## 2017-06-19 DIAGNOSIS — R339 Retention of urine, unspecified: Secondary | ICD-10-CM

## 2017-06-19 DIAGNOSIS — R296 Repeated falls: Secondary | ICD-10-CM | POA: Insufficient documentation

## 2017-06-19 DIAGNOSIS — N400 Enlarged prostate without lower urinary tract symptoms: Secondary | ICD-10-CM

## 2017-06-19 LAB — POC URINALSYSI DIPSTICK (AUTOMATED)
Bilirubin, UA: NEGATIVE
Glucose, UA: NEGATIVE
Ketones, UA: NEGATIVE
Leukocytes, UA: NEGATIVE
Nitrite, UA: NEGATIVE
PROTEIN UA: NEGATIVE
RBC UA: NEGATIVE
SPEC GRAV UA: 1.025 (ref 1.010–1.025)
UROBILINOGEN UA: 0.2 U/dL
pH, UA: 6 (ref 5.0–8.0)

## 2017-06-19 NOTE — Telephone Encounter (Signed)
Order is written and in IN box

## 2017-06-19 NOTE — Telephone Encounter (Signed)
>>   Jun 19, 2017  3:42 PM Elliot GaultBell, Tiffany M wrote: Jethro Bolusaller name: Elijah DresserConnie  Relation to pt: RN from [facility]  Call back number: [phone]    Reason for call:  Requesting verbal orders for skilled nursing 2x 2, 1x 4. Inquiring if there's anything patient can take for Grover's disease. Skin irration keeping patient up during the night. The cream prescribed is given temporary relief but not working. Patient had 5 cups of water and urinated only 1x, please advise

## 2017-06-19 NOTE — Telephone Encounter (Signed)
Copied from CRM [id number]. Topic: Inquiry >> Jun 19, 2017 12:47 PM Alexander BergeronBarksdale, Harvey B wrote: Reason for CRM: Tamela OddiBetsy the PT called and states the pt had a fall last night, no pain, pt denies any injuries [phone] is [name]'s number contact if needed

## 2017-06-19 NOTE — Telephone Encounter (Signed)
Please ok the verbal order  Not sure about the skin treatment- adv her to call the dermatology office he goes to   We may need to have him see urology for the urinary retention (? May be prostate related) - is it ok if I work on a referral?

## 2017-06-19 NOTE — Telephone Encounter (Signed)
Copied from CRM [id number]. Topic: Inquiry >> Jun 19, 2017  1:30 PM Yvonna Alanisobinson, Andra M wrote: Reason for CRM: Patient's wife called requesting an order for a wheelchair. Patient's physical therapist recommends a wheelchair for patient at home. Patient has had two falls using his walker. Patient's wife would like Dr. Milinda Antisower to write an order for a wheelchair.       Thank You!!!

## 2017-06-19 NOTE — Telephone Encounter (Signed)
Thanks for letting me know- I think his wife is staying by his side whenever ambulating at this point  Keep working on fall precautions

## 2017-06-19 NOTE — Addendum Note (Signed)
Addended by: Shon MilletWATLINGTON, SHAPALE M on: 06/19/2017 02:20 PM   Modules accepted: Orders

## 2017-06-20 DIAGNOSIS — R339 Retention of urine, unspecified: Secondary | ICD-10-CM | POA: Insufficient documentation

## 2017-06-20 LAB — URINE CULTURE
MICRO NUMBER:: 90281765
RESULT: NO GROWTH
SPECIMEN QUALITY:: ADEQUATE

## 2017-06-20 NOTE — Telephone Encounter (Signed)
Ref done  Will route to [facility] 

## 2017-06-20 NOTE — Telephone Encounter (Signed)
Spoke with wife and she will call pt's dermatologist to f/u with the skin issues and she does want Dr. Milinda Antisower to refer him to a urologist in [place] if possible for his urinary issues. I advise [facility]  Randol Kernalled Connie with [facility] and left VM giving her the verbal order for the skilled nursing

## 2017-06-20 NOTE — Telephone Encounter (Signed)
Wife notified order is ready for pick up

## 2017-06-21 ENCOUNTER — Telehealth: Payer: Self-pay | Admitting: Family Medicine

## 2017-06-21 DIAGNOSIS — R4182 Altered mental status, unspecified: Secondary | ICD-10-CM

## 2017-06-21 DIAGNOSIS — R296 Repeated falls: Secondary | ICD-10-CM

## 2017-06-21 NOTE — Telephone Encounter (Signed)
Taking care of this, see referral notes. Thank Neta Mendsyou-Estle Huguley V Eustolia Drennen, RMA

## 2017-06-21 NOTE — Telephone Encounter (Signed)
-----   Message from Shon MilletShapale M [place], [place] sent at 06/21/2017 11:59 AM EST ----- Spoke with wife and she advise me that pt is still having hallucinations during the day and at night. Wife said since pt didn't have a UTI you had mentioned that the next step would be a referral to neuro. Wife does want to proceed with that if Dr. Milinda Antisower is okay with it. They would want to see a neurologist in [place] if possible. I advise them as long as Dr. Milinda Antisower is okay with it she will put referral in and [facility]  Wife said pt hasn't had anymore falls since last one that's in Epic (hospital visit). Pt doesn't move around much due to him being afraid he is going to fall again. PT came out yesterday and said pt is more weaker then last time since having his 2nd fall. Pt does see the surgeon today about his hip and wife will show them his knee too but she said it looks a lot better it's not as red and swollen today.

## 2017-06-21 NOTE — Telephone Encounter (Signed)
Glad the knee is improved I will do the neuro ref and [facility] Thanks

## 2017-06-21 NOTE — Telephone Encounter (Signed)
Looking into this, see referral message notes-Anastasiya V Hopkins, RMA

## 2017-06-22 ENCOUNTER — Telehealth: Payer: Self-pay | Admitting: *Deleted

## 2017-06-22 NOTE — Telephone Encounter (Signed)
Copied from CRM [id number]. Topic: General - Other >> Jun 22, 2017 11:53 AM Elliot GaultBell, Tiffany M wrote: Caller name: Aurea GraffJoan  Relation to pt:  OT from [facility]  Call back number: [phone]    Reason for call:  OT assessment appointment had o be canceled today due to patient lower extremities being swollen and patient experiencing hallucinations therefore wife will take patient to the hospital (OT unaware of what hospital).   Attempt OT assessment next week and wanted to make PCP aware >> Jun 22, 2017 11:59 AM Elliot GaultBell, Tiffany M wrote: Jethro Bolusaller name: Aurea GraffJoan  Relation to pt:  OT from [facility]  Call back number: [phone]    Reason for call:  OT assessment appointment hadt to be canceled today due to patient lower extremities being swollen and patient experiencing hallucinations.Wife will take patient to the hospital (OT unaware of what hospital).   Attempt OT assessment next week and wanted to make PCP aware

## 2017-06-22 NOTE — Telephone Encounter (Signed)
Thanks for letting me know  I agree with choice to take him and will watch his chart for notes from ED

## 2017-06-23 ENCOUNTER — Other Ambulatory Visit: Payer: Self-pay

## 2017-06-23 ENCOUNTER — Emergency Department (HOSPITAL_COMMUNITY): Payer: Medicare Other

## 2017-06-23 ENCOUNTER — Inpatient Hospital Stay (HOSPITAL_COMMUNITY)
Admission: EM | Admit: 2017-06-23 | Discharge: 2017-06-29 | DRG: 896 | Disposition: A | Discharge status: Hospice | Payer: Medicare Other | Attending: Nephrology | Admitting: Nephrology

## 2017-06-23 DIAGNOSIS — E43 Unspecified severe protein-calorie malnutrition: Secondary | ICD-10-CM | POA: Diagnosis present

## 2017-06-23 DIAGNOSIS — T68XXXA Hypothermia, initial encounter: Secondary | ICD-10-CM

## 2017-06-23 DIAGNOSIS — Z66 Do not resuscitate: Secondary | ICD-10-CM | POA: Diagnosis present

## 2017-06-23 DIAGNOSIS — D72829 Elevated white blood cell count, unspecified: Secondary | ICD-10-CM

## 2017-06-23 DIAGNOSIS — R296 Repeated falls: Secondary | ICD-10-CM | POA: Diagnosis present

## 2017-06-23 DIAGNOSIS — F10259 Alcohol dependence with alcohol-induced psychotic disorder, unspecified: Secondary | ICD-10-CM | POA: Diagnosis present

## 2017-06-23 DIAGNOSIS — F10251 Alcohol dependence with alcohol-induced psychotic disorder with hallucinations: Secondary | ICD-10-CM | POA: Diagnosis not present

## 2017-06-23 DIAGNOSIS — Z833 Family history of diabetes mellitus: Secondary | ICD-10-CM

## 2017-06-23 DIAGNOSIS — E876 Hypokalemia: Secondary | ICD-10-CM | POA: Diagnosis present

## 2017-06-23 DIAGNOSIS — E870 Hyperosmolality and hypernatremia: Secondary | ICD-10-CM | POA: Diagnosis present

## 2017-06-23 DIAGNOSIS — R4701 Aphasia: Secondary | ICD-10-CM | POA: Diagnosis present

## 2017-06-23 DIAGNOSIS — D721 Eosinophilia: Secondary | ICD-10-CM | POA: Diagnosis present

## 2017-06-23 DIAGNOSIS — R410 Disorientation, unspecified: Secondary | ICD-10-CM | POA: Diagnosis not present

## 2017-06-23 DIAGNOSIS — Z7982 Long term (current) use of aspirin: Secondary | ICD-10-CM

## 2017-06-23 DIAGNOSIS — R627 Adult failure to thrive: Secondary | ICD-10-CM

## 2017-06-23 DIAGNOSIS — I10 Essential (primary) hypertension: Secondary | ICD-10-CM | POA: Diagnosis present

## 2017-06-23 DIAGNOSIS — F10951 Alcohol use, unspecified with alcohol-induced psychotic disorder with hallucinations: Secondary | ICD-10-CM

## 2017-06-23 DIAGNOSIS — R52 Pain, unspecified: Secondary | ICD-10-CM

## 2017-06-23 DIAGNOSIS — E860 Dehydration: Secondary | ICD-10-CM | POA: Diagnosis present

## 2017-06-23 DIAGNOSIS — J690 Pneumonitis due to inhalation of food and vomit: Secondary | ICD-10-CM | POA: Diagnosis not present

## 2017-06-23 DIAGNOSIS — G9341 Metabolic encephalopathy: Secondary | ICD-10-CM | POA: Diagnosis present

## 2017-06-23 DIAGNOSIS — H3530 Unspecified macular degeneration: Secondary | ICD-10-CM | POA: Diagnosis present

## 2017-06-23 DIAGNOSIS — L111 Transient acantholytic dermatosis [Grover]: Secondary | ICD-10-CM | POA: Diagnosis present

## 2017-06-23 DIAGNOSIS — Z6824 Body mass index (BMI) 24.0-24.9, adult: Secondary | ICD-10-CM

## 2017-06-23 DIAGNOSIS — E0781 Sick-euthyroid syndrome: Secondary | ICD-10-CM | POA: Diagnosis present

## 2017-06-23 DIAGNOSIS — F1021 Alcohol dependence, in remission: Secondary | ICD-10-CM

## 2017-06-23 DIAGNOSIS — Z515 Encounter for palliative care: Secondary | ICD-10-CM | POA: Diagnosis present

## 2017-06-23 LAB — COMPREHENSIVE METABOLIC PANEL
ALBUMIN: 2.6 g/dL — AB (ref 3.5–5.0)
ALK PHOS: 157 U/L — AB (ref 38–126)
ALT: 54 U/L (ref 17–63)
AST: 46 U/L — AB (ref 15–41)
Anion gap: 10 (ref 5–15)
BILIRUBIN TOTAL: 0.5 mg/dL (ref 0.3–1.2)
BUN: 17 mg/dL (ref 6–20)
CALCIUM: 8.7 mg/dL — AB (ref 8.9–10.3)
CO2: 18 mmol/L — AB (ref 22–32)
CREATININE: 0.84 mg/dL (ref 0.61–1.24)
Chloride: 111 mmol/L (ref 101–111)
GFR calc Af Amer: >60 mL/min (ref >60)
GFR calc non Af Amer: >60 mL/min (ref >60)
GLUCOSE: 92 mg/dL (ref 65–99)
Potassium: 4.4 mmol/L (ref 3.5–5.1)
SODIUM: 139 mmol/L (ref 135–145)
TOTAL PROTEIN: 5.8 g/dL — AB (ref 6.5–8.1)

## 2017-06-23 LAB — URINALYSIS, ROUTINE W REFLEX MICROSCOPIC
Bilirubin Urine: NEGATIVE
GLUCOSE, UA: NEGATIVE mg/dL
HGB URINE DIPSTICK: NEGATIVE
Ketones, ur: 5 mg/dL — AB
Leukocytes, UA: NEGATIVE
Nitrite: NEGATIVE
PROTEIN: NEGATIVE mg/dL
SPECIFIC GRAVITY, URINE: 1.02 (ref 1.005–1.030)
pH: 5 (ref 5.0–8.0)

## 2017-06-23 LAB — ACETAMINOPHEN LEVEL: Acetaminophen (Tylenol), Serum: <10 ug/mL — ABNORMAL LOW (ref 10–30)

## 2017-06-23 LAB — AMMONIA: Ammonia: 18 umol/L (ref 9–35)

## 2017-06-23 LAB — CBC WITH DIFFERENTIAL/PLATELET
BASOS ABS: 0 10*3/uL (ref 0.0–0.1)
Basophils Relative: 0 %
EOS ABS: 3.1 10*3/uL — AB (ref 0.0–0.7)
Eosinophils Relative: 26 %
HEMATOCRIT: 30.4 % — AB (ref 39.0–52.0)
HEMOGLOBIN: 11 g/dL — AB (ref 13.0–17.0)
LYMPHS PCT: 11 %
Lymphs Abs: 1.3 10*3/uL (ref 0.7–4.0)
MCH: 34.6 pg — ABNORMAL HIGH (ref 26.0–34.0)
MCHC: 36.2 g/dL — AB (ref 30.0–36.0)
MCV: 95.6 fL (ref 78.0–100.0)
Monocytes Absolute: 0.5 10*3/uL (ref 0.1–1.0)
Monocytes Relative: 4 %
NEUTROS ABS: 7.2 10*3/uL (ref 1.7–7.7)
Neutrophils Relative %: 59 %
Platelets: 418 10*3/uL — ABNORMAL HIGH (ref 150–400)
RBC: 3.18 MIL/uL — ABNORMAL LOW (ref 4.22–5.81)
RDW: 15.6 % — ABNORMAL HIGH (ref 11.5–15.5)
WBC: 12.1 10*3/uL — ABNORMAL HIGH (ref 4.0–10.5)

## 2017-06-23 LAB — BRAIN NATRIURETIC PEPTIDE: B NATRIURETIC PEPTIDE 5: 58.5 pg/mL (ref 0.0–100.0)

## 2017-06-23 LAB — TSH: TSH: 5.441 u[IU]/mL — AB (ref 0.350–4.500)

## 2017-06-23 LAB — I-STAT CG4 LACTIC ACID, ED: Lactic Acid, Venous: 0.81 mmol/L (ref 0.5–1.9)

## 2017-06-23 LAB — I-STAT TROPONIN, ED: Troponin i, poc: 0 ng/mL (ref 0.00–0.08)

## 2017-06-23 LAB — MRSA PCR SCREENING: MRSA by PCR: NEGATIVE

## 2017-06-23 LAB — SALICYLATE LEVEL

## 2017-06-23 MED ORDER — SODIUM CHLORIDE 0.9% FLUSH
3.0000 mL | Freq: Two times a day (BID) | INTRAVENOUS | Status: DC
  Administered 2017-06-24 – 2017-06-26 (×3): 3 mL via INTRAVENOUS

## 2017-06-23 MED ORDER — ONDANSETRON HCL 4 MG PO TABS: 4.0000 mg | ORAL_TABLET | Freq: Four times a day (QID) | ORAL | Status: DC | PRN

## 2017-06-23 MED ORDER — QUETIAPINE FUMARATE 25 MG PO TABS
25.0000 mg | ORAL_TABLET | Freq: Every day | ORAL | Status: DC
  Administered 2017-06-23: 25 mg via ORAL
  Filled 2017-06-23: qty 1

## 2017-06-23 MED ORDER — DOXAZOSIN MESYLATE 8 MG PO TABS
8.0000 mg | ORAL_TABLET | Freq: Every day | ORAL | Status: DC
  Administered 2017-06-24 – 2017-06-26 (×3): 8 mg via ORAL
  Filled 2017-06-23 (×5): qty 1

## 2017-06-23 MED ORDER — CALCIUM CARBONATE-VITAMIN D 500-200 MG-UNIT PO TABS
1.0000 | ORAL_TABLET | Freq: Every day | ORAL | Status: DC
  Administered 2017-06-25 – 2017-06-26 (×2): 1 via ORAL
  Filled 2017-06-23 (×3): qty 1

## 2017-06-23 MED ORDER — SODIUM CHLORIDE 0.9 % IV BOLUS (SEPSIS)
1000.0000 mL | Freq: Once | INTRAVENOUS | Status: AC
  Administered 2017-06-23: 1000 mL via INTRAVENOUS

## 2017-06-23 MED ORDER — POLYETHYLENE GLYCOL 3350 17 G PO PACK
17.0000 g | PACK | Freq: Every day | ORAL | Status: DC | PRN
  Administered 2017-06-24: 17 g via ORAL
  Filled 2017-06-23: qty 1

## 2017-06-23 MED ORDER — ACETAMINOPHEN 650 MG RE SUPP: 650.0000 mg | Freq: Four times a day (QID) | RECTAL | Status: DC | PRN

## 2017-06-23 MED ORDER — ONDANSETRON HCL 4 MG/2ML IJ SOLN: 4.0000 mg | Freq: Four times a day (QID) | INTRAMUSCULAR | Status: DC | PRN

## 2017-06-23 MED ORDER — SPIRONOLACTONE 12.5 MG HALF TABLET
12.5000 mg | ORAL_TABLET | Freq: Every day | ORAL | Status: DC
  Filled 2017-06-23: qty 1

## 2017-06-23 MED ORDER — B COMPLEX PO TABS: 1.0000 | ORAL_TABLET | Freq: Every day | ORAL | Status: DC

## 2017-06-23 MED ORDER — PRESERVISION/LUTEIN PO CAPS: 1.0000 | ORAL_CAPSULE | Freq: Every day | ORAL | Status: DC

## 2017-06-23 MED ORDER — OXYCODONE HCL 5 MG PO TABS: 5.0000 mg | ORAL_TABLET | ORAL | Status: DC | PRN

## 2017-06-23 MED ORDER — FOLIC ACID 1 MG PO TABS
1.0000 mg | ORAL_TABLET | Freq: Every day | ORAL | Status: DC
  Administered 2017-06-25 – 2017-06-26 (×2): 1 mg via ORAL
  Filled 2017-06-23 (×2): qty 1

## 2017-06-23 MED ORDER — ASPIRIN 81 MG PO TABS
81.0000 mg | ORAL_TABLET | Freq: Every day | ORAL | Status: DC
  Filled 2017-06-23: qty 1

## 2017-06-23 MED ORDER — LORAZEPAM 2 MG/ML IJ SOLN
1.0000 mg | Freq: Once | INTRAMUSCULAR | Status: AC
  Administered 2017-06-23: 1 mg via INTRAVENOUS
  Filled 2017-06-23: qty 1

## 2017-06-23 MED ORDER — POTASSIUM CHLORIDE CRYS ER 20 MEQ PO TBCR
40.0000 meq | EXTENDED_RELEASE_TABLET | Freq: Two times a day (BID) | ORAL | Status: DC
  Administered 2017-06-23 – 2017-06-26 (×4): 40 meq via ORAL
  Filled 2017-06-23 (×5): qty 2

## 2017-06-23 MED ORDER — B COMPLEX-C PO TABS
1.0000 | ORAL_TABLET | Freq: Every day | ORAL | Status: DC
  Administered 2017-06-25 – 2017-06-26 (×2): 1 via ORAL
  Filled 2017-06-23 (×2): qty 1

## 2017-06-23 MED ORDER — PROSIGHT PO TABS
1.0000 | ORAL_TABLET | Freq: Every day | ORAL | Status: DC
  Administered 2017-06-24 – 2017-06-26 (×3): 1 via ORAL
  Filled 2017-06-23 (×3): qty 1

## 2017-06-23 MED ORDER — CALCIUM CARBONATE-VIT D-MIN 600-400 MG-UNIT PO TABS: 1.0000 | ORAL_TABLET | Freq: Every day | ORAL | Status: DC

## 2017-06-23 MED ORDER — AMLODIPINE BESYLATE 5 MG PO TABS: 5.0000 mg | ORAL_TABLET | Freq: Every day | ORAL | Status: DC

## 2017-06-23 MED ORDER — THIAMINE HCL 100 MG/ML IJ SOLN
Freq: Once | INTRAVENOUS | Status: AC
  Administered 2017-06-23: 18:00:00 via INTRAVENOUS
  Filled 2017-06-23: qty 1000

## 2017-06-23 MED ORDER — ENOXAPARIN SODIUM 40 MG/0.4ML ~~LOC~~ SOLN
40.0000 mg | SUBCUTANEOUS | Status: DC
  Administered 2017-06-23 – 2017-06-27 (×5): 40 mg via SUBCUTANEOUS
  Filled 2017-06-23 (×6): qty 0.4

## 2017-06-23 MED ORDER — ACETAMINOPHEN 325 MG PO TABS: 650.0000 mg | ORAL_TABLET | Freq: Four times a day (QID) | ORAL | Status: DC | PRN

## 2017-06-23 NOTE — ED Notes (Signed)
Bair hugger applied and set at [employer].

## 2017-06-23 NOTE — ED Notes (Signed)
Patient transported to CT 

## 2017-06-23 NOTE — Progress Notes (Signed)
Took patient's blood pressure manually, found to be 86/44.  Paged provider C. Bodenheimer, who ordered a 1,04800mL bolus of normal saline.  Will continue to monitor.

## 2017-06-23 NOTE — ED Notes (Signed)
Mittens placed on patients hands.

## 2017-06-23 NOTE — ED Provider Notes (Signed)
[facility] EMERGENCY DEPARTMENT Provider Note   CSN: [id number] Arrival date & time:        History   Chief Complaint Chief Complaint  Patient presents with  . Fall    HPI Elijah Nixon is a 84 y.o. male.  Patient is an 84 year old male with multiple medical problems including Grovers disease with constant rash, alcohol abuse now abstinent from alcohol for 2 months, recurrent falls most recently this past month resulting in a hip fracture with surgery and an nail placed, hypertension, urinary retention, chronic lower extremity edema who is presenting today by EMS.  She has been out of rehab for the last 2 weeks and saw his PCP on 06/18/2017 for a follow-up.  At that time patient was noted to have bilateral lower extremity edema and wife noted hallucinations worse at night.  Patient was also complaining of some difficulty with urination and was noted to be mildly hypotensive in the 90s systolically in the office.  It was thought that he may have a urinary tract infection and was empirically started on Keflex and a UA was sent.  However the urine culture came back negative and per notes as patient's wife is not currently present states that patient is having ongoing hallucinations, persistent swelling in the lower extremities and worsening generalized weakness to where now he is unable to participate in PT OT at home.  Patient also states that he fell 2 days ago but is unclear if this is accurate or not.  Patient is denying any cough, chest pain, shortness of breath, abdominal pain, nausea vomiting or diarrhea.  His last bowel movement was yesterday.  Patient states he was having difficulty urinating but recently he has been able to urinate adequately and feels like he is emptying his bladder.  He does not know if he has had a fever.   The history is provided by the patient and medical records.    Past Medical History:  Diagnosis Date  . AA (alcohol abuse)    at least  8-10 drinks/day  . Arthritis    OA  . DDD (degenerative disc disease)   . ED (erectile dysfunction)   . Grover's disease   . History of colon polyps   . History of diverticulitis of colon   . Hypertension   . Macular degeneration    dry  . Osteoporosis    spinal compression fx    Patient Active Problem List   Diagnosis Date Noted  . Urinary retention 06/20/2017  . Frequent falls 06/19/2017  . Pain and swelling of left knee 06/18/2017  . Change in mental status 06/18/2017  . Tachycardia 06/09/2017  . Fall 06/09/2017  . S/P right hip fracture 04/28/2017  . Hyponatremia 04/28/2017  . Prediabetes 10/17/2016  . Anemia 10/17/2016  . 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 BPH (benign prostatic hyperplasia) 10/07/2016  . Lumbar disc disease 05/06/2015  . Routine general medical examination at a health care facility 10/02/2014  . Colon cancer screening 09/29/2013  . Encounter for Medicare annual wellness exam 09/27/2012  . Seborrheic keratosis 03/29/2012  . SWELLING, MASS, OR LUMP IN CHEST 06/24/2009  . ERECTILE DYSFUNCTION 02/16/2009  . ACTINIC KERATOSIS 02/16/2009  . H/O alcohol abuse 10/27/2008  . Essential hypertension 10/27/2008  . GEN OSTEOARTHROSIS INVOLVING MULTIPLE SITES 10/27/2008  . Osteoporosis 10/27/2008  . COLONIC POLYPS, HX OF 10/27/2008  . DIVERTICULITIS, HX OF 10/27/2008    Past Surgical History:  Procedure Laterality Date  . BACK SURGERY    .  INTRAMEDULLARY (IM) NAIL INTERTROCHANTERIC Right 04/28/2017   Procedure: INTRAMEDULLARY (IM) NAIL INTERTROCHANTRIC;  Surgeon: Bjorn PippinVarkey, Dax T, MD;  Location: [facility] OR;  Service: Orthopedics;  Laterality: Right;  . SPINE SURGERY  1971   deg disc disease x 2       Home Medications    Prior to Admission medications   Medication Sig Start Date End Date Taking? Authorizing Provider  amLODipine (NORVASC) 10 MG tablet TAKE 1 TABLET BY MOUTH DAILY Patient taking differently: Take 5 mg by mouth daily.  10/17/16   Tower, Audrie GallusMarne A, MD    aspirin 81 MG tablet Take 81 mg by mouth daily.      [provider]  b complex vitamins tablet Take 1 tablet by mouth daily.    [provider]  Calcium Carbonate-Vit D-Min 600-400 MG-UNIT TABS Take 1 tablet by mouth daily.     [provider]  cephALEXin (KEFLEX) 500 MG capsule Take 1 capsule (500 mg total) by mouth 3 (three) times daily. 06/18/17   Tower, Audrie GallusMarne A, MD  Cyanocobalamin (VITAMIN B-12 PO) Take 1 tablet by mouth daily.    [provider]  doxazosin (CARDURA) 8 MG tablet TAKE ONE (1) TABLET BY MOUTH EVERY DAY Patient taking differently: Take 8 mg by mouth daily.  10/17/16   Tower, Audrie GallusMarne A, MD  ferrous sulfate 325 (65 FE) MG tablet Take 1 tablet (325 mg total) by mouth 3 (three) times daily with meals. 05/02/17   Richarda OverlieAbrol, Nayana, MD  folic acid (FOLVITE) 1 MG tablet Take 1 tablet (1 mg total) by mouth daily. 05/02/17   Richarda OverlieAbrol, Nayana, MD  ibuprofen (ADVIL,MOTRIN) 200 MG tablet Take 200 mg by mouth every 6 (six) hours as needed for fever or headache.    [provider]  Multiple Vitamins-Minerals (PRESERVISION/LUTEIN) CAPS Take 1 capsule by mouth daily.      [provider]  potassium chloride SA (K-DUR,KLOR-CON) 20 MEQ tablet Take 2 tablets (40 mEq total) by mouth 2 (two) times daily. 05/02/17   Richarda OverlieAbrol, Nayana, MD  spironolactone (ALDACTONE) 25 MG tablet Take 0.5 tablets (12.5 mg total) by mouth daily. 10/17/16   Tower, Audrie GallusMarne A, MD    Family History Family History  Problem Relation Age of Onset  . Diabetes Son   . Dementia Mother     Social History Social History   Tobacco Use  . Smoking status: Never Smoker  . Smokeless tobacco: Never Used  Substance Use Topics  . Alcohol use: Yes    Alcohol/week: 6.0 oz    Types: 10 Cans of beer per week    Comment: 10 cans throughout the day   . Drug use: No     Allergies   Lisinopril   Review of Systems Review of Systems  All other systems reviewed and are negative.    Physical  Exam Updated Vital Signs BP (!) 135/118   Pulse 90   Temp (!) 93.7 F (34.3 C)   Resp 18   Ht 5\' 7"  (1.702 m)   Wt 65.8 kg (145 lb)   SpO2 100%   BMI 22.71 kg/m   Physical Exam  Constitutional: He appears well-developed and well-nourished. No distress.  Mildly cachectic  HENT:  Head: Normocephalic and atraumatic.  Mouth/Throat: Oropharynx is clear and moist. Mucous membranes are dry.  Eyes: Conjunctivae and EOM are normal. Pupils are equal, round, and reactive to light.  Neck: Normal range of motion. Neck supple.  Cardiovascular: Normal rate, regular rhythm and intact distal pulses.  No murmur heard. Pulmonary/Chest: Effort normal and breath sounds normal. No respiratory distress. He has no wheezes. He has no rales.  Abdominal: Soft. He exhibits no distension. There is no tenderness. There is no rebound and no guarding.  Musculoskeletal: Normal range of motion. He exhibits edema and tenderness.       Left hip: He exhibits tenderness. He exhibits normal range of motion, normal strength and no deformity.       Legs: Bilateral lower extremity edema 1+ to the midshin  Neurological: He is alert. He has normal strength. No sensory deficit.  Oriented to person and place.  Some difficulty with history giving and time frames  Skin: Skin is warm and dry. Rash noted. No erythema.  Diffuse maculopapular rash present over the entire body.  Areas of excoriation without evidence of cellulitis  Psychiatric: He has a normal mood and affect. His behavior is normal.  Nursing note and vitals reviewed.    ED Treatments / Results  Labs (all labs ordered are listed, but only abnormal results are displayed) Labs Reviewed  CBC WITH DIFFERENTIAL/PLATELET - Abnormal; Notable for the following components:      Result Value   WBC 12.1 (*)    RBC 3.18 (*)    Hemoglobin 11.0 (*)    HCT 30.4 (*)    MCH 34.6 (*)    MCHC 36.2 (*)    RDW 15.6 (*)    Platelets 418 (*)    Eosinophils Absolute 3.1 (*)     All other components within normal limits  COMPREHENSIVE METABOLIC PANEL - Abnormal; Notable for the following components:   CO2 18 (*)    Calcium 8.7 (*)    Total Protein 5.8 (*)    Albumin 2.6 (*)    AST 46 (*)    Alkaline Phosphatase 157 (*)    All other components within normal limits  URINALYSIS, ROUTINE W REFLEX MICROSCOPIC - Abnormal; Notable for the following components:   APPearance HAZY (*)    Ketones, ur 5 (*)    All other components within normal limits  ACETAMINOPHEN LEVEL - Abnormal; Notable for the following components:   Acetaminophen (Tylenol), Serum <10 (*)    All other components within normal limits  BRAIN NATRIURETIC PEPTIDE  AMMONIA  SALICYLATE LEVEL  TSH  I-STAT TROPONIN, ED  I-STAT CG4 LACTIC ACID, ED    EKG  EKG Interpretation  Date/Time:  Saturday June 23 2017 10:25:05 EST Ventricular Rate:  84 PR Interval:    QRS Duration: 104 QT Interval:  392 QTC Calculation: 464 R Axis:   72 Text Interpretation:  Sinus rhythm No significant change since last tracing Confirmed by Gwyneth Sprout ([id number]) on 06/23/2017 12:32:52 PM       Radiology Dg Chest 2 View  Result Date: 06/23/2017 CLINICAL DATA:  Hallucinations.  Right hip pain. EXAM: CHEST - 2 VIEW COMPARISON:  Chest radiograph 06/09/2017 and CTA 06/10/2017 FINDINGS: Telemetry leads overlie the chest. The cardiac silhouette is upper limits of normal in size. The lungs are mildly hyperinflated. Blunting of the costophrenic angles may reflect tiny residual pleural effusions. No confluent airspace opacity, edema, or pneumothorax is identified. Old right rib fractures are noted. IMPRESSION: Hyperinflation and possible tiny bilateral pleural effusions. No evidence of acute airspace disease. Electronically Signed   By: Sebastian Ache M.D.   On: 06/23/2017 11:42   Ct Head Wo Contrast  Result Date: 06/23/2017 CLINICAL DATA:  Patient fell 2 days ago. Altered mental status. Generalized weakness. EXAM: CT HEAD  WITHOUT CONTRAST TECHNIQUE: Contiguous axial images were obtained from the base of the skull through the vertex without intravenous contrast. COMPARISON:  None. FINDINGS: Brain: Atrophy with sulcal prominence and centralized volume loss, left slightly greater than right. Scattered periventricular hypodensities. Old lacunar infarct within the left insular cortex (image 20, series 4). Bilateral basal ganglial calcifications. No intraparenchymal or extra-axial mass or hemorrhage. Normal configuration of the ventricles and the basilar cisterns. No midline shift. Vascular: Intracranial atherosclerosis. Skull: No displaced calvarial fracture. Sinuses/Orbits: There is minimal polypoid mucosal thickening involving the anterior aspect of the left maxillary sinus. The remaining paranasal sinuses and mastoid air cells are normally aerated. No air-fluid levels. Post bilateral cataract surgery. Other: Regional soft tissues appear normal. IMPRESSION: Atrophy and microvascular ischemic disease without superimposed acute intracranial process. Electronically Signed   By: Simonne Come M.D.   On: 06/23/2017 10:54   Dg Hip Unilat With Pelvis 2-3 Views Left  Result Date: 06/23/2017 CLINICAL DATA:  Right hand pain. Recent intramedullary nail placement in January 2019. EXAM: DG HIP (WITH OR WITHOUT PELVIS) 2-3V LEFT COMPARISON:  Right hip x-rays dated June 09, 2017. FINDINGS: Prior cephalomedullary nail fixation of the comminuted intertrochanteric fracture with displaced lesser trochanteric fragment. Worsened coxa vara deformity with the femoral nail now protruding through the femoral head surface into the hip joint. Unchanged fracture through the distal transfixing screw. No new acute fracture. The pubic symphysis and sacroiliac joints are intact. Left hip joint space is preserved. Diffuse osteopenia. IMPRESSION: 1. 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 2. Unchanged fracture of the distal transfixing screw. Electronically Signed   By: Obie Dredge M.D.   On: 06/23/2017 11:49    Procedures Procedures (including critical care time)  Medications Ordered in ED Medications - No data to display   Initial Impression / Assessment and Plan / ED Course  I have reviewed the triage vital signs and the nursing notes.  Pertinent labs & imaging results that were available during my care of the patient were reviewed by me and considered in my medical decision making (see chart for details).     Patient with multiple medical problems recently here for surgery of his right hip after mechanical fall resulting in intramedullary nail and then at rehab who is been home now for 2 weeks presenting per medical records with ongoing hallucinations, worsening generalized weakness without an acute cause.  Patient initially saw his PCP 4 days ago and at that time was thought to possibly have a urinary tract infection as he was also complaining of some retention.  However since urine cultures have come back and are negative.  Patient has become increasingly weak per PT OT eval and now is unable to participate.  Also wife notes hallucinations have continued.  Patient was noted to recently stop drinking alcohol approximately 2 months ago and it is unclear if this is related to why is having hallucinations however he is also had multiple falls.  He has not had any imaging of his head.  With the last fall due to tachycardia and elevated d-dimer he had a CTA of his chest but there is no evidence of PE.  He denies any respiratory symptoms today does not appear to be septic.  He does have some pain with range of motion of his left hip and states he fell 2 days ago however unclear if this is true.  We will do a CT of the head, left hip films as patient does have some posterior pelvic tenderness, chest x-ray, EKG, troponin, BNP although it seems that  patient has had chronic swelling in the lower extremities and recently had change in his amlodipine, UA, CBC, CMP.  Patient is currently stable.   1:24 PM Patient's labs without any significant new findings.  Normal lactate, troponin, CBC with improving leukocytosis and CMP without acute findings.  Patient's wife who is now present and she states he has not had any alcohol to drink in the last 2 months.  He is not recently discontinued any medications including narcotics.  Patient symptoms seem to be related to delirium unclear the cause.  We will check an ammonia, salicylate and acetaminophen level.  Patient's head CT without acute findings, chest x-ray without acute findings and hip films showing persistent symptoms that are unchanged.  Plan will be to admit the patient as he is unable to return home in the current state he is in.  We will place a Foley catheter as patient's wife states he has not urinated since yesterday.  3:34 PM Without acute findings.  Patient remains cold with a temperature of 93.7 was placed on a [employer]  Also TSH pending.  Will admit for further care.  Final Clinical Impressions(s) / ED Diagnoses   Final diagnoses:  Pain  Hypothermia, initial encounter  Delirium    ED Discharge Orders    None       Gwyneth Sprout, MD 06/23/17 1600

## 2017-06-23 NOTE — ED Triage Notes (Signed)
Patient fell 2 days ago. Since then patient has felt weak.

## 2017-06-23 NOTE — H&P (Signed)
History and Physical    [name] ION:[id number] DOB: Feb 14, 1933 DOA: 06/23/2017  PCP: Elijah Pimple, MD  Patient coming from: home  I have personally briefly reviewed patient's old medical records in [facility]  Chief Complaint: hallucinations  HPI: Elijah Nixon is a 84 y.o. male with medical history significant recent alcohol cessation, hypertension, IM nail on 04/28/2017, who presents the emergency department complaining of hallucinations.  He has some episodes where he is looking into his backyard but sees trucks going back on a road that is not there and workers that are not there.  At night the hallucinations become much more concerning is animals are trying to bite him.  Recently discharge from skilled nursing facility 2 weeks ago.  He had a fall at home again about 4 or 5 days ago and was seen by his primary care physician Dr. Milinda Nixon and was felt to be okay he did not have any injuries.  Hallucinating in February when he was admitted to the hospital at that admission he seemed to have improved.  He has not drank in 2 months.  His blood pressures have been relatively soft and Dr. Milinda Nixon has decreased his amlodipine to 5 mg from 10 mg previously.  His wife says he has been declining over the past week and did not go to physical therapy or occupational therapy yesterday due to the weakness and the hallucinations.  In the emergency department he is agitated he is fidgety and he is having some hallucinations.  He denies cough or nausea vomiting or diarrhea he has no UTI symptoms.  Referred to me for further evaluation and management.  I will place patient in observation for further evaluation.   Review of Systems: As per HPI otherwise all other systems reviewed and  negative.    Past Medical History:  Diagnosis Date  . AA (alcohol abuse)    at least 8-10 drinks/day  . Arthritis    OA  . DDD (degenerative disc disease)   . ED (erectile dysfunction)   . Grover's disease   .  History of colon polyps   . History of diverticulitis of colon   . Hypertension   . Macular degeneration    dry  . Osteoporosis    spinal compression fx    Past Surgical History:  Procedure Laterality Date  . BACK SURGERY    . INTRAMEDULLARY (IM) NAIL INTERTROCHANTERIC Right 04/28/2017   Procedure: INTRAMEDULLARY (IM) NAIL INTERTROCHANTRIC;  Surgeon: Bjorn Pippin, MD;  Location: [facility] OR;  Service: Orthopedics;  Laterality: Right;  . SPINE SURGERY  1971   deg disc disease x 2     reports that  has never smoked. he has never used smokeless tobacco. He reports that he drinks about 6.0 oz of alcohol per week. He reports that he does not use drugs.  Allergies  Allergen Reactions  . Lisinopril Other (See Comments)    REACTION: increased potassium leveles    Family History  Problem Relation Age of Onset  . Diabetes Son   . Dementia Mother     Prior to Admission medications   Medication Sig Start Date End Date Taking? Authorizing Provider  amLODipine (NORVASC) 10 MG tablet TAKE 1 TABLET BY MOUTH DAILY Patient taking differently: Take 5 mg by mouth daily.  10/17/16  Yes Tower, Audrie Gallus, MD  aspirin 81 MG tablet Take 81 mg by mouth daily.     Yes [provider]  b complex vitamins tablet Take  1 tablet by mouth daily.   Yes [provider]  Calcium Carbonate-Vit D-Min 600-400 MG-UNIT TABS Take 1 tablet by mouth daily.    Yes [provider]  Cyanocobalamin (VITAMIN B-12 PO) Take 1 tablet by mouth daily.   Yes [provider]  doxazosin (CARDURA) 8 MG tablet TAKE ONE (1) TABLET BY MOUTH EVERY DAY Patient taking differently: Take 8 mg by mouth daily.  10/17/16  Yes Tower, Audrie Gallus, MD  ferrous sulfate 325 (65 FE) MG tablet Take 1 tablet (325 mg total) by mouth 3 (three) times daily with meals. 05/02/17  Yes Richarda Overlie, MD  folic acid (FOLVITE) 1 MG tablet Take 1 tablet (1 mg total) by mouth daily. 05/02/17  Yes Richarda Overlie, MD  ibuprofen (ADVIL,MOTRIN)  200 MG tablet Take 200 mg by mouth every 6 (six) hours as needed for fever or headache.   Yes [provider]  Multiple Vitamins-Minerals (PRESERVISION/LUTEIN) CAPS Take 1 capsule by mouth daily.     Yes [provider]  potassium chloride SA (K-DUR,KLOR-CON) 20 MEQ tablet Take 2 tablets (40 mEq total) by mouth 2 (two) times daily. 05/02/17  Yes Richarda Overlie, MD  spironolactone (ALDACTONE) 25 MG tablet Take 0.5 tablets (12.5 mg total) by mouth daily. 10/17/16  Yes Tower, Audrie Gallus, MD    Physical Exam: Vitals:   06/23/17 1445 06/23/17 1500 06/23/17 1519 06/23/17 1545  BP:  (!) 135/118 (!) 119/52 (!) 119/97  Pulse: 90 90 88 92  Resp: 18   15  Temp: (!) 93.7 F (34.3 C) (!) 93.7 F (34.3 C) (!) 93.9 F (34.4 C) (!) 94.1 F (34.5 C)  TempSrc:      SpO2: 99% 100% 99% 100%  Weight:      Height:       .TCS Constitutional: NAD, calm, comfortable Vitals:   06/23/17 1445 06/23/17 1500 06/23/17 1519 06/23/17 1545  BP:  (!) 135/118 (!) 119/52 (!) 119/97  Pulse: 90 90 88 92  Resp: 18   15  Temp: (!) 93.7 F (34.3 C) (!) 93.7 F (34.3 C) (!) 93.9 F (34.4 C) (!) 94.1 F (34.5 C)  TempSrc:      SpO2: 99% 100% 99% 100%  Weight:      Height:       Eyes: PERRL, lids and conjunctivae normal ENMT: Mucous membranes are moist. Posterior pharynx clear of any exudate or lesions.Normal dentition.  Neck: normal, supple, no masses, no thyromegaly Respiratory: clear to auscultation bilaterally, no wheezing, no crackles. Normal respiratory effort. No accessory muscle use.  Cardiovascular: Regular rate and rhythm, no murmurs / rubs / gallops. 2+ extremity edema. 2+ pedal pulses. No carotid bruits.  Abdomen: no tenderness, no masses palpated. No hepatosplenomegaly. Bowel sounds positive.  Musculoskeletal: no clubbing / cyanosis. No joint deformity upper and lower extremities. Good ROM, no contractures. Normal muscle tone.  Skin: no rashes, lesions, ulcers. No induration Neurologic: CN  2-12 grossly intact. Sensation intact, DTR normal. Strength 5/5 in all 4.  Psychiatric: Judgment is good but he is hallucinating.  He is alert knows he is at the hospital.    Labs on Admission: I have personally reviewed following labs and imaging studies  CBC: Recent Labs  Lab 06/18/17 1126 06/23/17 1010  WBC 13.7* 12.1*  NEUTROABS 8.4* 7.2  HGB 11.3* 11.0*  HCT 33.6* 30.4*  MCV 94.4 95.6  PLT 454.0* 418*   Basic Metabolic Panel: Recent Labs  Lab 06/18/17 1126 06/23/17 1010  NA 139 139  K 4.4 4.4  CL 108 111  CO2 24 18*  GLUCOSE 93 92  BUN 26* 17  CREATININE 0.75 0.84  CALCIUM 9.1 8.7*   GFR: Estimated Creatinine Clearance: 63.1 mL/min (by C-G formula based on SCr of 0.84 mg/dL). Liver Function Tests: Recent Labs  Lab 06/23/17 1010  AST 46*  ALT 54  ALKPHOS 157*  BILITOT 0.5  PROT 5.8*  ALBUMIN 2.6*   No results for input(s): LIPASE, AMYLASE in the last 168 hours. Recent Labs  Lab 06/23/17 1315  AMMONIA 18   Coagulation Profile: No results for input(s): INR, PROTIME in the last 168 hours. Cardiac Enzymes: No results for input(s): CKTOTAL, CKMB, CKMBINDEX, TROPONINI in the last 168 hours. BNP (last 3 results) No results for input(s): PROBNP in the last 8760 hours. HbA1C: No results for input(s): HGBA1C in the last 72 hours. CBG: No results for input(s): GLUCAP in the last 168 hours. Lipid Profile: No results for input(s): CHOL, HDL, LDLCALC, TRIG, CHOLHDL, LDLDIRECT in the last 72 hours. Thyroid Function Tests: Recent Labs    06/23/17 1518  TSH 5.441*   Anemia Panel: No results for input(s): VITAMINB12, FOLATE, FERRITIN, TIBC, IRON, RETICCTPCT in the last 72 hours. Urine analysis:    Component Value Date/Time   COLORURINE YELLOW 06/23/2017 1010   APPEARANCEUR HAZY (A) 06/23/2017 1010   LABSPEC 1.020 06/23/2017 1010   PHURINE 5.0 06/23/2017 1010   GLUCOSEU NEGATIVE 06/23/2017 1010   HGBUR NEGATIVE 06/23/2017 1010   BILIRUBINUR NEGATIVE  06/23/2017 1010   BILIRUBINUR Negative 06/19/2017 1419   KETONESUR 5 (A) 06/23/2017 1010   PROTEINUR NEGATIVE 06/23/2017 1010   UROBILINOGEN 0.2 06/19/2017 1419   NITRITE NEGATIVE 06/23/2017 1010   LEUKOCYTESUR NEGATIVE 06/23/2017 1010    Radiological Exams on Admission: Dg Chest 2 View  Result Date: 06/23/2017 CLINICAL DATA:  Hallucinations.  Right hip pain. EXAM: CHEST - 2 VIEW COMPARISON:  Chest radiograph 06/09/2017 and CTA 06/10/2017 FINDINGS: Telemetry leads overlie the chest. The cardiac silhouette is upper limits of normal in size. The lungs are mildly hyperinflated. Blunting of the costophrenic angles may reflect tiny residual pleural effusions. No confluent airspace opacity, edema, or pneumothorax is identified. Old right rib fractures are noted. IMPRESSION: Hyperinflation and possible tiny bilateral pleural effusions. No evidence of acute airspace disease. Electronically Signed   By: Sebastian Ache M.D.   On: 06/23/2017 11:42   Ct Head Wo Contrast  Result Date: 06/23/2017 CLINICAL DATA:  Patient fell 2 days ago. Altered mental status. Generalized weakness. EXAM: CT HEAD WITHOUT CONTRAST TECHNIQUE: Contiguous axial images were obtained from the base of the skull through the vertex without intravenous contrast. COMPARISON:  None. FINDINGS: Brain: Atrophy with sulcal prominence and centralized volume loss, left slightly greater than right. Scattered periventricular hypodensities. Old lacunar infarct within the left insular cortex (image 20, series 4). Bilateral basal ganglial calcifications. No intraparenchymal or extra-axial mass or hemorrhage. Normal configuration of the ventricles and the basilar cisterns. No midline shift. Vascular: Intracranial atherosclerosis. Skull: No displaced calvarial fracture. Sinuses/Orbits: There is minimal polypoid mucosal thickening involving the anterior aspect of the left maxillary sinus. The remaining paranasal sinuses and mastoid air cells are normally  aerated. No air-fluid levels. Post bilateral cataract surgery. Other: Regional soft tissues appear normal. IMPRESSION: Atrophy and microvascular ischemic disease without superimposed acute intracranial process. Electronically Signed   By: Simonne Come M.D.   On: 06/23/2017 10:54   Dg Hip Unilat With Pelvis 2-3 Views Left  Result Date: 06/23/2017 CLINICAL DATA:  Right hand pain. Recent intramedullary nail placement in January 2019. EXAM: DG HIP (WITH OR WITHOUT PELVIS) 2-3V LEFT COMPARISON:  Right hip x-rays dated June 09, 2017. FINDINGS: Prior cephalomedullary nail fixation of the comminuted intertrochanteric fracture with displaced lesser trochanteric fragment. Worsened coxa vara deformity with the femoral nail now protruding through the femoral head surface into the hip joint. Unchanged fracture through the distal transfixing screw. No new acute fracture. The pubic symphysis and sacroiliac joints are intact. Left hip joint space is preserved. Diffuse osteopenia. IMPRESSION: 1. 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 2. Unchanged fracture of the distal transfixing screw. Electronically Signed   By: Obie DredgeWilliam T Derry M.D.   On: 06/23/2017 11:49    EKG: Independently reviewed.  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  This can take up to 6 months to resolve after someone initially stops drinking.  He did seem to have had a fairly rough time over the past couple of months.  The  falls of course are concerning.  And may be associated with the psychosis.  2.  History of alcohol abuse patient currently in remission.  Continue to support patient's efforts to stay dry.  Patient is currently receiving a IV with multivitamins thiamine folate etc. we will continue that once daily while in the hospital.  3.  Frequent falls: This may be related to permanent brain disorder is associated with severe alcohol abuse.  Would consider MRI.  4.  Essential hypertension: Blood pressure has recently been fairly low and but medication has been decreased.  5.  Grovers disease: Patient currently with rash will continue home management.  DVT prophylaxis: Lovenox Code Status: Full Family Communication: Book with patient's wife at length was at the bedside. Disposition Plan: [facility] home with PT and OT Consults called: Spoke with neurology who said they really had nothing to offer at this point.  Suggested use of Seroquel at night.  No formal consult. Admission status: Observation   Lahoma Crockerheresa C Ivaan Liddy MD FACP [facility] Pager [phone]  If 7PM-7AM, please contact night-coverage [email or web address] Password [email or web address]  06/23/2017, 4:28 PM

## 2017-06-24 ENCOUNTER — Observation Stay (HOSPITAL_COMMUNITY): Payer: Medicare Other

## 2017-06-24 DIAGNOSIS — L111 Transient acantholytic dermatosis [Grover]: Secondary | ICD-10-CM

## 2017-06-24 DIAGNOSIS — F1021 Alcohol dependence, in remission: Secondary | ICD-10-CM

## 2017-06-24 DIAGNOSIS — R296 Repeated falls: Secondary | ICD-10-CM

## 2017-06-24 DIAGNOSIS — I10 Essential (primary) hypertension: Secondary | ICD-10-CM

## 2017-06-24 DIAGNOSIS — T68XXXA Hypothermia, initial encounter: Secondary | ICD-10-CM

## 2017-06-24 DIAGNOSIS — F10951 Alcohol use, unspecified with alcohol-induced psychotic disorder with hallucinations: Secondary | ICD-10-CM

## 2017-06-24 LAB — CBC
HCT: 28.6 % — ABNORMAL LOW (ref 39.0–52.0)
HEMOGLOBIN: 10.4 g/dL — AB (ref 13.0–17.0)
MCH: 35.5 pg — ABNORMAL HIGH (ref 26.0–34.0)
MCHC: 36.4 g/dL — AB (ref 30.0–36.0)
MCV: 97.6 fL (ref 78.0–100.0)
Platelets: 411 10*3/uL — ABNORMAL HIGH (ref 150–400)
RBC: 2.93 MIL/uL — AB (ref 4.22–5.81)
RDW: 16.6 % — ABNORMAL HIGH (ref 11.5–15.5)
WBC: 12.5 10*3/uL — ABNORMAL HIGH (ref 4.0–10.5)

## 2017-06-24 LAB — BASIC METABOLIC PANEL
ANION GAP: 10 (ref 5–15)
BUN: 12 mg/dL (ref 6–20)
CALCIUM: 8.2 mg/dL — AB (ref 8.9–10.3)
CO2: 17 mmol/L — ABNORMAL LOW (ref 22–32)
Chloride: 116 mmol/L — ABNORMAL HIGH (ref 101–111)
Creatinine, Ser: 0.88 mg/dL (ref 0.61–1.24)
GLUCOSE: 69 mg/dL (ref 65–99)
Potassium: 4.3 mmol/L (ref 3.5–5.1)
SODIUM: 143 mmol/L (ref 135–145)

## 2017-06-24 LAB — T4, FREE: Free T4: 0.8 ng/dL (ref 0.61–1.12)

## 2017-06-24 LAB — VITAMIN B12: Vitamin B-12: 1132 pg/mL — ABNORMAL HIGH (ref 180–914)

## 2017-06-24 LAB — PHOSPHORUS: Phosphorus: 3.9 mg/dL (ref 2.5–4.6)

## 2017-06-24 LAB — ALKALINE PHOSPHATASE: Alkaline Phosphatase: 145 U/L — ABNORMAL HIGH (ref 38–126)

## 2017-06-24 MED ORDER — TRAZODONE HCL 100 MG PO TABS
100.0000 mg | ORAL_TABLET | Freq: Every evening | ORAL | Status: DC
  Administered 2017-06-24: 100 mg via ORAL
  Filled 2017-06-24: qty 1

## 2017-06-24 MED ORDER — ASPIRIN EC 81 MG PO TBEC
81.0000 mg | DELAYED_RELEASE_TABLET | Freq: Every day | ORAL | Status: DC
  Administered 2017-06-24 – 2017-06-26 (×3): 81 mg via ORAL
  Filled 2017-06-24 (×3): qty 1

## 2017-06-24 MED ORDER — SODIUM CHLORIDE 0.9 % IV SOLN
INTRAVENOUS | Status: DC
Start: 2017-06-24 — End: 2017-06-25
  Administered 2017-06-24 – 2017-06-25 (×2): via INTRAVENOUS

## 2017-06-24 MED ORDER — RISPERIDONE 0.5 MG PO TBDP
0.5000 mg | ORAL_TABLET | Freq: Two times a day (BID) | ORAL | Status: DC
Start: 2017-06-24 — End: 2017-06-27
  Administered 2017-06-24 – 2017-06-26 (×5): 0.5 mg via ORAL
  Filled 2017-06-24 (×7): qty 1

## 2017-06-24 MED ORDER — CYCLOBENZAPRINE HCL 5 MG PO TABS
5.0000 mg | ORAL_TABLET | Freq: Once | ORAL | Status: AC
  Administered 2017-06-24: 5 mg via ORAL
  Filled 2017-06-24: qty 1

## 2017-06-24 MED ORDER — THIAMINE HCL 100 MG/ML IJ SOLN
100.0000 mg | Freq: Every day | INTRAMUSCULAR | Status: DC
  Administered 2017-06-24 – 2017-06-25 (×2): 100 mg via INTRAVENOUS
  Filled 2017-06-24 (×2): qty 2

## 2017-06-24 NOTE — Progress Notes (Signed)
Patient has had uncontrollable tremors and spasms since receiving dose of ativan at 2349.  Paged provider C. Bodenheimer, who ordered flexeril 5mg  PO one time dose.  Will continue to monitor.

## 2017-06-24 NOTE — Consult Note (Signed)
[facility] Consult   Reason for Consult:  ''psychosis, likely alcohol induced?'' Referring Physician:  Dr Broadus John Patient Identification: Elijah Nixon MRN:  [id number] Principal Diagnosis: Alcohol hallucinosis [facility]) Diagnosis:   Patient Active Problem List   Diagnosis Date Noted  . 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 Alcohol use disorder, severe, in early remission ([place]) [F10.21] 06/23/2017  . Urinary retention [R33.9] 06/20/2017  . Falls frequently [R29.6] 06/19/2017  . Pain and swelling of left knee [M25.562, M25.462] 06/18/2017  . Change in mental status [R41.82] 06/18/2017  . Tachycardia [R00.0] 06/09/2017  . Fall [W19.XXXA] 06/09/2017  . S/P right hip fracture [Z87.81] 04/28/2017  . Hyponatremia [E87.1] 04/28/2017  . Prediabetes [R73.03] 10/17/2016  . Anemia [D64.9] 10/17/2016  . 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 BPH (benign prostatic hyperplasia) [N40.0] 10/07/2016  . Lumbar disc disease [M51.9] 05/06/2015  . Routine general medical examination at a health care facility [Z00.00] 10/02/2014  . Colon cancer screening [Z12.11] 09/29/2013  . Encounter for Medicare annual wellness exam [F62.13] 09/27/2012  . Seborrheic keratosis [L82.1] 03/29/2012  . SWELLING, MASS, OR LUMP IN CHEST [R22.2] 06/24/2009  . ERECTILE DYSFUNCTION [F52.8] 02/16/2009  . ACTINIC KERATOSIS [L57.0] 02/16/2009  . Essential hypertension [I10] 10/27/2008  . GEN OSTEOARTHROSIS INVOLVING MULTIPLE SITES [M15.9] 10/27/2008  . Osteoporosis [M81.0] 10/27/2008  . COLONIC POLYPS, HX OF [Z86.010] 10/27/2008  . DIVERTICULITIS, HX OF [Z87.19] 10/27/2008    Total Time spent with patient: 1 hour  Subjective:   Elijah Nixon is a 84 y.o. male patient admitted with hallucinations.  HPI: Patient is an 84 year old male with long-standing history of severe Alcohol use disorder and multiple medical problems. Patient is currently alert but confused and disoriented, therefore he  is unable to give history of the circumstances that brought him to the hospital. However, history was obtained from his wife of over 15 years with whom he lives. Patient's wife reports that her husband has a history of consuming about 6pack of beer/day for between 40-50 years until January this year when he stop following a fall which was significant enough to cause hip fracture requiring a surgical intervention-hip replacement. Hospitalization was complicated by alcohol withdrawal which was treated before patient  went for  Rehabilitation. Wife reports that her husband has been experiencing hallucinations since he was discharged from rehab 3 weeks ago. She states that he has husband talked to her about hearing voices, feeling that bugs are crawling on his skin and seeing things. Wife reports that the hallucination got worse in the last 2 weeks, patient has talked about seeing trucks going backward on the road, seeing animals like dogs, cats in their home even though they have no pets and has kept pinching his skin in order to get rid of invisible bugs on his skin. Furthermore, wife also reports problems with short-term memory and cognitive/functional decline ongoing for more than a year. Wife is certain that patient has not had access to alcohol in over 2 months.Patient'e wife denies any family history of mental illness but reports that alcoholism is very prevalent amongst her husband families.   Past Psychiatric History: as above  Risk to Self: Is patient at risk for suicide?: No Risk to Others:   Prior Inpatient Therapy:   Prior Outpatient Therapy:    Past Medical History:  Past Medical History:  Diagnosis Date  . AA (alcohol abuse)    at least 8-10 drinks/day  . Arthritis    OA  . DDD (degenerative disc disease)   .  ED (erectile dysfunction)   . Grover's disease   . History of colon polyps   . History of diverticulitis of colon   . Hypertension   . Macular degeneration    dry  .  Osteoporosis    spinal compression fx    Past Surgical History:  Procedure Laterality Date  . BACK SURGERY    . INTRAMEDULLARY (IM) NAIL INTERTROCHANTERIC Right 04/28/2017   Procedure: INTRAMEDULLARY (IM) NAIL INTERTROCHANTRIC;  Surgeon: Hiram Gash, MD;  Location: [facility];  Service: Orthopedics;  Laterality: Right;  . SPINE SURGERY  1971   deg disc disease x 2   Family History:  Family History  Problem Relation Age of Onset  . Diabetes Son   . Dementia Mother    Family Psychiatric  History: Alcoholism Social History:  Social History   Substance and Sexual Activity  Alcohol Use Yes  . Alcohol/week: 6.0 oz  . Types: 10 Cans of beer per week   Comment: 10 cans throughout the day      Social History   Substance and Sexual Activity  Drug Use No    Social History   Socioeconomic History  . Marital status: Married    Spouse name: Not on file  . Number of children: Not on file  . Years of education: Not on file  . Highest education level: Not on file  Social Needs  . Financial resource strain: Not on file  . Food insecurity - worry: Not on file  . Food insecurity - inability: Not on file  . Transportation needs - medical: Not on file  . Transportation needs - non-medical: Not on file  Occupational History  . Not on file  Tobacco Use  . Smoking status: Never Smoker  . Smokeless tobacco: Never Used  Substance and Sexual Activity  . Alcohol use: Yes    Alcohol/week: 6.0 oz    Types: 10 Cans of beer per week    Comment: 10 cans throughout the day   . Drug use: No  . Sexual activity: No  Other Topics Concern  . Not on file  Social History Narrative  . Not on file   Additional Social History:    Allergies:   Allergies  Allergen Reactions  . Lisinopril Other (See Comments)    REACTION: increased potassium leveles    Labs:  Results for orders placed or performed during the hospital encounter of 06/23/17 (from the past 48 hour(s))  CBC with  Differential/Platelet     Status: Abnormal   Collection Time: 06/23/17 10:10 AM  Result Value Ref Range   WBC 12.1 (H) 4.0 - 10.5 K/uL   RBC 3.18 (L) 4.22 - 5.81 MIL/uL   Hemoglobin 11.0 (L) 13.0 - 17.0 g/dL   HCT 30.4 (L) 39.0 - 52.0 %   MCV 95.6 78.0 - 100.0 fL   MCH 34.6 (H) 26.0 - 34.0 pg   MCHC 36.2 (H) 30.0 - 36.0 g/dL   RDW 15.6 (H) 11.5 - 15.5 %   Platelets 418 (H) 150 - 400 K/uL   Neutrophils Relative % 59 %   Lymphocytes Relative 11 %   Monocytes Relative 4 %   Eosinophils Relative 26 %   Basophils Relative 0 %   Neutro Abs 7.2 1.7 - 7.7 K/uL   Lymphs Abs 1.3 0.7 - 4.0 K/uL   Monocytes Absolute 0.5 0.1 - 1.0 K/uL   Eosinophils Absolute 3.1 (H) 0.0 - 0.7 K/uL   Basophils Absolute 0.0 0.0 - 0.1 K/uL  Smear Review MORPHOLOGY UNREMARKABLE     Comment: Performed at [facility], [place] [address], [place], [place] [address]  Comprehensive metabolic panel     Status: Abnormal   Collection Time: 06/23/17 10:10 AM  Result Value Ref Range   Sodium 139 135 - 145 mmol/L   Potassium 4.4 3.5 - 5.1 mmol/L   Chloride 111 101 - 111 mmol/L   CO2 18 (L) 22 - 32 mmol/L   Glucose, Bld 92 65 - 99 mg/dL   BUN 17 6 - 20 mg/dL   Creatinine, Ser 0.84 0.61 - 1.24 mg/dL   Calcium 8.7 (L) 8.9 - 10.3 mg/dL   Total Protein 5.8 (L) 6.5 - 8.1 g/dL   Albumin 2.6 (L) 3.5 - 5.0 g/dL   AST 46 (H) 15 - 41 U/L   ALT 54 17 - 63 U/L   Alkaline Phosphatase 157 (H) 38 - 126 U/L   Total Bilirubin 0.5 0.3 - 1.2 mg/dL   GFR calc non Af Amer >60 >60 mL/min   GFR calc Af Amer >60 >60 mL/min    Comment: (NOTE) The eGFR has been calculated using the CKD EPI equation. This calculation has not been validated in all clinical situations. eGFR's persistently <60 mL/min signify possible Chronic Kidney Disease.    Anion gap 10 5 - 15    Comment: Performed at [facility] [address], [place], [place] [address]  Brain natriuretic peptide     Status: None   Collection Time: 06/23/17 10:10 AM   Result Value Ref Range   B Natriuretic Peptide 58.5 0.0 - 100.0 pg/mL    Comment: Performed at [facility] [address], [place], [place] [address]  Urinalysis, Routine w reflex microscopic     Status: Abnormal   Collection Time: 06/23/17 10:10 AM  Result Value Ref Range   Color, Urine YELLOW YELLOW   APPearance HAZY (A) CLEAR   Specific Gravity, Urine 1.020 1.005 - 1.030   pH 5.0 5.0 - 8.0   Glucose, UA NEGATIVE NEGATIVE mg/dL   Hgb urine dipstick NEGATIVE NEGATIVE   Bilirubin Urine NEGATIVE NEGATIVE   Ketones, ur 5 (A) NEGATIVE mg/dL   Protein, ur NEGATIVE NEGATIVE mg/dL   Nitrite NEGATIVE NEGATIVE   Leukocytes, UA NEGATIVE NEGATIVE    Comment: Performed at [facility] [address] [place], [place] [address]  I-stat troponin, ED     Status: None   Collection Time: 06/23/17 10:26 AM  Result Value Ref Range   Troponin i, poc 0.00 0.00 - 0.08 ng/mL   Comment 3            Comment: Due to the release kinetics of cTnI, a negative result within the first hours of the onset of symptoms does not rule out myocardial infarction with certainty. If myocardial infarction is still suspected, repeat the test at appropriate intervals.   I-Stat CG4 Lactic Acid, ED     Status: None   Collection Time: 06/23/17 10:28 AM  Result Value Ref Range   Lactic Acid, Venous 0.81 0.5 - 1.9 mmol/L  Ammonia     Status: None   Collection Time: 06/23/17  1:15 PM  Result Value Ref Range   Ammonia 18 9 - 35 umol/L    Comment: Performed at [facility], [place] [address], [place], [place] [address]  Acetaminophen level     Status: Abnormal   Collection Time: 06/23/17  1:15 PM  Result Value Ref  Range   Acetaminophen (Tylenol), Serum <10 (L) 10 - 30 ug/mL    Comment:        THERAPEUTIC CONCENTRATIONS VARY SIGNIFICANTLY. A RANGE OF 10-30 ug/mL MAY BE AN EFFECTIVE CONCENTRATION FOR MANY PATIENTS. HOWEVER, SOME ARE BEST TREATED AT CONCENTRATIONS OUTSIDE THIS RANGE. ACETAMINOPHEN  CONCENTRATIONS >150 ug/mL AT 4 HOURS AFTER INGESTION AND >50 ug/mL AT 12 HOURS AFTER INGESTION ARE OFTEN ASSOCIATED WITH TOXIC REACTIONS. Performed at [facility], [place] [address], [place], [place] [address]   Salicylate level     Status: None   Collection Time: 06/23/17  1:15 PM  Result Value Ref Range   Salicylate Lvl <7.8 2.8 - 30.0 mg/dL    Comment: Performed at [facility] [address], [place], [place] [address]  TSH     Status: Abnormal   Collection Time: 06/23/17  3:18 PM  Result Value Ref Range   TSH 5.441 (H) 0.350 - 4.500 uIU/mL    Comment: Performed by a 3rd Generation assay with a functional sensitivity of <=0.01 uIU/mL. Performed at [facility], [place] [address], [place], [place] [address]   MRSA PCR Screening     Status: None   Collection Time: 06/23/17  8:32 PM  Result Value Ref Range   MRSA by PCR NEGATIVE NEGATIVE    Comment:        The GeneXpert MRSA Assay (FDA approved for NASAL specimens only), is one component of a comprehensive MRSA colonization surveillance program. It is not intended to diagnose MRSA infection nor to guide or monitor treatment for MRSA infections. Performed at [facility], [place] [address] [place], [place] [address]   Basic metabolic panel     Status: Abnormal   Collection Time: 06/24/17  6:23 AM  Result Value Ref Range   Sodium 143 135 - 145 mmol/L   Potassium 4.3 3.5 - 5.1 mmol/L   Chloride 116 (H) 101 - 111 mmol/L   CO2 17 (L) 22 - 32 mmol/L   Glucose, Bld 69 65 - 99 mg/dL   BUN 12 6 - 20 mg/dL   Creatinine, Ser 0.88 0.61 - 1.24 mg/dL   Calcium 8.2 (L) 8.9 - 10.3 mg/dL   GFR calc non Af Amer >60 >60 mL/min   GFR calc Af Amer >60 >60 mL/min    Comment: (NOTE) The eGFR has been calculated using the CKD EPI equation. This calculation has not been validated in all clinical situations. eGFR's persistently <60 mL/min signify possible Chronic Kidney Disease.    Anion gap 10 5 - 15    Comment:  Performed at [facility] [address] [place], [place] [address]  CBC     Status: Abnormal   Collection Time: 06/24/17  6:23 AM  Result Value Ref Range   WBC 12.5 (H) 4.0 - 10.5 K/uL   RBC 2.93 (L) 4.22 - 5.81 MIL/uL   Hemoglobin 10.4 (L) 13.0 - 17.0 g/dL   HCT 28.6 (L) 39.0 - 52.0 %   MCV 97.6 78.0 - 100.0 fL   MCH 35.5 (H) 26.0 - 34.0 pg   MCHC 36.4 (H) 30.0 - 36.0 g/dL   RDW 16.6 (H) 11.5 - 15.5 %   Platelets 411 (H) 150 - 400 K/uL    Comment: Performed at [facility], [place] [address] [place], [place] [address]  T4, free     Status: None   Collection Time: 06/24/17  9:19 AM  Result Value Ref  Range   Free T4 0.80 0.61 - 1.12 ng/dL    Comment: (NOTE) Biotin ingestion may interfere with free T4 tests. If the results are inconsistent with the TSH level, previous test results, or the clinical presentation, then consider biotin interference. If needed, order repeat testing after stopping biotin. Performed at [facility], [place] [address] [place], [place] [address]   Vitamin B12     Status: Abnormal   Collection Time: 06/24/17  9:19 AM  Result Value Ref Range   Vitamin B-12 1,132 (H) 180 - 914 pg/mL    Comment: (NOTE) This assay is not validated for testing neonatal or myeloproliferative syndrome specimens for Vitamin B12 levels. Performed at [facility], [place] [address], [place], [place] [address]     Current Facility-Administered Medications  Medication Dose Route Frequency Provider Last Rate Last Dose  . 0.9 %  sodium chloride infusion   Intravenous Continuous Domenic Polite, MD 75 mL/hr at 06/24/17 1247    . acetaminophen (TYLENOL) tablet 650 mg  650 mg Oral Q6H PRN Lady Deutscher, MD       Or  . acetaminophen (TYLENOL) suppository 650 mg  650 mg Rectal Q6H PRN Lady Deutscher, MD      . aspirin EC tablet 81 mg  81 mg Oral Daily Domenic Polite, MD   81 mg at 06/24/17 1243  . B-complex with vitamin C tablet 1 tablet  1 tablet Oral  Daily Lady Deutscher, MD   Stopped at 06/24/17 1000  . calcium-vitamin D (OSCAL WITH D) 500-200 MG-UNIT per tablet 1 tablet  1 tablet Oral Q breakfast Lady Deutscher, MD   Stopped at 06/24/17 0800  . doxazosin (CARDURA) tablet 8 mg  8 mg Oral Daily Lady Deutscher, MD   8 mg at 06/24/17 1241  . enoxaparin (LOVENOX) injection 40 mg  40 mg Subcutaneous Q24H Lady Deutscher, MD   40 mg at 06/23/17 1807  . folic acid (FOLVITE) tablet 1 mg  1 mg Oral Daily Lady Deutscher, MD   Stopped at 06/24/17 1000  . multivitamin (PROSIGHT) tablet 1 tablet  1 tablet Oral Daily Lady Deutscher, MD   1 tablet at 06/24/17 1243  . ondansetron (ZOFRAN) tablet 4 mg  4 mg Oral Q6H PRN Lady Deutscher, MD       Or  . ondansetron [facility]) injection 4 mg  4 mg Intravenous Q6H PRN Lady Deutscher, MD      . polyethylene glycol (MIRALAX / GLYCOLAX) packet 17 g  17 g Oral Daily PRN Lady Deutscher, MD   17 g at 06/24/17 1240  . potassium chloride SA (K-DUR,KLOR-CON) CR tablet 40 mEq  40 mEq Oral BID Lady Deutscher, MD   Stopped at 06/24/17 1000  . risperiDONE (RISPERDAL M-TABS) disintegrating tablet 0.5 mg  0.5 mg Oral BID Adriaan Maltese, MD      . sodium chloride flush (NS) 0.9 % injection 3 mL  3 mL Intravenous Q12H Lady Deutscher, MD   3 mL at 06/24/17 1247  . thiamine (B-1) injection 100 mg  100 mg Intravenous Daily Domenic Polite, MD   100 mg at 06/24/17 1246  . traZODone (DESYREL) tablet 100 mg  100 mg Oral QPM Domenic Polite, MD        Musculoskeletal: Strength & Muscle Tone: unable to assess Gait & Station: unable to stand Patient leans: N/A  Psychiatric Specialty Exam: Physical Exam  Psychiatric: His mood appears  anxious. His speech is slurred. He is agitated, aggressive, actively hallucinating and combative. Cognition and memory are impaired. He expresses impulsivity.    Review of Systems  Psychiatric/Behavioral: Positive for hallucinations and memory loss. The patient  is nervous/anxious.     Blood pressure 114/63, pulse 91, temperature (!) 96.6 F (35.9 C), temperature source Rectal, resp. rate (!) 24, height _0  (1.702 m), weight 66.7 kg (147 lb 0.8 oz), SpO2 99 %.Body mass index is 23.03 kg/m.  General Appearance: Casual  Eye Contact:  Minimal  Speech:  Garbled  Volume:  Decreased  Mood:  Irritable  Affect:  Blunt and Flat  Thought Process:  Disorganized  Orientation:  Other:  only to person  Thought Content:  Delusions and Hallucinations: Auditory Tactile Visual  Suicidal Thoughts:  unable to assess  Homicidal Thoughts:  unable to assess  Memory:  unable to assess  Judgement:  Other:  unable to assess  Insight:  unable to assess  Psychomotor Activity:  Increased and Restlessness  Concentration:  Concentration: Poor and Attention Span: Poor  Recall:  Poor  Fund of Knowledge:  unable to assess  Language:  Fair  Akathisia:  No  Handed:  Right  AIMS (if indicated):     Assets:  Social Support  ADL's:  Impaired  Cognition:  Impaired,  Moderate  Sleep:   poor     Treatment Plan Summary: 84 year old man with no prior history of mental illness but has significant long history of alcohol use disorder over 40 years.  He was admitted due to hallucinations, confusion and disorientation. Current, patient is alert but disoriented and unable to give detail history. 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 -Another possibility is Wernicke-Korsakoff syndrome which associated with confusion, agitation, memory problem, confabulation and hallucinosis. May be due to Thiamine def in chronic alcoholics -Needs to explore other medical causes of delirium in a patient with multiple medical problem and recent hip replacement. -Neuro consult for memory problem -Thiamine level, replacement requires if level is  low -Start Risperdal 0.5 mg twice for psychosis, consider increasing to 70m twice daily if psychosis do not improve in 48-72 hrs. -Continue Trazodone for sleep.   Disposition: Re-consult psych service if needed.  ACorena Pilgrim MD 06/24/2017 3:02 PM

## 2017-06-24 NOTE — Consult Note (Signed)
NEUROHOSPITALISTS - CONSULTATION NOTE   Requesting Physician: Dr. Domenic Polite [facility]: Dr. Roland Rack  Admit date: 06/23/2017    Chief Complaint: Hallucinations and generalized weakness  History obtained from:  Patient's family and Chart     HPI   Elijah Nixon is an 84 y.o. male PMH of long standing ETOH abuse who quit drinking on April 28, 2017 when he was hospitalized for a Right hip fracture, Hx of Anemia, HTN who presented to the ED for worsening hallucinations and generalized weakness over the past 2 weeks.   Wife states the patient has been home from rehab for approximately 3 weeks. Over the past two weeks he has progressively become weaker each day and his baseline hallucinations have become more frequent. She says it is to the point that his does not have sustained sleep for longer than 1-2 hours per day. She states he has lost over 10 -15 pounds since January.  When he was discharged home he went to his PCP for a swollen Left Knee cap. He was given antibiotics and the wife states it is much better than it was a few weeks ago. She thinks his weakness and hallucinations started to worsen at approximately the same time.  She states that for the past year the patient has been experiencing poor memory and mood changes. He voluntarily stopped driving. They have not seen a Neurologist for any formal testing or evaluation.  She states he has been hallucinating since his hip surgery and since pain medications had been prescribed. She states since the surgery patient has been experiencing these very brief "episodes" of starring then jerky body movements intermittently throughout the day.  Per EMR note on 06/18/2017 Office Visit Dr Loura Pardon Hallucinations at night (feels bugs) Now occ during the day-sees people/dogs  Wife is not sure if this is from prev etoh abuse or something more is  adding  Per EMR note on 05/22/2017 Telephone call with wife to office of Dr Loura Pardon Also reports increased confusion with hallucinations, worsening at night "for a while now".   Past Medical History    Past Medical History:  Diagnosis Date  . AA (alcohol abuse)    at least 8-10 drinks/day  . Arthritis    OA  . DDD (degenerative disc disease)   . ED (erectile dysfunction)   . Grover's disease   . History of colon polyps   . History of diverticulitis of colon   . Hypertension   . Macular degeneration    dry  . Osteoporosis    spinal compression fx   Past Surgical History   Past Surgical History:  Procedure Laterality Date  . BACK SURGERY    . INTRAMEDULLARY (IM) NAIL INTERTROCHANTERIC Right 04/28/2017   Procedure: INTRAMEDULLARY (IM) NAIL INTERTROCHANTRIC;  Surgeon: Hiram Gash, MD;  Location: [facility];  Service: Orthopedics;  Laterality: Right;  . SPINE SURGERY  1971   deg disc disease x 2   Family History   Family History  Problem Relation Age of Onset  . Diabetes Son   . Dementia Mother    Social History   reports that  has never smoked. he has never used smokeless tobacco. He reports that he drinks about 6.0 oz of alcohol per week. He reports that he does not use drugs.  Allergies   Allergies  Allergen Reactions  . Lisinopril Other (See Comments)    REACTION: increased potassium leveles   Medications Prior to Admission   No current  facility-administered medications on file prior to encounter.    Current Outpatient Medications on File Prior to Encounter  Medication Sig Dispense Refill  . amLODipine (NORVASC) 10 MG tablet TAKE 1 TABLET BY MOUTH DAILY (Patient taking differently: Take 5 mg by mouth daily. ) 90 tablet 3  . aspirin 81 MG tablet Take 81 mg by mouth daily.      Marland Kitchen b complex vitamins tablet Take 1 tablet by mouth daily.    . Calcium Carbonate-Vit D-Min 600-400 MG-UNIT TABS Take 1 tablet by mouth daily.     . Cyanocobalamin (VITAMIN B-12 PO) Take  1 tablet by mouth daily.    Marland Kitchen doxazosin (CARDURA) 8 MG tablet TAKE ONE (1) TABLET BY MOUTH EVERY DAY (Patient taking differently: Take 8 mg by mouth daily. ) 90 tablet 3  . ferrous sulfate 325 (65 FE) MG tablet Take 1 tablet (325 mg total) by mouth 3 (three) times daily with meals. 60 tablet 3  . folic acid (FOLVITE) 1 MG tablet Take 1 tablet (1 mg total) by mouth daily. 30 tablet 2  . ibuprofen (ADVIL,MOTRIN) 200 MG tablet Take 200 mg by mouth every 6 (six) hours as needed for fever or headache.    . Multiple Vitamins-Minerals (PRESERVISION/LUTEIN) CAPS Take 1 capsule by mouth daily.      . potassium chloride SA (K-DUR,KLOR-CON) 20 MEQ tablet Take 2 tablets (40 mEq total) by mouth 2 (two) times daily. 30 tablet 1  . spironolactone (ALDACTONE) 25 MG tablet Take 0.5 tablets (12.5 mg total) by mouth daily. 90 tablet 3    ROS  History obtained from unobtainable from patient due to mental status Patient is easily startled, mumbling, restless, at times staring off and grabbing imaginary things in the air or brief jerking movements. Does not appear to be in pain. No H/A,SOB or CP noted. Multiple areas of skin breakdown on the body. Left knee swollen and red.   Physical Examination   Vitals:   06/24/17 0830 06/24/17 1122 06/24/17 1245 06/24/17 1526  BP:   114/63 118/61  Pulse:   91 95  Resp:   (!) 24 (!) 21  Temp: (!) 96 F (35.6 C) (!) 96.6 F (35.9 C) (!) 96.8 F (36 C) (!) 97.4 F (36.3 C)  TempSrc: Rectal Rectal Rectal Oral  SpO2:   99% 99%  Weight:      Height:       HEENT-  Normocephalic,  Cardiovascular - Regular rate and rhythm  Respiratory - Lungs clear bilaterally. Non-labored breathing, No wheezing. Abdomen - soft and non-tender, BS normal Extremities- no edema or cyanosis Skin-multiple areas of skin breakdown in various stages of healing  Neurological Examination  Mental Status: Awake, briefly attentive to examiner but easily distracted. Restless, at times staring off and  grabbing imaginary things in the air or brief jerking movements. Speech is very dysarthric, mumbling and mostly unintelligible. No neglect noted.  Able to follow simple commands without difficulty. Cranial Nerves: II: Visual Fields are full. Pupils are equal, round, and reactive to light.   III,IV, VI: EOMI without ptosis/diploplia. No nystagmus.   V: Facial sensation is symmetric to temperature VII: Facial movement is symmetric.  VIII: hearing is intact to voice X: Uvula elevates symmetrically XI: Shoulder shrug is symmetric. XII: tongue is midline without atrophy or fasciculations.  Motor: Tone is normal. Bulk is normal. 5/5 strength in all extremities except RLE - at baseline 4/5 due to recent Right hip surgery Sensor: Sensation is symmetric to light touch  and temperature in the arms and legs. Cerebellar: normal finger-to-nose bilaterally Gait: not tested  Laboratory Results  CBC: Recent Labs  Lab 06/18/17 1126 06/23/17 1010 06/24/17 0623  WBC 13.7* 12.1* 12.5*  HGB 11.3* 11.0* 10.4*  HCT 33.6* 30.4* 28.6*  MCV 94.4 95.6 97.6  PLT 454.0* 418* 753*   Basic Metabolic Panel: Recent Labs  Lab 06/18/17 1126 06/23/17 1010 06/24/17 0623  NA 139 139 143  K 4.4 4.4 4.3  CL 108 111 116*  CO2 24 18* 17*  GLUCOSE 93 92 69  BUN 26* 17 12  CREATININE 0.75 0.84 0.88  CALCIUM 9.1 8.7* 8.2*   Recent Labs  Lab 06/23/17 1315  AMMONIA 18   Thyroid Function Studies: Recent Labs    06/23/17 1518  TSH 5.441*   Amenia Work -Up Recent Labs    06/24/17 0919  VITAMINB12 1,132*   Imaging Results   Ct Head Wo Contrast Result Date: 06/23/2017  IMPRESSION: Atrophy and microvascular ischemic disease without superimposed acute intracranial process.   Mr Brain Wo Contrast Result Date: 06/24/2017 IMPRESSION: Severely motion degraded DWI and T2 FLAIR sequences. Patient was unable to continue. Repeat imaging recommended when patient is able to follow commands and hold still.     IMPRESSION AND PLAN  Mr. RAIDER VALBUENA is a 84 y.o. male with PMH of long standing ETOH abuse who quit January 2019, Hx of Anemia, HTN who presented to the ED for worsening hallucinations and generalized weakness over the past 2 weeks. Patient presents with hypothermia, hypotension and multiple medical and electrolyte abnormalities. He continues to have AMS on exam today. No new weakness found on exam. CT scan negative for acute findings and MRI unable to obtain due to cooperation. This patient is predisposed to delirium due to likely underlying neurocognitive disorders and his general current medical condition.  Rule out Wernicke-Korsakoff Syndrome Rule out seizures  PLAN  06/24/2017  Check Ionized Calcium, Magnesium, Phosphorus, Alk Phosphatase and Copper level Consider further workup of thyroid with endocrinology Continue IV multivitamins, thiamine and folate replacement Monitor Thiamine level results Trial low dose Risperdal in progress Avoid overly sedating medications Attempt MRI once patient more cooperative EEG to rule out seizure activity    FAMILY UPDATES:  family at bedside  TEAM UPDATES:Joseph, Jacinta Shoe, MD STATUS: DNR  Hospital day # 0  Follow up Appointments  Follow Up:  Tower, Wynelle Fanny, MD -PCP Follow up in 1-2 weeks       Assessment and plan discussed with with attending physician and they are in agreement.    Ulrick Methot Sella, ANP-C [facility] 06/24/2017, 4:24 PM  06/24/2017 ATTENDING ASSESSMENT    Please page stroke NP/ PA / MD from 8am -4 pm   You can look them up on [email or web address]  Password [email or web address]

## 2017-06-24 NOTE — Progress Notes (Addendum)
PROGRESS NOTE    Elijah RasmussenBilly W Nixon  ZOX:[id number] DOB: January 18, 1933 DOA: 06/23/2017 PCP: Elijah Nixon, Elijah A, MD  Brief Narrative: [name] is Nixon 84 year old male with long-standing history of alcohol use -6pack of beer/day for 40-50years, hypertension, hip fracture in January/treated with intramedullary nail, this is when he quit drinking, hospitalization then complicated by alcohol withdrawal, then went to rehabilitation -Went home from rehabilitation 3 weeks ago and has been having hallucinations since then however much worse in the last 10 days. -In addition wife also reports problems with short-term memory and cognitive/functional decline for approximately 1 year. She reports that he had hallucinations while in rehabilitation as well however they were not this severe. -He has not had access to alcohol since 1/12   Assessment & Plan:   1. 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 Willette PaSheehan, that this potentially could be alcohol induced psychotic disorder, unclear if he could have some component of Korsakoff's psychosis -will ask Neuro for input -MRI was attempted yesterday with Ativan however unable to lay still -We'll check TSH/free T4, B-12, RPR, add on B1 level if blood from admission available -started Seroquel last pm, at very low dose -However continued this and add trazodone at bedtime for sleep -Psychiatry consulted -PT/OT/social work consult -He is going to require placement when more stable cognitively/behaviorally -no evidence of infectious process at this time -add IVF for now  2. ETOH abuse -drinking Nixon 6pack of beer/day for 40-50years -stop drinking in January at admission with Hip Fracture  3. HTN -BP soft but stable, stop amlodipine  4.  Grovers disease: Patient currently with rash  -on topical steroids for  this -suspect this is causing his chronic leukocytosis and mild eosinophilia  DVT prophylaxis:lovenox Code Status: DNR Family Communication: Wife at bedside Disposition Plan: Will need SNF  Consultants:   NEuro  Psych   Procedures:   Antimicrobials:    Subjective: -remians confused, agitated, restless, didn't sleep last pm  Objective: Vitals:   06/24/17 0400 06/24/17 0805 06/24/17 0830 06/24/17 1122  BP:  (!) 112/51    Pulse: 96 99    Resp: 18 16    Temp: (!) 96.2 F (35.7 C)  (!) 96 F (35.6 C) (!) 96.6 F (35.9 C)  TempSrc: Rectal  Rectal Rectal  SpO2: 99% 100%    Weight:      Height:        Intake/Output Summary (Last 24 hours) at 06/24/2017 1139 Last data filed at 06/24/2017 [id number] Gross per 24 hour  Intake -  Output 1600 ml  Net -1600 ml   Filed Weights   06/23/17 1003 06/23/17 1713  Weight: 65.8 kg (145 lb) 66.7 kg (147 lb 0.8 oz)    Examination:  General exam: Elderly thinly built white male, laying in bed, confused, intermittently having jerky movements Respiratory system: Decreased at the bases, rest clear Cardiovascular system: S1 & S2 heard, RRR. Mildly tachycardic Gastrointestinal system: Abdomen is nondistended, soft and nontender.Normal bowel sounds heard. Central nervous system: Awake, disoriented, restless, moves all extremities Extremities: Symmetric 5 x 5 power. Skin: Diffuse chronic rash noted involving the trunk and upper thigh Psych:  Agitated and confused    Data Reviewed:   CBC: Recent Labs  Lab 06/18/17 1126 06/23/17 1010 06/24/17 0623  WBC 13.7* 12.1* 12.5*  NEUTROABS 8.4* 7.2  --   HGB 11.3* 11.0* 10.4*  HCT 33.6* 30.4* 28.6*  MCV 94.4 95.6  97.6  PLT 454.0* 418* 411*   Basic Metabolic Panel: Recent Labs  Lab 06/18/17 1126 06/23/17 1010 06/24/17 0623  NA 139 139 143  K 4.4 4.4 4.3  CL 108 111 116*  CO2 24 18* 17*  GLUCOSE 93 92 69  BUN 26* 17 12  CREATININE 0.75 0.84 0.88  CALCIUM 9.1 8.7* 8.2*    GFR: Estimated Creatinine Clearance: 60.5 mL/min (by C-G formula based on SCr of 0.88 mg/dL). Liver Function Tests: Recent Labs  Lab 06/23/17 1010  AST 46*  ALT 54  ALKPHOS 157*  BILITOT 0.5  PROT 5.8*  ALBUMIN 2.6*   No results for input(s): LIPASE, AMYLASE in the last 168 hours. Recent Labs  Lab 06/23/17 1315  AMMONIA 18   Coagulation Profile: No results for input(s): INR, PROTIME in the last 168 hours. Cardiac Enzymes: No results for input(s): CKTOTAL, CKMB, CKMBINDEX, TROPONINI in the last 168 hours. BNP (last 3 results) No results for input(s): PROBNP in the last 8760 hours. HbA1C: No results for input(s): HGBA1C in the last 72 hours. CBG: No results for input(s): GLUCAP in the last 168 hours. Lipid Profile: No results for input(s): CHOL, HDL, LDLCALC, TRIG, CHOLHDL, LDLDIRECT in the last 72 hours. Thyroid Function Tests: Recent Labs    06/23/17 1518 06/24/17 0919  TSH 5.441*  --   FREET4  --  0.80   Anemia Panel: Recent Labs    06/24/17 0919  VITAMINB12 1,132*   Urine analysis:    Component Value Date/Time   COLORURINE YELLOW 06/23/2017 1010   APPEARANCEUR HAZY (Nixon) 06/23/2017 1010   LABSPEC 1.020 06/23/2017 1010   PHURINE 5.0 06/23/2017 1010   GLUCOSEU NEGATIVE 06/23/2017 1010   HGBUR NEGATIVE 06/23/2017 1010   BILIRUBINUR NEGATIVE 06/23/2017 1010   BILIRUBINUR Negative 06/19/2017 1419   KETONESUR 5 (Nixon) 06/23/2017 1010   PROTEINUR NEGATIVE 06/23/2017 1010   UROBILINOGEN 0.2 06/19/2017 1419   NITRITE NEGATIVE 06/23/2017 1010   LEUKOCYTESUR NEGATIVE 06/23/2017 1010   Sepsis Labs: @LABRCNTIP (procalcitonin:4,lacticidven:4)  ) Recent Results (from the past 240 hour(s))  Urine Culture     Status: None   Collection Time: 06/19/17  2:32 PM  Result Value Ref Range Status   MICRO NUMBER: [id number]  Final   SPECIMEN QUALITY: ADEQUATE  Final   Sample Source URINE  Final   STATUS: FINAL  Final   Result: No Growth  Final  MRSA PCR Screening      Status: None   Collection Time: 06/23/17  8:32 PM  Result Value Ref Range Status   MRSA by PCR NEGATIVE NEGATIVE Final    Comment:        The GeneXpert MRSA Assay (FDA approved for NASAL specimens only), is one component of Nixon comprehensive MRSA colonization surveillance program. It is not intended to diagnose MRSA infection nor to guide or monitor treatment for MRSA infections. Performed at [facility], [address] [place], [place] [address]          Radiology Studies: Dg Chest 2 View  Result Date: 06/23/2017 CLINICAL DATA:  Hallucinations.  Right hip pain. EXAM: CHEST - 2 VIEW COMPARISON:  Chest radiograph 06/09/2017 and CTA 06/10/2017 FINDINGS: Telemetry leads overlie the chest. The cardiac silhouette is upper limits of normal in size. The lungs are mildly hyperinflated. Blunting of the costophrenic angles may reflect tiny residual pleural effusions. No confluent airspace opacity, edema, or pneumothorax is identified. Old right rib fractures are noted. IMPRESSION: Hyperinflation and possible tiny bilateral pleural effusions. No  evidence of acute airspace disease. Electronically Signed   By: Sebastian Ache M.D.   On: 06/23/2017 11:42   Ct Head Wo Contrast  Result Date: 06/23/2017 CLINICAL DATA:  Patient fell 2 days ago. Altered mental status. Generalized weakness. EXAM: CT HEAD WITHOUT CONTRAST TECHNIQUE: Contiguous axial images were obtained from the base of the skull through the vertex without intravenous contrast. COMPARISON:  None. FINDINGS: Brain: Atrophy with sulcal prominence and centralized volume loss, left slightly greater than right. Scattered periventricular hypodensities. Old lacunar infarct within the left insular cortex (image 20, series 4). Bilateral basal ganglial calcifications. No intraparenchymal or extra-axial mass or hemorrhage. Normal configuration of the ventricles and the basilar cisterns. No midline shift. Vascular: Intracranial atherosclerosis. Skull:  No displaced calvarial fracture. Sinuses/Orbits: There is minimal polypoid mucosal thickening involving the anterior aspect of the left maxillary sinus. The remaining paranasal sinuses and mastoid air cells are normally aerated. No air-fluid levels. Post bilateral cataract surgery. Other: Regional soft tissues appear normal. IMPRESSION: Atrophy and microvascular ischemic disease without superimposed acute intracranial process. Electronically Signed   By: Simonne Come M.D.   On: 06/23/2017 10:54   Mr Brain Wo Contrast  Result Date: 06/24/2017 CLINICAL DATA:  84 y/o M; altered level of consciousness unexplained. Lower extremity weakness and hallucinations. EXAM: MRI HEAD WITHOUT CONTRAST TECHNIQUE: Extensive patient motion. Axial DWI, coronal DWI, axial T2 FLAIR sequences were acquired. Patient was unable to continue. COMPARISON:  06/23/2017 CT head. FINDINGS: Severely motion degraded DWI and T2 FLAIR weighted sequences. No gross diffusion signal abnormality or focal mass effect. Brain parenchymal volume loss as seen on prior CT. IMPRESSION: Severely motion degraded DWI and T2 FLAIR sequences. Patient was unable to continue. Repeat imaging recommended when patient is able to follow commands and hold still. Electronically Signed   By: Mitzi Hansen M.D.   On: 06/24/2017 01:05   Dg Hip Unilat With Pelvis 2-3 Views Left  Result Date: 06/23/2017 CLINICAL DATA:  Right hand pain. Recent intramedullary nail placement in January 2019. EXAM: DG HIP (WITH OR WITHOUT PELVIS) 2-3V LEFT COMPARISON:  Right hip x-rays dated June 09, 2017. FINDINGS: Prior cephalomedullary nail fixation of the comminuted intertrochanteric fracture with displaced lesser trochanteric fragment. Worsened coxa vara deformity with the femoral nail now protruding through the femoral head surface into the hip joint. Unchanged fracture through the distal transfixing screw. No new acute fracture. The pubic symphysis and sacroiliac joints  are intact. Left hip joint space is preserved. Diffuse osteopenia. IMPRESSION: 1. 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 2. Unchanged fracture of the distal transfixing screw. Electronically Signed   By: Obie Dredge M.D.   On: 06/23/2017 11:49        Scheduled Meds: . amLODipine  5 mg Oral Daily  . aspirin EC  81 mg Oral Daily  . B-complex with vitamin C  1 tablet Oral Daily  . calcium-vitamin D  1 tablet Oral Q breakfast  . doxazosin  8 mg Oral Daily  . enoxaparin (LOVENOX) injection  40 mg Subcutaneous Q24H  . folic acid  1 mg Oral Daily  . multivitamin  1 tablet Oral Daily  . potassium chloride SA  40 mEq Oral BID  . QUEtiapine  25 mg Oral QHS  . sodium chloride flush  3 mL Intravenous Q12H  . traZODone  100 mg Oral QPM   Continuous Infusions: . sodium chloride  LOS: 0 days    Time spent:    Zannie Cove, MD [facility] Page via [email or web address], password [email or web address] After 7PM please contact night-coverage  06/24/2017, 11:39 AM

## 2017-06-24 NOTE — Evaluation (Addendum)
Physical Therapy Evaluation Patient Details Name: Elijah Nixon MRN: [id number] DOB: 1933/01/28 Today's Date: 06/24/2017   History of Present Illness  84 y.o. male with medical history significant recent alcohol cessation, hypertension, IM nail on 04/28/2017, who presents the emergency department complaining of hallucinations. 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   Clinical Impression  Pt with declining independence over the past 2 weeks with increase in hallucinations. Pt wife reports that he had been doing well with HHPT in rehabbing from his R femur fx and was ambulating with RW, however he refused therapy the day prior to admission due to fatigue. Pt currently is limited in his safe mobility by decreased cognition, increased flexion posture with gross body tremorring. Pt modA for bed mobility and sitting EoB for 3 minutes before fatiguing and laying back. Pt recommends SNF level care at d/c to work on safe mobility. PT will follow acutely until d/c.    Follow Up Recommendations SNF    Equipment Recommendations  None recommended by PT(DME at home )    Recommendations for Other Services       Precautions / Restrictions Precautions Precautions: Fall Restrictions Weight Bearing Restrictions: Yes RLE Weight Bearing: Weight bearing as tolerated Other Position/Activity Restrictions: R hip fx 04/2017      Mobility  Bed Mobility Overal bed mobility: Needs Assistance Bed Mobility: Supine to Sit;Sit to Supine     Supine to sit: Mod assist Sit to supine: Mod assist   General bed mobility comments: modA and 2 attempts to come to seated EoB, moved LE off bed and then moved back then moved them off bed, required modA for trunk to upright, modA to get LE back in bed after sitting   Transfers                 General transfer comment: did not attempt as pt maintained a flexed hip posture sitting EoB and could not relax  down      Balance Overall balance assessment: Needs assistance Sitting-balance support: Bilateral upper extremity supported;Feet unsupported Sitting balance-Leahy Scale: Poor Sitting balance - Comments: required modA decreasing to minA to maintain balance for 3 min EoB before fatiguing                                      Pertinent Vitals/Pain Pain Assessment: Faces Faces Pain Scale: Hurts little more Pain Location: catheter placement Pain Descriptors / Indicators: Discomfort;Grimacing;Guarding Pain Intervention(s): Limited activity within patient's tolerance;Monitored during session;Repositioned    Home Living Family/patient expects to be discharged to:: Private residence Living Arrangements: Spouse/significant other Available Help at Discharge: Family;Available 24 hours/day Type of Home: House Home Access: Stairs to enter   [employer] of Steps: 1 Home Layout: One level Home Equipment: Walker - 2 wheels;Shower seat - built in;Hand held shower head;Grab bars - toilet;Grab bars - tub/shower      Prior Function Level of Independence: Independent with assistive device(s)         Comments: ambulating with RW        Extremity/Trunk Assessment   Upper Extremity Assessment Upper Extremity Assessment: Defer to OT evaluation    Lower Extremity Assessment Lower Extremity Assessment: Difficult to assess due to impaired cognition    Cervical / Trunk Assessment Cervical / Trunk Assessment: Kyphotic  Communication   Communication: Expressive difficulties  Cognition Arousal/Alertness: Awake/alert Behavior During Therapy: Impulsive;Restless;Flat affect;Agitated  Overall Cognitive Status: Impaired/Different from baseline Area of Impairment: Orientation;Attention;Memory;Following commands;Awareness;Safety/judgement;Problem solving                 Orientation Level: Disoriented to;Time;Situation Current Attention Level: Selective Memory:  Decreased short-term memory Following Commands: Follows one step commands inconsistently;Follows one step commands with increased time Safety/Judgement: Decreased awareness of deficits Awareness: Intellectual Problem Solving: Slow processing;Decreased initiation;Difficulty sequencing;Requires verbal cues        General Comments General comments (skin integrity, edema, etc.): Bair Hugger in room, difficult to keep on as pt is very restless replaced after session, Pt wife in room, very tearful about pt decline        Assessment/Plan    PT Assessment Patient needs continued PT services  PT Problem List Decreased strength;Decreased range of motion;Decreased activity tolerance;Decreased balance;Decreased mobility;Decreased coordination;Decreased cognition;Decreased knowledge of use of DME;Decreased safety awareness;Decreased knowledge of precautions       PT Treatment Interventions DME instruction;Gait training;Functional mobility training;Stair training;Therapeutic activities;Therapeutic exercise;Balance training;Cognitive remediation;Patient/family education    PT Goals (Current goals can be found in the Care Plan section)  Acute Rehab PT Goals Patient Stated Goal: none stated  PT Goal Formulation: With patient Time For Goal Achievement: 07/08/17 Potential to Achieve Goals: Fair    Frequency Min 2X/week    AM-PAC PT "6 Clicks" Daily Activity  Outcome Measure Difficulty turning over in bed (including adjusting bedclothes, sheets and blankets)?: Unable Difficulty moving from lying on back to sitting on the side of the bed? : Unable Difficulty sitting down on and standing up from a chair with arms (e.g., wheelchair, bedside commode, etc,.)?: Unable Help needed moving to and from a bed to chair (including a wheelchair)?: Total Help needed walking in hospital room?: Total Help needed climbing 3-5 steps with a railing? : Total 6 Click Score: 6    End of Session   Activity  Tolerance: Patient limited by fatigue Patient left: in bed;with call bell/phone within reach;with family/visitor present;with bed alarm set;Other (comment)(mitts in place ) Nurse Communication: Mobility status PT Visit Diagnosis: Other abnormalities of gait and mobility (R26.89);Muscle weakness (generalized) (M62.81);Difficulty in walking, not elsewhere classified (R26.2);Other symptoms and signs involving the nervous system (R29.898);History of falling (Z91.81)    Time: 1035-1050 PT Time Calculation (min) (ACUTE ONLY): 20 min   Charges:   PT Evaluation $PT Eval Moderate Complexity: 1 Mod     PT G Codes:        Marlisha Vanwyk B. Beverely RisenVan Fleet PT, DPT Acute Rehabilitation  [phone] Pager [phone]    Elon Alaslizabeth B Van Fleet 06/24/2017, 11:19 AM

## 2017-06-25 ENCOUNTER — Observation Stay (HOSPITAL_COMMUNITY): Payer: Medicare Other

## 2017-06-25 ENCOUNTER — Encounter (HOSPITAL_COMMUNITY): Payer: Self-pay

## 2017-06-25 DIAGNOSIS — R410 Disorientation, unspecified: Secondary | ICD-10-CM

## 2017-06-25 DIAGNOSIS — G9340 Encephalopathy, unspecified: Secondary | ICD-10-CM

## 2017-06-25 LAB — URINALYSIS, ROUTINE W REFLEX MICROSCOPIC
Bilirubin Urine: NEGATIVE
Glucose, UA: NEGATIVE mg/dL
Ketones, ur: 5 mg/dL — AB
Leukocytes, UA: NEGATIVE
Nitrite: NEGATIVE
Protein, ur: 100 mg/dL — AB
Specific Gravity, Urine: 1.015 (ref 1.005–1.030)
Squamous Epithelial / LPF: NONE SEEN
pH: 5 (ref 5.0–8.0)

## 2017-06-25 LAB — BASIC METABOLIC PANEL
ANION GAP: 12 (ref 5–15)
BUN: 12 mg/dL (ref 6–20)
CALCIUM: 8.4 mg/dL — AB (ref 8.9–10.3)
CO2: 17 mmol/L — AB (ref 22–32)
Chloride: 118 mmol/L — ABNORMAL HIGH (ref 101–111)
Creatinine, Ser: 0.93 mg/dL (ref 0.61–1.24)
GFR calc non Af Amer: >60 mL/min (ref >60)
GLUCOSE: 81 mg/dL (ref 65–99)
POTASSIUM: 4 mmol/L (ref 3.5–5.1)
Sodium: 147 mmol/L — ABNORMAL HIGH (ref 135–145)

## 2017-06-25 LAB — CBC
HEMATOCRIT: 28.5 % — AB (ref 39.0–52.0)
HEMOGLOBIN: 10.2 g/dL — AB (ref 13.0–17.0)
MCH: 35.3 pg — ABNORMAL HIGH (ref 26.0–34.0)
MCHC: 35.8 g/dL (ref 30.0–36.0)
MCV: 98.6 fL (ref 78.0–100.0)
Platelets: 387 10*3/uL (ref 150–400)
RBC: 2.89 MIL/uL — AB (ref 4.22–5.81)
RDW: 16.4 % — ABNORMAL HIGH (ref 11.5–15.5)
WBC: 13.7 10*3/uL — AB (ref 4.0–10.5)

## 2017-06-25 LAB — RPR: RPR Ser Ql: NONREACTIVE

## 2017-06-25 LAB — MAGNESIUM: Magnesium: 2 mg/dL (ref 1.7–2.4)

## 2017-06-25 LAB — CORTISOL-AM, BLOOD: CORTISOL - AM: 14.8 ug/dL (ref 6.7–22.6)

## 2017-06-25 LAB — PROCALCITONIN

## 2017-06-25 MED ORDER — VITAMIN B-1 100 MG PO TABS: 100.0000 mg | ORAL_TABLET | Freq: Every day | ORAL | Status: DC

## 2017-06-25 MED ORDER — HALOPERIDOL LACTATE 5 MG/ML IJ SOLN
1.0000 mg | Freq: Once | INTRAMUSCULAR | Status: AC
  Administered 2017-06-25: 1 mg via INTRAVENOUS
  Filled 2017-06-25: qty 1

## 2017-06-25 MED ORDER — THIAMINE HCL 100 MG/ML IJ SOLN
500.0000 mg | Freq: Three times a day (TID) | INTRAVENOUS | Status: DC
  Administered 2017-06-25 – 2017-06-28 (×8): 500 mg via INTRAVENOUS
  Filled 2017-06-25 (×9): qty 5

## 2017-06-25 MED ORDER — HALOPERIDOL LACTATE 5 MG/ML IJ SOLN
2.5000 mg | Freq: Once | INTRAMUSCULAR | Status: AC
  Administered 2017-06-25: 2.5 mg via INTRAVENOUS
  Filled 2017-06-25: qty 1

## 2017-06-25 MED ORDER — DEXTROSE-NACL 5-0.45 % IV SOLN
INTRAVENOUS | Status: DC
  Administered 2017-06-25: 11:00:00 via INTRAVENOUS

## 2017-06-25 MED ORDER — HALOPERIDOL LACTATE 5 MG/ML IJ SOLN
5.0000 mg | Freq: Once | INTRAMUSCULAR | Status: AC
Start: 2017-06-25 — End: 2017-06-25
  Administered 2017-06-25: 5 mg via INTRAVENOUS
  Filled 2017-06-25: qty 1

## 2017-06-25 MED ORDER — TRAZODONE HCL 100 MG PO TABS
150.0000 mg | ORAL_TABLET | Freq: Every evening | ORAL | Status: DC
Start: 2017-06-25 — End: 2017-06-29
  Administered 2017-06-25 – 2017-06-26 (×2): 150 mg via ORAL
  Filled 2017-06-25 (×3): qty 1

## 2017-06-25 NOTE — Progress Notes (Signed)
EEG Completed; Results Pending  

## 2017-06-25 NOTE — Procedures (Signed)
HPI:  84 y/o with MS change  TECHNICAL SUMMARY:  A multichannel referential and bipolar montage EEG using the standard international 10-20 system was performed on the patient described as minimally responsive to the technician.  The dominant background activity consists of 6 hertz activity seen most prominantly over the posterior head region.  Much muscle/myogenic artifact is noted throughout the recording and sometimes interferes with interpretation.    ACTIVATION:  Stepwise photic stimulation and hyperventilation are not performed.  EPILEPTIFORM ACTIVITY:  There were no obvious spikes, sharp waves or paroxysmal activity.  SLEEP: No sleep is noted  CARDIAC:  The EKG lead revealed was not well recorded.    IMPRESSION:  This is an abnormal EEG, albeit limited, demonstrating a mild to moderate diffuse slowing of electrocerebral activity.  This can be seen in a wide variety of encephalopathic state including those of a toxic, metabolic, or degenerative nature.  There were no focal, hemispheric, or lateralizing features.  No epileptiform activity was recorded.  As above, this EEG/interpretation was limited by a significant amount of muscle and myogenic artifact during portions of the recording.  Correlate with clinical exam.

## 2017-06-25 NOTE — Progress Notes (Signed)
Patient has not voided tonight, bladder scan completed and patient has >972 ml urine in bladder.  RN paged C. Bodenheimer, NP and reported urinary retention, order to reinsert Foley catheter was received. P.J. Henderson NewcomerSexton, RN

## 2017-06-25 NOTE — Progress Notes (Signed)
Visit from chaplain was requested.  Patient had fallen a few months ago and broke his hip.  Wife has been caring for him and it is too much.  The dementia makes things more challenging.  Spouse Steward DroneBrenda very emotional realizing he needs more care than she can give.  Had prayer with patient and wife. Shared with wife that chaplains are on call and she can request a chaplain anytime. Phebe CollaDonna S Fleurette Woolbright, Chaplain   06/25/17 1000  Clinical Encounter Type  Visited With Patient and family together  Visit Type Initial;Spiritual support;Social support  Referral From Patient  Consult/Referral To Chaplain  Spiritual Encounters  Spiritual Needs Prayer;Emotional  Stress Factors  Patient Stress Factors Health changes  Family Stress Factors Exhausted

## 2017-06-25 NOTE — Progress Notes (Signed)
Initial Nutrition Assessment  DOCUMENTATION CODES:   Severe malnutrition in context of chronic illness  INTERVENTION:  1. Ensure Enlive po BID, each supplement provides 350 kcal and 20 grams of protein   NUTRITION DIAGNOSIS:   Severe Malnutrition related to chronic illness as evidenced by severe fat depletion, severe muscle depletion.   GOAL:   Patient will meet greater than or equal to 90% of their needs  MONITOR:   PO intake, Labs, I & O's, Supplement acceptance, Weight trends  REASON FOR ASSESSMENT:   Consult Assessment of nutrition requirement/status  ASSESSMENT:   Mr. Julien GirtMcDaniel has a PMH of ETOH abuse with recent cessation, HTN, previously had IM nail 04/28/2017, now presents with alcohol induced psychotic disorder with hallucinations.  Spoke with patient's wife at bedside, patient unable to provide any history. Wife states at home he was eating scrambled eggs, grits, bacon, and french toast or pancakes for breakfast. Will eat egg or chicken salad sandwich for lunch. Will eat chicken or beef with 2 vegetables for dinner. Wife was agreeable to drinking ensure. Believes his mentation is improving. Was gaining weight at home, though some of this can be attributed to fluid retention. Despite weight gain, still exhibits 13 pound/8.1% severe weight loss over 2 months per chart.  Labs reviewed:  Na 147  Medications reviewed and include:  B and C Complex Vitamins, Ca-Vit D, Folic Acid, MVI, 40 K+, Thiamine D5 1/2 NS at 6575mL/hr --> 306 calories  NUTRITION - FOCUSED PHYSICAL EXAM:    Most Recent Value  Orbital Region  Moderate depletion  Upper Arm Region  Severe depletion  Thoracic and Lumbar Region  Moderate depletion  Buccal Region  Moderate depletion  Temple Region  Moderate depletion  Clavicle Bone Region  Moderate depletion  Clavicle and Acromion Bone Region  Moderate depletion  Scapular Bone Region  Unable to assess  Dorsal Hand  Unable to assess  Patellar Region   Severe depletion  Anterior Thigh Region  Severe depletion  Posterior Calf Region  Severe depletion  Edema (RD Assessment)  None  Hair  Reviewed  Eyes  Reviewed  Mouth  Reviewed  Skin  Reviewed  Nails  Reviewed       Diet Order:  Diet regular Room service appropriate? Yes; Fluid consistency: Thin  EDUCATION NEEDS:   Not appropriate for education at this time  Skin:  Skin Assessment: Reviewed RN Assessment  Last BM:  06/20/2017  Height:   Ht Readings from Last 1 Encounters:  06/23/17 5\' 7"  (1.702 m)    Weight:   Wt Readings from Last 1 Encounters:  06/23/17 147 lb 0.8 oz (66.7 kg)    Ideal Body Weight:  67.27 kg  BMI:  Body mass index is 23.03 kg/m.  Estimated Nutritional Needs:   Kcal:  1600-1731 calories (MSJ x1.2-1.3)  Protein:  100-114 grams (1.5-1.7g/kg)  Fluid:  1.6-1.8L   Dionne AnoWilliam M. Cynda Soule, MS, RD LDN Inpatient Clinical Dietitian Pager [phone]

## 2017-06-25 NOTE — Evaluation (Signed)
Occupational Therapy Evaluation Patient Details Name: Elijah Nixon MRN: [id number] DOB: 1932/08/10 Today's Date: 06/25/2017    History of Present Illness 84 y.o. male with medical history significant recent alcohol cessation, hypertension, IM nail on 04/28/2017, who presents the emergency department complaining of hallucinations. 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    Clinical Impression   PTA, pt had returned home from rehabilitation after R femur fracture and was able to ambulate and complete basic ADL with RW. Pt currently requires total assistance for ADL participation this session and is limited by decreased cognition, decreased motor planning skills, generalized weakness, and decreased awareness. Pt was pleasant and conversant with therapist despite expressive difficulties but very restless throughout. He additionally demonstrates poorly coordinated BUE movement with significant flexion posture. Pt would benefit from continued OT services while admitted. At current functional level, recommend SNF level rehabilitation in order to maximize return to modified independent PLOF.     Follow Up Recommendations  SNF;Supervision/Assistance - 24 hour    Equipment Recommendations  (TBD at next venue of care)    Recommendations for Other Services       Precautions / Restrictions Precautions Precautions: Fall Precaution Comments: Very confused Restrictions Weight Bearing Restrictions: Yes RLE Weight Bearing: Weight bearing as tolerated Other Position/Activity Restrictions: R hip fx 04/2017      Mobility Bed Mobility Overal bed mobility: Needs Assistance Bed Mobility: Rolling Rolling: Max assist         General bed mobility comments: Max assist for all movement in bed including rolling this session. Unable to complete adequate motor planning for task at hand and unable to reach upright despite max assist from OT.    Transfers                 General transfer comment: unsafe to attempt    Balance                                           ADL either performed or assessed with clinical judgement   ADL Overall ADL's : Needs assistance/impaired                                       General ADL Comments: Total assistance at this time secondary to cognitive and motor planning deficits.      Vision   Additional Comments: Need to continue to assess visual information as pt unable to report this session. Able to track therapist and make eye contact.      Perception     Praxis      Pertinent Vitals/Pain Pain Assessment: Faces Faces Pain Scale: Hurts even more Pain Location: catheter site; R hip Pain Descriptors / Indicators: Discomfort;Grimacing;Guarding Pain Intervention(s): Limited activity within patient's tolerance;Monitored during session;Repositioned     Hand Dominance     Extremity/Trunk Assessment Upper Extremity Assessment Upper Extremity Assessment: Generalized weakness;Difficult to assess due to impaired cognition(jerking movements at times; poorly coordinated)   Lower Extremity Assessment Lower Extremity Assessment: Difficult to assess due to impaired cognition       Communication Communication Communication: Expressive difficulties   Cognition Arousal/Alertness: Awake/alert Behavior During Therapy: Restless;Agitated Overall Cognitive Status: Impaired/Different from baseline Area of Impairment: Orientation;Attention;Memory;Following commands;Awareness;Safety/judgement;Problem solving  Orientation Level: Disoriented to;Situation Current Attention Level: Selective Memory: Decreased short-term memory Following Commands: Follows one step commands inconsistently;Follows one step commands with increased time Safety/Judgement: Decreased awareness of deficits Awareness: Intellectual Problem Solving: Slow  processing;Decreased initiation;Difficulty sequencing;Requires verbal cues General Comments: Pt minimally able to participate this session. He did answer questions but limited by poor ability to plan movements.    General Comments  Pt with gasping breathing despite SpO2 97%.     Exercises     Shoulder Instructions      Home Living Family/patient expects to be discharged to:: Private residence Living Arrangements: Spouse/significant other Available Help at Discharge: Family;Available 24 hours/day Type of Home: House Home Access: Stairs to enter [employer] CorporationEntrance Stairs-Number of Steps: 1   Home Layout: One level     Bathroom Shower/Tub: Walk-in shower;Door   Foot LockerBathroom Toilet: Standard     Home Equipment: Environmental consultantWalker - 2 wheels;Shower seat - built in;Hand held shower head;Grab bars - toilet;Grab bars - tub/shower   Additional Comments: Information from PT note due to pt unable to report PLOF or home information to OT.       Prior Functioning/Environment Level of Independence: Independent with assistive device(s)        Comments: ambulating with RW        OT Problem List: Decreased strength;Decreased range of motion;Decreased activity tolerance;Impaired balance (sitting and/or standing);Decreased safety awareness;Decreased knowledge of use of DME or AE;Decreased knowledge of precautions;Decreased cognition;Impaired vision/perception;Decreased coordination;Cardiopulmonary status limiting activity;Pain;Impaired UE functional use      OT Treatment/Interventions: Self-care/ADL training;Therapeutic exercise;Energy conservation;DME and/or AE instruction;Patient/family education;Balance training;Therapeutic activities;Cognitive remediation/compensation;Visual/perceptual remediation/compensation    OT Goals(Current goals can be found in the care plan section) Acute Rehab OT Goals Patient Stated Goal: none stated  OT Goal Formulation: With patient Time For Goal Achievement: 07/09/17 Potential  to Achieve Goals: Fair ADL Goals Pt Will Perform Grooming: with min assist;sitting Pt Will Transfer to Toilet: with max assist;stand pivot transfer;bedside commode Pt Will Perform Toileting - Clothing Manipulation and hygiene: with max assist;sit to/from stand Pt/caregiver will Perform Home Exercise Program: Both right and left upper extremity;Increased strength;With written HEP provided;With minimal assist(increased coordination) Additional ADL Goal #1: Pt will complete bed mobility in preparation for ADL participation with overall min assist and 2 cues for motor planning. Additional ADL Goal #2: Pt will demonstrate emergent awareness during ADL participation.  OT Frequency: Min 1X/week   Barriers to D/C:            Co-evaluation              AM-PAC PT "6 Clicks" Daily Activity     Outcome Measure Help from another person eating meals?: Total Help from another person taking care of personal grooming?: Total Help from another person toileting, which includes using toliet, bedpan, or urinal?: Total Help from another person bathing (including washing, rinsing, drying)?: Total Help from another person to put on and taking off regular upper body clothing?: Total Help from another person to put on and taking off regular lower body clothing?: Total 6 Click Score: 6   End of Session Nurse Communication: Mobility status(gasping breathing sounds)  Activity Tolerance: Patient tolerated treatment well Patient left: in bed;with call bell/phone within reach;with bed alarm set(with Mitts re-applied)  OT Visit Diagnosis: Other abnormalities of gait and mobility (R26.89);Pain;Other symptoms and signs involving cognitive function Pain - Right/Left: Right Pain - part of body: Hip                Time: [phone]  OT Time Calculation (min): 19 min Charges:  OT General Charges $OT Visit: 1 Visit OT Evaluation $OT Eval Moderate Complexity: 1 Mod G-Codes:     Doristine Section, MS OTR/L   Pager: [phone]   Zamyah Wiesman A Isaura Schiller 06/25/2017, 8:45 AM

## 2017-06-25 NOTE — Progress Notes (Addendum)
Subjective: Dysarthric but seems to understand questions. Will nod head yes/no appropriately. Denies pain or hallucinations today. He feels tired. He denies any difficulty swallowing foods/liquids. No family at bedside. He was given Haldol 2.5 mg and 5 mg IV overnight.   Exam: Vitals:   06/25/17 0400 06/25/17 0917  BP: (!) 122/100 126/60  Pulse: 71 91  Resp: 14 14  Temp: (!) 96.1 F (35.6 C) (!) 97.4 F (36.3 C)  SpO2: 98% 100%   Gen: In bed, elderly ill-appearing man, somnolent, mittens on both hands Cardiac: RRR Resp: non-labored breathing, no acute distress Abd: soft, nt GU: Foley in place, dark urine in collecting bag  Neuro: MS: somnolent but easily arousable, seems to understand and follows commands, nods/head yes or no to questions; otherwise with dysarthria CN: Pupils ~651mm, equal, round, and reactive to light. EOMI.  Motor: Neck flexion 5/5, shoulder shrug 5/5, B/l UEs 5/5 at biceps & triceps, LLE 5/5 with hip flexion, knee extension, ADF, APF; RLE 4/5 with hip flexion, knee extension, 5/5 R ADF & APF. Tone is rigid in upper extremities with cogwheeling LUE Sensory: Intact and symmetric to light touch on face, arms, and legs DTR: +2 patellar reflexes  Pertinent Labs: Ammonia 18 TSH 5.441, free T4 0.80 B12 1,132 RPR non-reactive AM Cortisol 14.8 B1, Copper pending  EEG 3/11: This is an abnormal EEG, albeit limited, demonstrating a mild to moderate diffuse slowing of electrocerebral activity.  This can be seen in a wide variety of encephalopathic state including those of a toxic, metabolic, or degenerative nature.  There were no focal, hemispheric, or lateralizing features.  No epileptiform activity was recorded. This EEG/interpretation was limited by a significant amount of muscle and myogenic artifact during portions of the recording.     Impression:  84 year old male with history of long standing ETOH abuse who quit drinking on April 28, 2017 when he was  hospitalized for a Right hip fracture s/p IM nail, Hx of Anemia, HTN, BPH, and Grover's disease who presented with worsening hallucinations since alcohol cessation and ongoing neurocognitive decline and memory deficits over the last year. He has recently been treated for left knee cellulitis with Keflex as an outpatient. He was noted to be hypothermic to 93 F on admission.  Current differentials include chronic alcohol induced hallucinosis and/or delirium in the setting of recent infection and/or pain. Wernicke/Korsakoff type syndrome is also a possibility given his chronic alcohol use. Would also consider Lewy Body Dementia, however his tactile hallucinations are more consistent with alcohol withdrawal/hallucinosis.   No seizures seen on EEG; diffuse slowing was noted.  MRI brain overnight was severely motion degraded.  Neurosyphilis is less likely with non-reactive RPR. AM Cortisol is not suggestive of adrenal insufficiency. 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  Recommendations: 1) SLP eval 2) MRI severely motion degraded - consider reimaging when able to cooperate 3) Consider high dose thiamine (500 mg IV TID for 2-3 days) 4) Avoid typical antipsychotics such as Haldol. If absolutely necessary, then low dose Seroquel would be a good choice 5) Evaluate for possible indolent bacterial infection - PCT pending  Darreld McleanVishal Patel, MD Internal Medicine PGY-3  Electronically signed: Dr. Caryl PinaEric Brylinn Teaney

## 2017-06-25 NOTE — Progress Notes (Addendum)
PROGRESS NOTE    Mordecai RasmussenBilly W Belvedere  ZOX:[id number] DOB: 07/02/1932 DOA: 06/23/2017 PCP: [name], [name], MD  Brief Narrative: [name] is a 84 year old male with long-standing history of alcohol use -6pack of beer/day for 40-50years, hypertension, hip fracture in January/treated with intramedullary nail, this is when he quit drinking, hospitalization then complicated by alcohol withdrawal, then went to rehabilitation -Went home from rehabilitation 3 weeks ago and has been having hallucinations since then however much worse in the last 10 days. -In addition wife also reports problems with short-term memory and cognitive/functional decline for approximately 1 year. She reports that he had hallucinations while in rehabilitation as well however they were not this severe. -He has not had access to alcohol since 1/12  Assessment & Plan:   1. 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 ETOH abuse -drinking a 6pack of beer/day for 40-50years -stop drinking in January at  admission with Hip Fracture -Continue Thiamine  3. HTN -BP soft but stable, stop amlodipine  4.  Chronic Grovers disease: Patient currently with rash  -on topical steroids for this, per wife his rash is stable and long standing -suspect this is causing his chronic leukocytosis and mild eosinophilia  DVT prophylaxis:lovenox Code Status: DNR Family Communication: Wife at bedside Disposition Plan: Will need SNF  Consultants:   NEuro  Psych   Procedures:   Antimicrobials:    Subjective: -Agitated last night, got 2 doses of IV Haldol  Objective: Vitals:   06/25/17 0003 06/25/17 0400 06/25/17 0855 06/25/17 0917  BP: 116/63 (!) 122/100 117/71 126/60  Pulse:  71 94 91  Resp:  14 19 14   Temp: (!) 95.2 F (35.1 C) (!) 96.1 F (35.6 C)  (!) 97.4 F (36.3 C)  TempSrc:  Axillary  Axillary  SpO2:  98% 99% 100%  Weight:      Height:        Intake/Output Summary (Last 24 hours) at 06/25/2017 1159 Last data filed at 06/25/2017 0300 Gross per 24 hour  Intake 1211.25 ml  Output -  Net 1211.25 ml   Filed Weights   06/23/17 1003 06/23/17 1713  Weight: 65.8 kg (145 lb) 66.7 kg (147 lb 0.8 oz)    Examination:  Gen: Somnolent, arousable, mumbles a few words, no distress/confused HEENT: Pupils equal and reactive, no nystagmus noted Lungs: Good air movement, decreased breath sounds at both bases CVS: RRR,No Gallops,Rubs or new Murmurs Abd: soft, Non tender, non distended, BS present Extremities: No edema, quarter-sized area of erythema over left knee, no evidence of knee effusion, no warmth and tenderness noted, full range of motion elicited Skin: Diffuse maculopapular skin rash which is chronic Psych:  Agitated and confused Neurology: Confused/drowsy, moves all extremities, no localizing signs  Data Reviewed:   CBC: Recent Labs  Lab 06/18/17 1126 06/23/17 1010 06/24/17 0623 06/25/17 0617  WBC 13.7* 12.1* 12.5* 13.7*  NEUTROABS 8.4* 7.2  --   --   HGB 11.3*  11.0* 10.4* 10.2*  HCT 33.6* 30.4* 28.6* 28.5*  MCV 94.4 95.6 97.6 98.6  PLT 454.0* 418* 411* 387   Basic Metabolic Panel: Recent Labs  Lab 06/18/17 1126 06/23/17 1010 06/24/17 0623 06/24/17 1706 06/25/17 0617  NA 139 139 143  --  147*  K 4.4 4.4 4.3  --  4.0  CL 108 111 116*  --  118*  CO2 24 18* 17*  --  17*  GLUCOSE 93 92 69  --  81  BUN 26* 17 12  --  12  CREATININE 0.75 0.84 0.88  --  0.93  CALCIUM 9.1 8.7* 8.2*  --  8.4*  MG  --   --   --   --  2.0  PHOS  --   --   --  3.9  --    GFR: Estimated Creatinine Clearance: 57.3 mL/min (by C-G formula based on SCr of 0.93 mg/dL). Liver Function Tests: Recent Labs  Lab 06/23/17 1010 06/24/17 1706  AST 46*  --   ALT 54  --   ALKPHOS 157* 145*  BILITOT 0.5  --   PROT 5.8*  --   ALBUMIN 2.6*  --    No results for input(s): LIPASE, AMYLASE in the last 168 hours. Recent Labs  Lab 06/23/17 1315  AMMONIA 18   Coagulation Profile: No results for input(s): INR, PROTIME in the last 168 hours. Cardiac Enzymes: No results for input(s): CKTOTAL, CKMB, CKMBINDEX, TROPONINI in the last 168 hours. BNP (last 3 results) No results for input(s): PROBNP in the last 8760 hours. HbA1C: No results for input(s): HGBA1C in the last 72 hours. CBG: No results for input(s): GLUCAP in the last 168 hours. Lipid Profile: No results for input(s): CHOL, HDL, LDLCALC, TRIG, CHOLHDL, LDLDIRECT in the last 72 hours. Thyroid Function Tests: Recent Labs    06/23/17 1518 06/24/17 0919  TSH 5.441*  --   FREET4  --  0.80   Anemia Panel: Recent Labs    06/24/17 0919  VITAMINB12 1,132*   Urine analysis:    Component Value Date/Time   COLORURINE GREEN (A) 06/25/2017 0303   APPEARANCEUR TURBID (A) 06/25/2017 0303   LABSPEC 1.015 06/25/2017 0303   PHURINE 5.0 06/25/2017 0303   GLUCOSEU NEGATIVE 06/25/2017 0303   HGBUR LARGE (A) 06/25/2017 0303   BILIRUBINUR NEGATIVE 06/25/2017 0303   BILIRUBINUR Negative 06/19/2017 1419   KETONESUR 5  (A) 06/25/2017 0303   PROTEINUR 100 (A) 06/25/2017 0303   UROBILINOGEN 0.2 06/19/2017 1419   NITRITE NEGATIVE 06/25/2017 0303   LEUKOCYTESUR NEGATIVE 06/25/2017 0303   Sepsis Labs: @LABRCNTIP (procalcitonin:4,lacticidven:4)  ) Recent Results (from the past 240 hour(s))  Urine Culture     Status: None   Collection Time: 06/19/17  2:32 PM  Result Value Ref Range Status   MICRO NUMBER: [id number]  Final   SPECIMEN QUALITY: ADEQUATE  Final   Sample Source URINE  Final   STATUS: FINAL  Final   Result: No Growth  Final  MRSA PCR Screening     Status: None   Collection Time: 06/23/17  8:32 PM  Result Value Ref Range Status   MRSA by PCR NEGATIVE NEGATIVE Final    Comment:        The GeneXpert MRSA Assay (FDA approved for NASAL  specimens only), is one component of a comprehensive MRSA colonization surveillance program. It is not intended to diagnose MRSA infection nor to guide or monitor treatment for MRSA infections. Performed at [facility], [address], [place], [place] [address]          Radiology Studies: Mr Brain 53 Contrast  Result Date: 06/24/2017 CLINICAL DATA:  84 y/o M; altered level of consciousness unexplained. Lower extremity weakness and hallucinations. EXAM: MRI HEAD WITHOUT CONTRAST TECHNIQUE: Extensive patient motion. Axial DWI, coronal DWI, axial T2 FLAIR sequences were acquired. Patient was unable to continue. COMPARISON:  06/23/2017 CT head. FINDINGS: Severely motion degraded DWI and T2 FLAIR weighted sequences. No gross diffusion signal abnormality or focal mass effect. Brain parenchymal volume loss as seen on prior CT. IMPRESSION: Severely motion degraded DWI and T2 FLAIR sequences. Patient was unable to continue. Repeat imaging recommended when patient is able to follow commands and hold still. Electronically Signed   By: Mitzi Hansen M.D.   On: 06/24/2017 01:05        Scheduled Meds: . aspirin EC  81 mg Oral Daily  . B-complex  with vitamin C  1 tablet Oral Daily  . calcium-vitamin D  1 tablet Oral Q breakfast  . doxazosin  8 mg Oral Daily  . enoxaparin (LOVENOX) injection  40 mg Subcutaneous Q24H  . folic acid  1 mg Oral Daily  . multivitamin  1 tablet Oral Daily  . potassium chloride SA  40 mEq Oral BID  . risperiDONE  0.5 mg Oral BID  . sodium chloride flush  3 mL Intravenous Q12H  . thiamine injection  100 mg Intravenous Daily  . traZODone  150 mg Oral QPM   Continuous Infusions: . dextrose 5 % and 0.45% NaCl 75 mL/hr at 06/25/17 1108     LOS: 0 days    Time spent:    Zannie Cove, MD [facility] Page via [email or web address], password [email or web address] After 7PM please contact night-coverage  06/25/2017, 11:59 AM

## 2017-06-25 NOTE — Progress Notes (Addendum)
RN paged C. Bodenheimer, NP to make him aware that patient did not receive any 2200 medications due to refusal to take medications and swallow any water.  Patient became very agitated when RN attempted to give medication.  Also, Bair Hugger had been removed earlier as patient's temp had gone up to 97.5.  However, temp now is 95.2 and Bair Hugger has been reapplied.  Elijah Boll. Bodenheimer, NP returned call from RN and is aware of patient's condition.  NP states he will order patient one time dose of Haldol if needed due to agitation.  Patient is currently resting and quiet, so Haldol will only be given if needed.  P.J. Henderson NewcomerSexton, RN

## 2017-06-25 NOTE — Progress Notes (Signed)
RN paged C. Bodenheimer, NP to make him aware that patient drained over 1000 ml black/ green urine after Foley catheter placed.  Order received to obtain U/A, specimen obtained and is being sent per order.  P.J. Henderson NewcomerSexton, RN

## 2017-06-26 ENCOUNTER — Observation Stay (HOSPITAL_COMMUNITY): Payer: Medicare Other

## 2017-06-26 DIAGNOSIS — Z833 Family history of diabetes mellitus: Secondary | ICD-10-CM | POA: Diagnosis not present

## 2017-06-26 DIAGNOSIS — H3530 Unspecified macular degeneration: Secondary | ICD-10-CM | POA: Diagnosis present

## 2017-06-26 DIAGNOSIS — Z6824 Body mass index (BMI) 24.0-24.9, adult: Secondary | ICD-10-CM | POA: Diagnosis not present

## 2017-06-26 DIAGNOSIS — E876 Hypokalemia: Secondary | ICD-10-CM | POA: Diagnosis present

## 2017-06-26 DIAGNOSIS — J690 Pneumonitis due to inhalation of food and vomit: Secondary | ICD-10-CM | POA: Diagnosis not present

## 2017-06-26 DIAGNOSIS — Z515 Encounter for palliative care: Secondary | ICD-10-CM | POA: Diagnosis present

## 2017-06-26 DIAGNOSIS — E43 Unspecified severe protein-calorie malnutrition: Secondary | ICD-10-CM | POA: Diagnosis present

## 2017-06-26 DIAGNOSIS — F10951 Alcohol use, unspecified with alcohol-induced psychotic disorder with hallucinations: Secondary | ICD-10-CM | POA: Diagnosis not present

## 2017-06-26 DIAGNOSIS — R410 Disorientation, unspecified: Secondary | ICD-10-CM | POA: Diagnosis present

## 2017-06-26 DIAGNOSIS — I10 Essential (primary) hypertension: Secondary | ICD-10-CM | POA: Diagnosis not present

## 2017-06-26 DIAGNOSIS — F10259 Alcohol dependence with alcohol-induced psychotic disorder, unspecified: Secondary | ICD-10-CM | POA: Diagnosis present

## 2017-06-26 DIAGNOSIS — G9341 Metabolic encephalopathy: Secondary | ICD-10-CM | POA: Diagnosis present

## 2017-06-26 DIAGNOSIS — F10251 Alcohol dependence with alcohol-induced psychotic disorder with hallucinations: Secondary | ICD-10-CM | POA: Diagnosis present

## 2017-06-26 DIAGNOSIS — E0781 Sick-euthyroid syndrome: Secondary | ICD-10-CM | POA: Diagnosis present

## 2017-06-26 DIAGNOSIS — Z7982 Long term (current) use of aspirin: Secondary | ICD-10-CM | POA: Diagnosis not present

## 2017-06-26 DIAGNOSIS — R4701 Aphasia: Secondary | ICD-10-CM | POA: Diagnosis present

## 2017-06-26 DIAGNOSIS — D721 Eosinophilia: Secondary | ICD-10-CM | POA: Diagnosis present

## 2017-06-26 DIAGNOSIS — E870 Hyperosmolality and hypernatremia: Secondary | ICD-10-CM | POA: Diagnosis not present

## 2017-06-26 DIAGNOSIS — Z66 Do not resuscitate: Secondary | ICD-10-CM | POA: Diagnosis present

## 2017-06-26 DIAGNOSIS — R296 Repeated falls: Secondary | ICD-10-CM | POA: Diagnosis present

## 2017-06-26 DIAGNOSIS — E860 Dehydration: Secondary | ICD-10-CM | POA: Diagnosis present

## 2017-06-26 DIAGNOSIS — L111 Transient acantholytic dermatosis [Grover]: Secondary | ICD-10-CM | POA: Diagnosis present

## 2017-06-26 LAB — CBC WITH DIFFERENTIAL/PLATELET
BASOS ABS: 0 10*3/uL (ref 0.0–0.1)
BASOS PCT: 0 %
Eosinophils Absolute: 2.7 10*3/uL — ABNORMAL HIGH (ref 0.0–0.7)
Eosinophils Relative: 16 %
HCT: 29.2 % — ABNORMAL LOW (ref 39.0–52.0)
HEMOGLOBIN: 10.8 g/dL — AB (ref 13.0–17.0)
LYMPHS PCT: 9 %
Lymphs Abs: 1.6 10*3/uL (ref 0.7–4.0)
MCH: 36.1 pg — ABNORMAL HIGH (ref 26.0–34.0)
MCHC: 37 g/dL — ABNORMAL HIGH (ref 30.0–36.0)
MCV: 97.7 fL (ref 78.0–100.0)
Monocytes Absolute: 0.7 10*3/uL (ref 0.1–1.0)
Monocytes Relative: 4 %
NEUTROS ABS: 12.3 10*3/uL — AB (ref 1.7–7.7)
NEUTROS PCT: 71 %
Platelets: 400 10*3/uL (ref 150–400)
RBC: 2.99 MIL/uL — AB (ref 4.22–5.81)
RDW: 16.6 % — AB (ref 11.5–15.5)
WBC: 17.4 10*3/uL — AB (ref 4.0–10.5)

## 2017-06-26 LAB — COMPREHENSIVE METABOLIC PANEL
ALBUMIN: 2.3 g/dL — AB (ref 3.5–5.0)
ALK PHOS: 148 U/L — AB (ref 38–126)
ALT: 52 U/L (ref 17–63)
AST: 45 U/L — AB (ref 15–41)
Anion gap: 11 (ref 5–15)
BILIRUBIN TOTAL: 0.9 mg/dL (ref 0.3–1.2)
BUN: 9 mg/dL (ref 6–20)
CO2: 19 mmol/L — ABNORMAL LOW (ref 22–32)
CREATININE: 0.9 mg/dL (ref 0.61–1.24)
Calcium: 8.6 mg/dL — ABNORMAL LOW (ref 8.9–10.3)
Chloride: 119 mmol/L — ABNORMAL HIGH (ref 101–111)
GFR calc Af Amer: >60 mL/min (ref >60)
GFR calc non Af Amer: >60 mL/min (ref >60)
GLUCOSE: 134 mg/dL — AB (ref 65–99)
POTASSIUM: 4.1 mmol/L (ref 3.5–5.1)
Sodium: 149 mmol/L — ABNORMAL HIGH (ref 135–145)
TOTAL PROTEIN: 5.6 g/dL — AB (ref 6.5–8.1)

## 2017-06-26 LAB — PROCALCITONIN

## 2017-06-26 MED ORDER — POTASSIUM CHLORIDE CRYS ER 20 MEQ PO TBCR: 40.0000 meq | EXTENDED_RELEASE_TABLET | Freq: Every day | ORAL | Status: DC

## 2017-06-26 MED ORDER — SODIUM CHLORIDE 0.9 % IV SOLN
3.0000 g | Freq: Four times a day (QID) | INTRAVENOUS | Status: DC
  Administered 2017-06-26 – 2017-06-28 (×8): 3 g via INTRAVENOUS
  Filled 2017-06-26 (×11): qty 3

## 2017-06-26 MED ORDER — DEXTROSE 5 % IV SOLN
INTRAVENOUS | Status: DC
  Administered 2017-06-26 – 2017-06-27 (×2): via INTRAVENOUS
  Administered 2017-06-28: 1000 mL via INTRAVENOUS

## 2017-06-26 NOTE — Progress Notes (Addendum)
Subjective: Continues to have expressive aphasia, but understands. More awake this morning. Wife is at bedside. She reports significant changes in gait, hallucinations, and deficits in expression occurring after his right hip injury/surgery and hospital/rehab stays. Prior to the injury she does feel that he was having more noticeable issues with memory over the last year but no difficulty in ADLs. She did notice some shuffling of gait prior to the injury which she felt led to his fall. She handles all finances. He does not drive due to macular degeneration. He was given Haldol 1 mg IV yesterday evening.   Exam: Vitals:   06/26/17 0800 06/26/17 0913  BP:  (!) 107/49  Pulse:    Resp:  19  Temp: (!) 96 F (35.6 C)   SpO2:     Gen: In bed, elderly ill-appearing man, mittens on both hands Cardiac: RRR Resp: non-labored breathing, no acute distress Abd: soft, nt GU: Foley in place  Neuro: Ment: awake and alert, understands and follows commands, nods/head yes or no to questions; otherwise with aphasia CN: PERRL. EOMI.  Motor: Neck flexion 5/5, shoulder shrug 5/5, B/l UEs 5/5 at biceps & triceps, LLE 5/5 with hip flexion, knee extension, ADF, APF; RLE 4/5 with hip flexion, knee extension, 5/5 R ADF & APF. Tone is improved without rigidity or cogwheeling. Sensory: Intact and symmetric to light touch on face, arms, and legs DTR: +2 patellar reflexes  Pertinent Labs: Ammonia 18 TSH 5.441, free T4 0.80 B12 1,132 RPR non-reactive AM Cortisol 14.8 B1, Copper pending  EEG 3/11: This is an abnormal EEG, albeit limited, demonstrating a mild to moderate diffuse slowing of electrocerebral activity.  This can be seen in a wide variety of encephalopathic state including those of a toxic, metabolic, or degenerative nature. There were no focal, hemispheric, or lateralizing features.  No epileptiform activity was recorded. This EEG/interpretation was limited by a significant amount of muscle and myogenic  artifact during portions of the recording.     Impression:  [age] year old male with history of long standing ETOH abuse who quit drinking on April 28, 2017 when he was hospitalized for a right hip fracture s/p IM nail, Hx of Anemia, HTN, BPH, and Grover's disease who presented with worsening hallucinations since alcohol cessation and ongoing neurocognitive decline and memory deficits over the last year. He has recently been treated for left knee cellulitis with Keflex as an outpatient. He was noted to be hypothermic to 93 F on admission.  Most likely components of the DDx are chronic alcohol induced hallucinosis and/or delirium in the setting of recent infection and/or pain; Wernicke/Korsakoff type syndrome is also a possibility given his chronic alcohol use. Would also consider a possible underlying Lewy Body Dementia, however his tactile hallucinations are more consistent with alcohol withdrawal/hallucinosis. Per wife's report, he likely has had underlying progressive cognitive decline prior to his hip injury.  No seizures seen on EEG; diffuse slowing was noted.  MRI brain 3/10 was severely motion degraded.  Neurosyphilis is less likely with non-reactive RPR. AM Cortisol is not suggestive of adrenal insufficiency. 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 He is on Risperdal per psychiatry recommendations.  Recommendations: 1) SLP eval 2) MRI severely motion degraded - consider reimaging when able to cooperate 3) On high dose thiamine (500 mg IV TID 3 days) can continue 250 mg IV or IM once daily afterwards if responding clinically 4) Avoid typical antipsychotics such as Haldol. If absolutely necessary, then low dose Seroquel would be  a good choice 5) Neurology will sign off. Please call if there are additional questions.   Darreld Mclean, MD Internal Medicine PGY-3  Electronically signed: Dr. Caryl Pina

## 2017-06-26 NOTE — Evaluation (Signed)
Clinical/Bedside Swallow Evaluation Patient Details  Name: Elijah Nixon MRN: [id number] Date of Birth: 1933/03/25  Today's Date: 06/26/2017 Time: SLP Start Time (ACUTE ONLY): 1555 SLP Stop Time (ACUTE ONLY): 1615 SLP Time Calculation (min) (ACUTE ONLY): 20 min  Past Medical History:  Past Medical History:  Diagnosis Date  . AA (alcohol abuse)    at least 8-10 drinks/day  . Arthritis    OA  . DDD (degenerative disc disease)   . ED (erectile dysfunction)   . Grover's disease   . History of colon polyps   . History of diverticulitis of colon   . Hypertension   . Macular degeneration    dry  . Osteoporosis    spinal compression fx   Past Surgical History:  Past Surgical History:  Procedure Laterality Date  . BACK SURGERY    . INTRAMEDULLARY (IM) NAIL INTERTROCHANTERIC Right 04/28/2017   Procedure: INTRAMEDULLARY (IM) NAIL INTERTROCHANTRIC;  Surgeon: Bjorn PippinVarkey, Dax T, MD;  Location: [facility] OR;  Service: Orthopedics;  Laterality: Right;  . SPINE SURGERY  1971   deg disc disease x 2   HPI:  84 y.o. male with medical history significant recent alcohol cessation, hypertension, IM nail on 04/28/2017, who presents the emergency department complaining of hallucinations. 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    Assessment / Plan / Recommendation Clinical Impression  Pt has a cognitively based dysphagia, with reduced sustained attention, awareness, and ability to follow commands impacting his oropharyngeal function. SLP provided Max cues for cognitive skills and intermittently repositioned pt as he fidgeted in the bed. He has limited labial seal and intermittent expectoration across consistencies. Posterior transit is delayed with purees and seems passive with liquids. Suspect a delay in swallow trigger with resultant coughing associated with thin and nectar thick liquids. He does not have overt signs of aspiration with honey thick liquids  and purees, but he does remain at risk for epsiodic aspiration if he is not attending to POs. Would adjust diet to Dys 1 textures and honey thick liquids by cup or spoon under full supervision.  SLP Visit Diagnosis: Dysphagia, oropharyngeal phase (R13.12)    Aspiration Risk  Moderate aspiration risk;Risk for inadequate nutrition/hydration    Diet Recommendation Dysphagia 1 (Puree);Honey-thick liquid   Liquid Administration via: Cup;Spoon;No straw Medication Administration: Crushed with puree Supervision: Staff to assist with self feeding;Full supervision/cueing for compensatory strategies Compensations: Minimize environmental distractions;Slow rate;Small sips/bites Postural Changes: Seated upright at 90 degrees    Other  Recommendations Oral Care Recommendations: Oral care BID Other Recommendations: Order thickener from pharmacy;Prohibited food (jello, ice cream, thin soups);Remove water pitcher   Follow up Recommendations Skilled Nursing facility      Frequency and Duration min 2x/week  2 weeks       Prognosis Prognosis for Safe Diet Advancement: Fair Barriers to Reach Goals: Cognitive deficits      Swallow Study   General HPI: 84 y.o. male with medical history significant recent alcohol cessation, hypertension, IM nail on 04/28/2017, who presents the emergency department complaining of hallucinations. 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  Type of Study: Bedside Swallow Evaluation Previous Swallow Assessment: none in chart Diet Prior to this Study: Regular;Thin liquids Temperature Spikes Noted: No Respiratory Status: Room air History of Recent Intubation: No Behavior/Cognition: Alert;Requires cueing Oral Cavity Assessment: Other (comment);Dry(difficult to visualize) Oral Care Completed by SLP: No Self-Feeding Abilities: Total assist Patient Positioning: Upright in bed(as upright as  able - pt moves a lot despite  repositioning) Baseline Vocal Quality: Normal Volitional Cough: Cognitively unable to elicit Volitional Swallow: Unable to elicit    Oral/Motor/Sensory Function Overall Oral Motor/Sensory Function: (cognitively not able to formally assess)   Ice Chips Ice chips: Impaired Presentation: Spoon Oral Phase Impairments: Poor awareness of bolus Oral Phase Functional Implications: Other (comment)(expectorated) Pharyngeal Phase Impairments: Other (comments)(n/a - did not swallow)   Thin Liquid Thin Liquid: Impaired Presentation: Straw;Spoon Oral Phase Impairments: Reduced labial seal;Poor awareness of bolus;Other (comment)(could not get liquid through the straw) Oral Phase Functional Implications: Right anterior spillage;Left anterior spillage Pharyngeal  Phase Impairments: Suspected delayed Swallow;Cough - Immediate    Nectar Thick Nectar Thick Liquid: Impaired Presentation: Cup;Spoon Oral Phase Impairments: Reduced labial seal;Poor awareness of bolus Pharyngeal Phase Impairments: Suspected delayed Swallow;Cough - Immediate   Honey Thick Honey Thick Liquid: Impaired Presentation: Cup Oral Phase Impairments: Poor awareness of bolus;Reduced labial seal Pharyngeal Phase Impairments: Suspected delayed Swallow   Puree Puree: Impaired Presentation: Spoon Oral Phase Impairments: Reduced labial seal;Poor awareness of bolus Oral Phase Functional Implications: Prolonged oral transit Pharyngeal Phase Impairments: Suspected delayed Swallow   Solid   GO   Solid: Not tested        Maxcine Ham 06/26/2017,4:46 PM   Maxcine Ham, M.A. CCC-SLP [phone]

## 2017-06-26 NOTE — Progress Notes (Signed)
Physical Therapy Treatment Patient Details Name: Elijah Nixon MRN: [id number] DOB: January 27, 1933 Today's Date: 06/26/2017    History of Present Illness 84 y.o. male with medical history significant recent alcohol cessation, hypertension, IM nail on 04/28/2017, who presents the emergency department complaining of hallucinations. 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     PT Comments    Pt continues to be limited in his movement and balance by tremors with movement, decrease balance and decreased endurance and strength. Pt requires maximal multimodal cuing along with maxA for coming to seated EoB. Due to R hip pain pt keeps it flexed even in sitting only allowing L LE to touch ground. Pt requires varying assist to maintain seated balance from modA to minA. Pt only able to tolerate 3 minutes sitting EoB before requesting to lay down. D/c plans remain appropriate at this time. PT will continue to follow acutely until d/c.    Follow Up Recommendations  SNF     Equipment Recommendations  None recommended by PT    Recommendations for Other Services       Precautions / Restrictions Precautions Precautions: Fall Precaution Comments: Very confused Restrictions Weight Bearing Restrictions: Yes RLE Weight Bearing: Weight bearing as tolerated Other Position/Activity Restrictions: R hip fx 04/2017    Mobility  Bed Mobility Overal bed mobility: Needs Assistance Bed Mobility: Supine to Sit Rolling: Max assist         General bed mobility comments: Max assist for management of LE off of bed and trunk to upright  Transfers                 General transfer comment: unsafe to attempt     Balance Overall balance assessment: Needs assistance Sitting-balance support: Bilateral upper extremity supported;Feet unsupported Sitting balance-Leahy Scale: Poor Sitting balance - Comments: required modA decreasing to minA to maintain  balance for 3 min EoB before fatiguing                                     Cognition Arousal/Alertness: Awake/alert Behavior During Therapy: Restless;Agitated Overall Cognitive Status: Impaired/Different from baseline Area of Impairment: Orientation;Attention;Memory;Following commands;Awareness;Safety/judgement;Problem solving                 Orientation Level: Disoriented to;Situation Current Attention Level: Selective Memory: Decreased short-term memory Following Commands: Follows one step commands inconsistently;Follows one step commands with increased time Safety/Judgement: Decreased awareness of deficits Awareness: Intellectual Problem Solving: Slow processing;Decreased initiation;Difficulty sequencing;Requires verbal cues General Comments: Pt minimally able to participate this session. He did answer questions but limited by poor ability to plan movements.       Exercises      General Comments General comments (skin integrity, edema, etc.): Pt wife in room, encouraging pt to sit up for awhile      Pertinent Vitals/Pain Pain Assessment: Faces Faces Pain Scale: Hurts even more Pain Location:  R hip Pain Descriptors / Indicators: Discomfort;Grimacing;Guarding Pain Intervention(s): Limited activity within patient's tolerance;Monitored during session;Repositioned           PT Goals (current goals can now be found in the care plan section) Acute Rehab PT Goals Patient Stated Goal: none stated  PT Goal Formulation: With patient Time For Goal Achievement: 07/08/17 Potential to Achieve Goals: Fair Progress towards PT goals: Not progressing toward goals - comment(pt limited by cognition and activity tolerance)    Frequency  Min 2X/week      PT Plan Current plan remains appropriate       AM-PAC PT "6 Clicks" Daily Activity  Outcome Measure  Difficulty turning over in bed (including adjusting bedclothes, sheets and blankets)?:  Unable Difficulty moving from lying on back to sitting on the side of the bed? : Unable Difficulty sitting down on and standing up from a chair with arms (e.g., wheelchair, bedside commode, etc,.)?: Unable Help needed moving to and from a bed to chair (including a wheelchair)?: Total Help needed walking in hospital room?: Total Help needed climbing 3-5 steps with a railing? : Total 6 Click Score: 6    End of Session Equipment Utilized During Treatment: Gait belt Activity Tolerance: Patient limited by fatigue Patient left: in bed;with call bell/phone within reach;with family/visitor present;with bed alarm set;Other (comment) Nurse Communication: Mobility status PT Visit Diagnosis: Other abnormalities of gait and mobility (R26.89);Muscle weakness (generalized) (M62.81);Difficulty in walking, not elsewhere classified (R26.2);Other symptoms and signs involving the nervous system (R29.898);History of falling (Z91.81)     Time: [id number] PT Time Calculation (min) (ACUTE ONLY): 19 min  Charges:  $Therapeutic Activity: 8-22 mins                    G Codes:       Amari Zagal B. Beverely RisenVan Nixon PT, DPT Acute Rehabilitation  [phone] Pager [phone]     Elijah Nixon 06/26/2017, 5:12 PM

## 2017-06-26 NOTE — Progress Notes (Signed)
CSW received referral regarding SNF placement. CSW notes that patient was just released from SNF and will likely have a co-pay amount. CSW will discuss the cost with wife.   Elijah Cascoadia Karynn Deblasi LCSW [phone]

## 2017-06-26 NOTE — Clinical Social Work Note (Signed)
Clinical Social Work Assessment  Patient Details  Name: Elijah RasmussenBilly W Agramonte MRN: [id number] Date of Birth: [birth year]  Date of referral:  06/26/17               Reason for consult:  Facility Placement                Permission sought to share information with:  Facility Medical sales representativeContact Representative, Family Supports Permission granted to share information::  No  Name::     Retail buyerBrenda  Agency::  SNFs  Relationship::  Spouse  Contact Information:     Housing/Transportation Living arrangements for the past 2 months:  Single Family Home, Skilled Nursing Facility Source of Information:  Spouse Patient Interpreter Needed:  None Criminal Activity/Legal Involvement Pertinent to Current Situation/Hospitalization:  No - Comment as needed Significant Relationships:  Spouse Lives with:  Spouse Do you feel safe going back to the place where you live?  No Need for family participation in patient care:  Yes (Comment)  Care giving concerns:  CSW received consult for possible SNF placement at time of discharge. CSW spoke with patient's spouse regarding PT recommendation of SNF placement at time of discharge. Patient's spouse  reported that patient discharged [facility] in February. Patient's spouse reports understanding that patient is in his Medicare copay days. CSW to continue to follow and assist with discharge planning needs.   Social Worker assessment / plan:  CSW spoke with patient's spouse concerning [facility] before returning home.  Employment status:  Retired Database administratornsurance information:  Managed Medicare PT Recommendations:  Skilled Nursing Facility Information / Referral to community resources:  Skilled Nursing Facility  Patient/Family's Response to care:  Patient's spouse reports that she is unable to pay copays for patient to go to SNF. CSW recommended that she call patient's insurance company to see how much the copays would be, if any. She stated she would and would call me back  if anything changes. She reported not liking the care at [facility], but did like [facility]. She stated she believed they made too much money to qualify for Medicaid.   Patient/Family's Understanding of and Emotional Response to Diagnosis, Current Treatment, and Prognosis:  Patient/family is realistic regarding therapy needs and expressed being hopeful for SNF placement. Patient's spouse expressed understanding of CSW role and discharge process as well as medical condition. No questions/concerns about plan or treatment.    Emotional Assessment Appearance:  Appears older than stated age Attitude/Demeanor/Rapport:  Unable to Assess Affect (typically observed):  Unable to Assess Orientation:  Oriented to Self Alcohol / Substance use:  Alcohol Use Psych involvement (Current and /or in the community):  No (Comment)  Discharge Needs  Concerns to be addressed:  Care Coordination Readmission within the last 30 days:  Yes Current discharge risk:  Cognitively Impaired Barriers to Discharge:  Continued Medical Work up   [employer] S Tashunda Vandezande, LCSWA 06/26/2017, 5:08 PM

## 2017-06-26 NOTE — Progress Notes (Signed)
Pharmacy Antibiotic Note  Elijah Nixon is a 84 y.o. male admitted on 06/23/2017 with  pneumonia.  Pharmacy has been consulted for unasyn dosing. CXR with infiltrates suspicious for aspiration PNA. WBC 17.4  Plan: Unasyn 3G IV Q6 for 5 to 7 days Monitor clinical progression   Height: 5\' 7"  (170.2 cm) Weight: 156 lb (70.8 kg) IBW/kg (Calculated) : 66.1  Temp (24hrs), Avg:96.2 F (35.7 C), Min:95.2 F (35.1 C), Max:97.3 F (36.3 C)  Recent Labs  Lab 06/23/17 1010 06/23/17 1028 06/24/17 0623 06/25/17 0617 06/26/17 0514  WBC 12.1*  --  12.5* 13.7* 17.4*  CREATININE 0.84  --  0.88 0.93 0.90  LATICACIDVEN  --  0.81  --   --   --     Estimated Creatinine Clearance: 59.2 mL/min (by C-G formula based on SCr of 0.9 mg/dL).    Allergies  Allergen Reactions  . Lisinopril Other (See Comments)    REACTION: increased potassium leveles   Thank you for allowing pharmacy to be a part of this patient's care.  Toniann Failony L Glenna Brunkow 06/26/2017 2:05 PM

## 2017-06-26 NOTE — Progress Notes (Addendum)
PROGRESS NOTE    Elijah Nixon  YQM:[id number] DOB: [birth year] DOA: 06/23/2017 PCP: [name], [name], MD  Brief Narrative: Elijah Nixon is a 84 year old male with long-standing history of alcohol use -6pack of beer/day for 40-50years, hypertension, hip fracture in January/treated with intramedullary nail, this is when he quit drinking, hospitalization then complicated by alcohol withdrawal, then went to rehabilitation -Went home from rehabilitation 3 weeks ago and has been having hallucinations since then however much worse in the last 10 days. -In addition wife also reports problems with short-term memory and cognitive/functional decline for approximately 1 year. She reports that he had hallucinations while in rehabilitation as well however they were not this severe. -He has not had access to alcohol since 1/12 -Psychosis/agitation with mild improvement, 3/12 with worsening leukocytosis, repeat CXR today with possible infiltrate, will add Abx today -also has Hypothermia since admission, improving  Assessment & Plan:   1. 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 Worsening Leukocytosis -? Aspiration pneumonia -few ronchi and CXR today 3/12 with ? new Infiltrates, suspected he could have aspirated since admission -will start Empiric Unasyn for 5-7days, although procalcitonin is <0.1 -CXR on admission was normal -I still suspect his Hypothermia could be related to autonomic neuropathy from Chronic Alcoholism and may not improve with Abx -FU urine Cx, since he has foley now and urine more cloudy  3. ETOH abuse -drinking a 6pack of beer/day for 40-50years -stop drinking in January at admission with Hip Fracture -Continue Thiamine  4. HTN -BP soft but stable, stop amlodipine  5.  Chronic Grovers disease: Patient currently with rash  -on topical steroids for this, per wife his rash is stable and long standing -suspect this is causing some part of his chronic leukocytosis and mild eosinophilia  DVT prophylaxis:lovenox Code Status: DNR Family Communication: Wife at bedside Disposition Plan: Will need SNF  Consultants:   NEuro  Psych   Procedures:   Antimicrobials:    Subjective: -agitation better last night, slept some  Objective: Vitals:   06/26/17 0640 06/26/17 0800 06/26/17 0913 06/26/17 1140  BP:   (!) 107/49   Pulse:      Resp:   19   Temp:  (!) 96 F (35.6 C)  (!) 97.3 F (36.3 C)  TempSrc:  Axillary  Axillary  SpO2:      Weight: 70.8 kg (156 lb)     Height:        Intake/Output Summary (Last 24 hours) at 06/26/2017 1327 Last data filed at 06/26/2017 0300 Gross per 24 hour  Intake 1240 ml  Output 1300 ml  Net -60 ml   Filed Weights   06/23/17 1003 06/23/17 1713 06/26/17 0640  Weight: 65.8 kg (145 lb) 66.7 kg (147 lb 0.8 oz) 70.8 kg (156 lb)    Examination:  Gen: awake, less agitated, mumbles a few words, remains confused HEENT: PERRLA, Neck supple, no JVD Lungs: few scattered rhonchi at the bases CVS: RRR,No Gallops,Rubs or new Murmurs Abd: soft, Non tender, non distended, BS  present Extremities: no edema,  Small area of erythema over the left knee no evidence of effusion, warmth, tenderness Skin: diffuse maculopapular chronic skin rash Neurology: alert, less agitated, moves all extremities, no localizing signs    Data Reviewed:   CBC: Recent Labs  Lab 06/23/17 1010 06/24/17 0623 06/25/17 0617 06/26/17 0514  WBC 12.1* 12.5* 13.7* 17.4*  NEUTROABS 7.2  --   --  12.3*  HGB 11.0* 10.4* 10.2* 10.8*  HCT 30.4* 28.6* 28.5* 29.2*  MCV 95.6 97.6 98.6 97.7  PLT 418* 411* 387 400   Basic Metabolic Panel: Recent Labs  Lab 06/23/17 1010 06/24/17 0623 06/24/17 1706 06/25/17 0617 06/26/17 0514  NA 139 143  --  147* 149*  K 4.4 4.3  --  4.0 4.1  CL 111 116*  --  118* 119*  CO2 18* 17*  --  17* 19*  GLUCOSE 92 69  --  81 134*  BUN 17 12  --  12 9  CREATININE 0.84 0.88  --  0.93 0.90  CALCIUM 8.7* 8.2*  --  8.4* 8.6*  MG  --   --   --  2.0  --   PHOS  --   --  3.9  --   --    GFR: Estimated Creatinine Clearance: 59.2 mL/min (by C-G formula based on SCr of 0.9 mg/dL). Liver Function Tests: Recent Labs  Lab 06/23/17 1010 06/24/17 1706 06/26/17 0514  AST 46*  --  45*  ALT 54  --  52  ALKPHOS 157* 145* 148*  BILITOT 0.5  --  0.9  PROT 5.8*  --  5.6*  ALBUMIN 2.6*  --  2.3*   No results for input(s): LIPASE, AMYLASE in the last 168 hours. Recent Labs  Lab 06/23/17 1315  AMMONIA 18   Coagulation Profile: No results for input(s): INR, PROTIME in the last 168 hours. Cardiac Enzymes: No results for input(s): CKTOTAL, CKMB, CKMBINDEX, TROPONINI in the last 168 hours. BNP (last 3 results) No results for input(s): PROBNP in the last 8760 hours. HbA1C: No results for input(s): HGBA1C in the last 72 hours. CBG: No results for input(s): GLUCAP in the last 168 hours. Lipid Profile: No results for input(s): CHOL, HDL, LDLCALC, TRIG, CHOLHDL, LDLDIRECT in the last 72 hours. Thyroid Function Tests: Recent Labs    06/23/17 1518 06/24/17 0919    TSH 5.441*  --   FREET4  --  0.80   Anemia Panel: Recent Labs    06/24/17 0919  VITAMINB12 1,132*   Urine analysis:    Component Value Date/Time   COLORURINE GREEN (A) 06/25/2017 0303   APPEARANCEUR TURBID (A) 06/25/2017 0303   LABSPEC 1.015 06/25/2017 0303   PHURINE 5.0 06/25/2017 0303   GLUCOSEU NEGATIVE 06/25/2017 0303   HGBUR LARGE (A) 06/25/2017 0303   BILIRUBINUR NEGATIVE 06/25/2017 0303   BILIRUBINUR Negative 06/19/2017 1419   KETONESUR 5 (A) 06/25/2017 0303   PROTEINUR 100 (A) 06/25/2017 0303   UROBILINOGEN 0.2 06/19/2017 1419   NITRITE NEGATIVE 06/25/2017 0303   LEUKOCYTESUR NEGATIVE 06/25/2017 0303   Sepsis Labs: @LABRCNTIP (procalcitonin:4,lacticidven:4)  ) Recent Results (from the past 240 hour(s))  Urine Culture     Status: None   Collection Time: 06/19/17  2:32 PM  Result Value Ref Range Status  MICRO NUMBER: [id number]  Final   SPECIMEN QUALITY: ADEQUATE  Final   Sample Source URINE  Final   STATUS: FINAL  Final   Result: No Growth  Final  MRSA PCR Screening     Status: None   Collection Time: 06/23/17  8:32 PM  Result Value Ref Range Status   MRSA by PCR NEGATIVE NEGATIVE Final    Comment:        The GeneXpert MRSA Assay (FDA approved for NASAL specimens only), is one component of a comprehensive MRSA colonization surveillance program. It is not intended to diagnose MRSA infection nor to guide or monitor treatment for MRSA infections. Performed at [facility], [address], [place], [place] [address]          Radiology Studies: Dg Chest Port 1 View  Result Date: 06/26/2017 CLINICAL DATA:  Leukocytosis. EXAM: PORTABLE CHEST 1 VIEW COMPARISON:  06/23/2017. FINDINGS: Stable cardiomegaly. New onset of bibasilar pulmonary infiltrates/edema. No prominent pleural effusion or pneumothorax. Degenerative change thoracic spine. IMPRESSION: 1.  New onset of bibasilar pulmonary infiltrates/edema. 2.  Stable cardiomegaly. Electronically  Signed   By: Maisie Fus  Register   On: 06/26/2017 09:41        Scheduled Meds: . aspirin EC  81 mg Oral Daily  . B-complex with vitamin C  1 tablet Oral Daily  . calcium-vitamin D  1 tablet Oral Q breakfast  . doxazosin  8 mg Oral Daily  . enoxaparin (LOVENOX) injection  40 mg Subcutaneous Q24H  . folic acid  1 mg Oral Daily  . multivitamin  1 tablet Oral Daily  . [START ON 06/27/2017] potassium chloride SA  40 mEq Oral Daily  . risperiDONE  0.5 mg Oral BID  . sodium chloride flush  3 mL Intravenous Q12H  . traZODone  150 mg Oral QPM   Continuous Infusions: . dextrose 75 mL/hr at 06/26/17 0853  . thiamine injection Stopped (06/26/17 0930)     LOS: 0 days    Time spent:    Zannie Cove, MD [facility] Page via [email or web address], password [email or web address] After 7PM please contact night-coverage  06/26/2017, 1:27 PM

## 2017-06-27 DIAGNOSIS — E870 Hyperosmolality and hypernatremia: Secondary | ICD-10-CM

## 2017-06-27 DIAGNOSIS — J690 Pneumonitis due to inhalation of food and vomit: Secondary | ICD-10-CM

## 2017-06-27 DIAGNOSIS — E43 Unspecified severe protein-calorie malnutrition: Secondary | ICD-10-CM

## 2017-06-27 LAB — PROCALCITONIN: PROCALCITONIN: 0.11 ng/mL

## 2017-06-27 LAB — URINE CULTURE: CULTURE: NO GROWTH

## 2017-06-27 LAB — CBC
HCT: 32.8 % — ABNORMAL LOW (ref 39.0–52.0)
Hemoglobin: 10.5 g/dL — ABNORMAL LOW (ref 13.0–17.0)
MCH: 30.3 pg (ref 26.0–34.0)
MCHC: 32 g/dL (ref 30.0–36.0)
MCV: 94.5 fL (ref 78.0–100.0)
PLATELETS: 351 10*3/uL (ref 150–400)
RBC: 3.47 MIL/uL — ABNORMAL LOW (ref 4.22–5.81)
RDW: 16.5 % — AB (ref 11.5–15.5)
WBC: 17.4 10*3/uL — AB (ref 4.0–10.5)

## 2017-06-27 LAB — BASIC METABOLIC PANEL
ANION GAP: 11 (ref 5–15)
BUN: 10 mg/dL (ref 6–20)
CALCIUM: 8.4 mg/dL — AB (ref 8.9–10.3)
CO2: 18 mmol/L — ABNORMAL LOW (ref 22–32)
CREATININE: 1.11 mg/dL (ref 0.61–1.24)
Chloride: 124 mmol/L — ABNORMAL HIGH (ref 101–111)
GFR calc Af Amer: >60 mL/min (ref >60)
GFR, EST NON AFRICAN AMERICAN: 60 mL/min — AB (ref >60)
Glucose, Bld: 77 mg/dL (ref 65–99)
Potassium: 4.5 mmol/L (ref 3.5–5.1)
Sodium: 153 mmol/L — ABNORMAL HIGH (ref 135–145)

## 2017-06-27 LAB — GLUCOSE, CAPILLARY: Glucose-Capillary: 77 mg/dL (ref 65–99)

## 2017-06-27 LAB — VITAMIN B1: Vitamin B1 (Thiamine): 310.9 nmol/L — ABNORMAL HIGH (ref 66.5–200.0)

## 2017-06-27 MED ORDER — FREE WATER: 250.0000 mL | Freq: Four times a day (QID) | Status: DC

## 2017-06-27 MED ORDER — LORAZEPAM 2 MG/ML IJ SOLN
1.0000 mg | Freq: Once | INTRAMUSCULAR | Status: AC
  Administered 2017-06-27: 1 mg via INTRAVENOUS
  Filled 2017-06-27: qty 1

## 2017-06-27 MED ORDER — LORAZEPAM 2 MG/ML IJ SOLN
0.5000 mg | Freq: Once | INTRAMUSCULAR | Status: AC
  Administered 2017-06-27: 0.5 mg via INTRAVENOUS
  Filled 2017-06-27: qty 1

## 2017-06-27 MED ORDER — HYDROCORTISONE 1 % EX CREA
TOPICAL_CREAM | Freq: Four times a day (QID) | CUTANEOUS | Status: DC | PRN
  Administered 2017-06-27: 23:00:00 via TOPICAL
  Administered 2017-06-28: 1 via TOPICAL
  Filled 2017-06-27 (×2): qty 28

## 2017-06-27 MED ORDER — LORAZEPAM 2 MG/ML IJ SOLN
1.0000 mg | Freq: Four times a day (QID) | INTRAMUSCULAR | Status: DC | PRN
  Administered 2017-06-27 (×2): 1 mg via INTRAVENOUS
  Filled 2017-06-27 (×2): qty 1

## 2017-06-27 NOTE — Progress Notes (Addendum)
PROGRESS NOTE                                                                                                                                                                                                             Patient Demographics:    Elijah Nixon, is a 84 y.o. male, DOB - 13-Mar-1933, BJY:[id number]  Admit date - 06/23/2017   Admitting Physician Lahoma Crocker, MD  Outpatient Primary MD for the patient is Tower, Audrie Gallus, MD  LOS - 1  Outpatient Specialists: none  Chief Complaint  Patient presents with  . Fall       Brief Narrative   84 year old male with long-standing alcohol use (over 6 pack beer daily for almost 40-50 years), hypertension, hip fracture in January which was managed with IM nail after which he has quit drinking.  His hospitalization at that time was complicated by alcohol withdrawal which was stable and was discharged to rehab.  Patient returned home from rehabilitation 3 weeks back and has been having hallucinations since then worsened since 10 days prior to admission.  He also had a fall at home 4-5 days b prior to admission and was seen by PCP.  Patient had not sustained any injury.  Patient admitted for further management with hospital course complicated due to ongoing psychosis and encephalopathy along with aspiration pneumonia.    Subjective:   Patient noted to be having jerking movements by nursing staff since yesterday and remains Encephalopathic, and noncommunicative.  Assessment  & Plan :   Principal problem Acute metabolic encephalopathy Associated with psychosis, delirium and hallucinations.  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 Neurology consult appreciated.  MRI brain done with s severely motion degraded.  Recommend to repeat once able to cooperate.  Currently on high-dose IV thiamine.   Avoiding antipsychotics as much as possible. Neurology suggest a Warnicke/Korsakoff type syndrome is also possible.  EEG negative for seizure but showed diffuse slowing.  Normal a.m. cortisol and nonreactive RPR.  Thyroid function suggestive of sick euthyroid.  B12 normal.  Thiamine level is high.  (311) Patient seen by psychiatry and started on Risperdal as per recommendations.  Given his question mild jerking movements and restlessness I have discontinued it today and monitor. Symptoms appear to be worsening today.  Patient  appears to be moving in bed with restlessness rather than jerky movements suggestive of seizures.  Recent EEG is reassuring.  Worsened encephalopathy likely due to ongoing aspiration.  Risperdal discontinued and if no improvement in the next 24 hours will need to discuss with neurology and  have goals of care discussion with palliative care.  Wife agrees with plan.  Patient has completed 3 days of high-dose IV thiamine. Remains n.p.o.  Continue mittens and neurochecks.   Aspiration pneumonia Has coarse crackles on exam with worsened leukocytosis.  Chest x-ray from 3/12 with new infiltrates.  On empiric Unasyn.  Keep n.p.o. Risk of ongoing aspiration.  UA negative for infection.  Hypernatremia Secondary to dehydration.  Continue D5W.  Alcohol abuse As above.  Has 40-50 years of heavy alcohol use (6 packs beer per day).  Stopped drinking once back after hospitalized for hip fracture.  Continue IV thiamine for now.  Essential hypertension Blood pressure soft.  Not on any medications.  Chronic Grovers disease. Topical steroids.  Rash stable and long-standing..  Severe protein calorie malnutrition Currently n.p.o.  Code Status : DNR  Family Communication  : Wife at bedside  Disposition Plan  : Pending hospital course.  Overall prognosis is guarded and wife understands and agrees with palliative care consult for goals of care if no improvement in the next 24-48  hours.  Barriers For Discharge : Active symptoms  Consults  : Neurology  Procedures  : MRI brain, EEG  DVT Prophylaxis  :  Lovenox -   Lab Results  Component Value Date   PLT 351 06/27/2017    Antibiotics  :  Anti-infectives (From admission, onward)   Start     Dose/Rate Route Frequency Ordered Stop   06/26/17 1400  Ampicillin-Sulbactam (UNASYN) 3 g in sodium chloride 0.9 % 100 mL IVPB     3 g 200 mL/hr over 30 Minutes Intravenous Every 6 hours 06/26/17 1347          Objective:   Vitals:   06/27/17 0623 06/27/17 0640 06/27/17 0647 06/27/17 1604  BP: (!) 141/122 (!) 71/28 (!) 104/50 119/66  Pulse: 97 100  80  Resp: 19   20  Temp: 98 F (36.7 C)   98.1 F (36.7 C)  TempSrc: Oral   Axillary  SpO2:    95%  Weight:      Height:        Wt Readings from Last 3 Encounters:  06/26/17 70.8 kg (156 lb)  06/18/17 65.9 kg (145 lb 4 oz)  06/10/17 64.4 kg (141 lb 15.6 oz)     Intake/Output Summary (Last 24 hours) at 06/27/2017 1718 Last data filed at 06/27/2017 1518 Gross per 24 hour  Intake 200 ml  Output 400 ml  Net -200 ml     Physical Exam  Gen: Restless in bed, non-verbal and noncommunicative, HEENT: Pupils reactive bilaterally, not following commands, dry mucosa, supple neck Chest: Coarse bilateral crackles, no rhonchi or wheeze CVS: N S1&S2, no murmurs, rubs or gallop GI: soft, NT, ND,  Musculoskeletal: warm, no edema CNS: AAOX0, restless in bed, does not follow command and is noncommunicative.    Data Review:    CBC Recent Labs  Lab 06/23/17 1010 06/24/17 0623 06/25/17 0617 06/26/17 0514 06/27/17 0457  WBC 12.1* 12.5* 13.7* 17.4* 17.4*  HGB 11.0* 10.4* 10.2* 10.8* 10.5*  HCT 30.4* 28.6* 28.5* 29.2* 32.8*  PLT 418* 411* 387 400 351  MCV 95.6 97.6 98.6 97.7 94.5  MCH 34.6* 35.5* 35.3*  36.1* 30.3  MCHC 36.2* 36.4* 35.8 37.0* 32.0  RDW 15.6* 16.6* 16.4* 16.6* 16.5*  LYMPHSABS 1.3  --   --  1.6  --   MONOABS 0.5  --   --  0.7  --   EOSABS  3.1*  --   --  2.7*  --   BASOSABS 0.0  --   --  0.0  --     Chemistries  Recent Labs  Lab 06/23/17 1010 06/24/17 0623 06/24/17 1706 06/25/17 0617 06/26/17 0514 06/27/17 0457  NA 139 143  --  147* 149* 153*  K 4.4 4.3  --  4.0 4.1 4.5  CL 111 116*  --  118* 119* 124*  CO2 18* 17*  --  17* 19* 18*  GLUCOSE 92 69  --  81 134* 77  BUN 17 12  --  12 9 10   CREATININE 0.84 0.88  --  0.93 0.90 1.11  CALCIUM 8.7* 8.2*  --  8.4* 8.6* 8.4*  MG  --   --   --  2.0  --   --   AST 46*  --   --   --  45*  --   ALT 54  --   --   --  52  --   ALKPHOS 157*  --  145*  --  148*  --   BILITOT 0.5  --   --   --  0.9  --    ------------------------------------------------------------------------------------------------------------------ No results for input(s): CHOL, HDL, LDLCALC, TRIG, CHOLHDL, LDLDIRECT in the last 72 hours.  Lab Results  Component Value Date   HGBA1C 5.8 01/16/2017   ------------------------------------------------------------------------------------------------------------------ No results for input(s): TSH, T4TOTAL, T3FREE, THYROIDAB in the last 72 hours.  Invalid input(s): FREET3 ------------------------------------------------------------------------------------------------------------------ No results for input(s): VITAMINB12, FOLATE, FERRITIN, TIBC, IRON, RETICCTPCT in the last 72 hours.  Coagulation profile No results for input(s): INR, PROTIME in the last 168 hours.  No results for input(s): DDIMER in the last 72 hours.  Cardiac Enzymes No results for input(s): CKMB, TROPONINI, MYOGLOBIN in the last 168 hours.  Invalid input(s): CK ------------------------------------------------------------------------------------------------------------------    Component Value Date/Time   BNP 58.5 06/23/2017 1010    Inpatient Medications  Scheduled Meds: . aspirin EC  81 mg Oral Daily  . B-complex with vitamin C  1 tablet Oral Daily  . calcium-vitamin D  1 tablet  Oral Q breakfast  . doxazosin  8 mg Oral Daily  . enoxaparin (LOVENOX) injection  40 mg Subcutaneous Q24H  . folic acid  1 mg Oral Daily  . free water  250 mL Oral Q6H  . multivitamin  1 tablet Oral Daily  . potassium chloride SA  40 mEq Oral Daily  . sodium chloride flush  3 mL Intravenous Q12H  . traZODone  150 mg Oral QPM   Continuous Infusions: . ampicillin-sulbactam (UNASYN) IV Stopped (06/27/17 1410)  . dextrose 75 mL/hr at 06/27/17 0545  . thiamine injection Stopped (06/27/17 1548)   PRN Meds:.acetaminophen **OR** acetaminophen, LORazepam, ondansetron **OR** ondansetron (ZOFRAN) IV, polyethylene glycol  Micro Results Recent Results (from the past 240 hour(s))  Urine Culture     Status: None   Collection Time: 06/19/17  2:32 PM  Result Value Ref Range Status   MICRO NUMBER: [id number]  Final   SPECIMEN QUALITY: ADEQUATE  Final   Sample Source URINE  Final   STATUS: FINAL  Final   Result: No Growth  Final  MRSA PCR Screening     Status: None  Collection Time: 06/23/17  8:32 PM  Result Value Ref Range Status   MRSA by PCR NEGATIVE NEGATIVE Final    Comment:        The GeneXpert MRSA Assay (FDA approved for NASAL specimens only), is one component of a comprehensive MRSA colonization surveillance program. It is not intended to diagnose MRSA infection nor to guide or monitor treatment for MRSA infections. Performed at [facility], [address] [place], [place] [address]   Culture, Urine     Status: None   Collection Time: 06/26/17  2:30 PM  Result Value Ref Range Status   Specimen Description URINE, CATHETERIZED  Final   Special Requests NONE  Final   Culture   Final    NO GROWTH Performed at [facility], [address], [place], [place] [address]    Report Status 06/27/2017 FINAL  Final    Radiology Reports Dg Chest 2 View  Result Date: 06/23/2017 CLINICAL DATA:  Hallucinations.  Right hip pain. EXAM: CHEST - 2 VIEW COMPARISON:  Chest  radiograph 06/09/2017 and CTA 06/10/2017 FINDINGS: Telemetry leads overlie the chest. The cardiac silhouette is upper limits of normal in size. The lungs are mildly hyperinflated. Blunting of the costophrenic angles may reflect tiny residual pleural effusions. No confluent airspace opacity, edema, or pneumothorax is identified. Old right rib fractures are noted. IMPRESSION: Hyperinflation and possible tiny bilateral pleural effusions. No evidence of acute airspace disease. Electronically Signed   By: Sebastian Ache M.D.   On: 06/23/2017 11:42   Ct Head Wo Contrast  Result Date: 06/23/2017 CLINICAL DATA:  Patient fell 2 days ago. Altered mental status. Generalized weakness. EXAM: CT HEAD WITHOUT CONTRAST TECHNIQUE: Contiguous axial images were obtained from the base of the skull through the vertex without intravenous contrast. COMPARISON:  None. FINDINGS: Brain: Atrophy with sulcal prominence and centralized volume loss, left slightly greater than right. Scattered periventricular hypodensities. Old lacunar infarct within the left insular cortex (image 20, series 4). Bilateral basal ganglial calcifications. No intraparenchymal or extra-axial mass or hemorrhage. Normal configuration of the ventricles and the basilar cisterns. No midline shift. Vascular: Intracranial atherosclerosis. Skull: No displaced calvarial fracture. Sinuses/Orbits: There is minimal polypoid mucosal thickening involving the anterior aspect of the left maxillary sinus. The remaining paranasal sinuses and mastoid air cells are normally aerated. No air-fluid levels. Post bilateral cataract surgery. Other: Regional soft tissues appear normal. IMPRESSION: Atrophy and microvascular ischemic disease without superimposed acute intracranial process. Electronically Signed   By: Simonne Come M.D.   On: 06/23/2017 10:54   Ct Angio Chest Pe W Or Wo Contrast  Result Date: 06/10/2017 CLINICAL DATA:  84 year old male with concern for PE. EXAM: CT ANGIOGRAPHY  CHEST WITH CONTRAST TECHNIQUE: Multidetector CT imaging of the chest was performed using the standard protocol during bolus administration of intravenous contrast. Multiplanar CT image reconstructions and MIPs were obtained to evaluate the vascular anatomy. CONTRAST:  ISOVUE-370 IOPAMIDOL (ISOVUE-370) INJECTION 76% COMPARISON:  Chest radiograph dated 06/09/2017 FINDINGS: Cardiovascular: There is mild cardiomegaly. No pericardial effusion. Multi vessel coronary vascular calcification. Mild atherosclerotic calcification of the thoracic aorta. The origins of the great vessels of the aortic arch are patent. No CT evidence of pulmonary embolism Mediastinum/Nodes: No hilar or mediastinal adenopathy. Esophagus and the thyroid gland are grossly unremarkable. No mediastinal fluid collection. Lungs/Pleura: Small bilateral pleural effusions. Bibasilar linear atelectasis/scarring. There is no focal consolidation, or pneumothorax. The central airways are patent. Upper Abdomen: No acute abnormality. Musculoskeletal: Degenerative changes of  the spine. No acute osseous pathology. Review of the MIP images confirms the above findings. IMPRESSION: 1. No CT evidence of pulmonary embolism. 2. Small bilateral pleural effusions. 3. Mild cardiomegaly and coronary vascular calcification. Electronically Signed   By: Elgie CollardArash  Radparvar M.D.   On: 06/10/2017 05:27   Mr Brain Wo Contrast  Result Date: 06/24/2017 CLINICAL DATA:  84 y/o M; altered level of consciousness unexplained. Lower extremity weakness and hallucinations. EXAM: MRI HEAD WITHOUT CONTRAST TECHNIQUE: Extensive patient motion. Axial DWI, coronal DWI, axial T2 FLAIR sequences were acquired. Patient was unable to continue. COMPARISON:  06/23/2017 CT head. FINDINGS: Severely motion degraded DWI and T2 FLAIR weighted sequences. No gross diffusion signal abnormality or focal mass effect. Brain parenchymal volume loss as seen on prior CT. IMPRESSION: Severely motion degraded  DWI and T2 FLAIR sequences. Patient was unable to continue. Repeat imaging recommended when patient is able to follow commands and hold still. Electronically Signed   By: Mitzi HansenLance  Furusawa-Stratton M.D.   On: 06/24/2017 01:05   Ct Hip Right Wo Contrast  Result Date: 06/09/2017 CLINICAL DATA:  Fall, prior hip surgery EXAM: CT OF THE RIGHT HIP WITHOUT CONTRAST TECHNIQUE: Multidetector CT imaging of the right hip was performed according to the standard protocol. Multiplanar CT image reconstructions were also generated. COMPARISON:  Radiographs 06/09/2017, 04/28/2017 FINDINGS: Bones/Joint/Cartilage Patient is status post intramedullary rodding of the proximal right femur across comminuted intertrochanteric fracture. Displaced lesser trochanteric fracture fragments with developing callus. Multiple fracture fragments at the cephalad aspect of the vertical femoral rod. Multiple displaced cortical bone fragments at the proximal shaft of the femur posteriorly. Obliquely oriented cortical bone fragment anteriorly at the base of the femoral neck. The rod traversing the femoral head and neck penetrates the lateral cortex of the femur and protrudes into the posterolateral soft tissues, this appears increased compared to radiograph 04/28/2016. Slight increased shortening of the femoral neck since the postoperative radiograph. No discrete lucencies around the femoral rod. No fracture through the hardware. Pubic symphysis and rami are intact. Ligaments Suboptimally assessed by CT. Muscles and Tendons No intramuscular hematoma. Soft tissues Soft tissue thickening and minimal fluid lateral to the trochanter, may reflect resolving postoperative fluid or hematoma. Skin thickening and subcutaneous edema are noted. IMPRESSION: 1. Status post intramedullary rodding of comminuted intertrochanteric fracture with displaced lesser trochanteric fracture fragment. Callus is forming at the fracture site. There appears to be slightly more  lateral positioning of the rod traversing the femoral head and neck with the lateral aspect protruding into the soft tissues, compared to prior radiographs. There appears to be some increased foreshortening/impaction at the femoral neck compared to the postoperative radiograph. Surgical hardware otherwise appears intact and without surrounding lucency. Electronically Signed   By: Jasmine PangKim  Fujinaga M.D.   On: 06/09/2017 22:36   Dg Chest Port 1 View  Result Date: 06/26/2017 CLINICAL DATA:  Leukocytosis. EXAM: PORTABLE CHEST 1 VIEW COMPARISON:  06/23/2017. FINDINGS: Stable cardiomegaly. New onset of bibasilar pulmonary infiltrates/edema. No prominent pleural effusion or pneumothorax. Degenerative change thoracic spine. IMPRESSION: 1.  New onset of bibasilar pulmonary infiltrates/edema. 2.  Stable cardiomegaly. Electronically Signed   By: Maisie Fushomas  Register   On: 06/26/2017 09:41   Dg Chest Port 1 View  Result Date: 06/09/2017 CLINICAL DATA:  Fall. EXAM: PORTABLE CHEST 1 VIEW COMPARISON:  04/28/2017 and prior radiographs FINDINGS: The cardiomediastinal silhouette is unremarkable. There is no evidence of focal airspace disease, pulmonary edema, suspicious pulmonary nodule/mass, pleural effusion, or pneumothorax. No acute bony abnormalities are  identified. IMPRESSION: No active disease. Electronically Signed   By: Harmon Pier M.D.   On: 06/09/2017 19:32   Dg Hip Unilat With Pelvis 2-3 Views Left  Result Date: 06/23/2017 CLINICAL DATA:  Right hand pain. Recent intramedullary nail placement in January 2019. EXAM: DG HIP (WITH OR WITHOUT PELVIS) 2-3V LEFT COMPARISON:  Right hip x-rays dated June 09, 2017. FINDINGS: Prior cephalomedullary nail fixation of the comminuted intertrochanteric fracture with displaced lesser trochanteric fragment. Worsened coxa vara deformity with the femoral nail now protruding through the femoral head surface into the hip joint. Unchanged fracture through the distal transfixing screw. No  new acute fracture. The pubic symphysis and sacroiliac joints are intact. Left hip joint space is preserved. Diffuse osteopenia. IMPRESSION: 1. 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 2. Unchanged fracture of the distal transfixing screw. Electronically Signed   By: Obie Dredge M.D.   On: 06/23/2017 11:49   Dg Hip Unilat W Or Wo Pelvis 2-3 Views Right  Result Date: 06/09/2017 CLINICAL DATA:  Acute LEFT hip pain following fall today. Initial encounter. EXAM: DG HIP (WITH OR WITHOUT PELVIS) 2-3V RIGHT COMPARISON:  04/28/2017 radiographs. FINDINGS: Intramedullary rod and screws within the RIGHT femur noted. The distal transfixing screw within the distal femur is fractured-age indeterminate. No acute fracture, subluxation or dislocation identified. Remote proximal RIGHT femur fracture with displaced fragment and heterotopic ossification noted. IMPRESSION: 1. Fracture of the distal transfixing screw of the intramedullary rod within the distal femur-age indeterminate. 2. Remote fracture of the proximal RIGHT femur without definite acute abnormality. Electronically Signed   By: Harmon Pier M.D.   On: 06/09/2017 19:32    Time Spent in minutes  35   Elijah Nixon M.D on 06/27/2017 at 5:18 PM  Between 7am to 7pm - Pager - [phone]  After 7pm go to [email or web address] - password [email or web address]  [facility] -  Office  [phone]

## 2017-06-27 NOTE — Progress Notes (Signed)
Paged MD. Notified of Pt agitation. Unable to sleep and pulling and IV and foley tubes.

## 2017-06-27 NOTE — Progress Notes (Signed)
Ativan ordered and given to Pt.  Continues to be mildly agitated.  Jerking and tremors improved. Glucose 77. Oxygen 100% on RA.  HR 94, RR- 21.  Unable to obtain accurate BP due to Pt movement. Will cont to monitor and treat per orders.

## 2017-06-27 NOTE — Progress Notes (Signed)
Pt continues to have  generalized jerking movements and tremors. Rapid response in to assess Pt. On-call provider paged again for new orders.  Pt O2 100 % on RA.

## 2017-06-27 NOTE — Progress Notes (Signed)
Another attempt to do EKG, unsuccessfully. Wife at the bedside. Will continue to monitor.

## 2017-06-27 NOTE — Progress Notes (Signed)
Staff trying to do EKG. Patient is restless and agitated even after the PRN dose of Ativan. MD informed. Will continue to monitor.

## 2017-06-27 NOTE — Progress Notes (Signed)
Notified on-call provider via text page of increased jerking/tremors. Possible seizure related activity? Will await orders.

## 2017-06-27 NOTE — Progress Notes (Signed)
  Speech Language Pathology Treatment: Dysphagia  Patient Details Name: Elijah RasmussenBilly W Nixon MRN: [id number] DOB: 1932/10/25 Today's Date: 06/27/2017 Time: 0810-0820 SLP Time Calculation (min) (ACUTE ONLY): 10 min  Assessment / Plan / Recommendation Clinical Impression  Skilled treatment session focused on dysphagia goals. SLP received pt in bed with bilateral mittens in place and pt was alert but not responsive to SLP. Pt with tremors and declined cognitive status over previous date. Pt unable to locate/look at SLP. Total A provided for oral care with pt continually [facility]. Pt with no visual swallow. Given that pt's dysphagia is cognitively based and pt is experiencing period of cognitive decline, I am recommending NPO until pt can demonstrate cognitive improvement for PO readiness. Nursing in support. ST to follow for PO readiness.    HPI HPI: 84 y.o. male with medical history significant recent alcohol cessation, hypertension, IM nail on 04/28/2017, who presents the emergency department complaining of hallucinations. 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       SLP Plan  Continue with current plan of care       Recommendations  Diet recommendations: NPO Medication Administration: Via alternative means                Oral Care Recommendations: Oral care QID Follow up Recommendations: Skilled Nursing facility SLP Visit Diagnosis: Dysphagia, oropharyngeal phase (R13.12) Plan: Continue with current plan of care       GO                Maicy Filip 06/27/2017, 8:52 AM

## 2017-06-28 DIAGNOSIS — R627 Adult failure to thrive: Secondary | ICD-10-CM

## 2017-06-28 DIAGNOSIS — Z66 Do not resuscitate: Secondary | ICD-10-CM

## 2017-06-28 DIAGNOSIS — E870 Hyperosmolality and hypernatremia: Secondary | ICD-10-CM

## 2017-06-28 DIAGNOSIS — Z515 Encounter for palliative care: Secondary | ICD-10-CM

## 2017-06-28 LAB — CBC
HCT: 31.9 % — ABNORMAL LOW (ref 39.0–52.0)
Hemoglobin: 10.4 g/dL — ABNORMAL LOW (ref 13.0–17.0)
MCH: 30.9 pg (ref 26.0–34.0)
MCHC: 32.6 g/dL (ref 30.0–36.0)
MCV: 94.7 fL (ref 78.0–100.0)
PLATELETS: 284 10*3/uL (ref 150–400)
RBC: 3.37 MIL/uL — AB (ref 4.22–5.81)
RDW: 16.2 % — AB (ref 11.5–15.5)
WBC: 18.7 10*3/uL — ABNORMAL HIGH (ref 4.0–10.5)

## 2017-06-28 LAB — COPPER, SERUM: Copper: 175 ug/dL — ABNORMAL HIGH (ref 72–166)

## 2017-06-28 LAB — BASIC METABOLIC PANEL
Anion gap: 10 (ref 5–15)
BUN: 13 mg/dL (ref 6–20)
CALCIUM: 7.9 mg/dL — AB (ref 8.9–10.3)
CO2: 20 mmol/L — ABNORMAL LOW (ref 22–32)
CREATININE: 1.19 mg/dL (ref 0.61–1.24)
Chloride: 123 mmol/L — ABNORMAL HIGH (ref 101–111)
GFR calc Af Amer: >60 mL/min (ref >60)
GFR, EST NON AFRICAN AMERICAN: 55 mL/min — AB (ref >60)
GLUCOSE: 132 mg/dL — AB (ref 65–99)
Potassium: 3.1 mmol/L — ABNORMAL LOW (ref 3.5–5.1)
SODIUM: 153 mmol/L — AB (ref 135–145)

## 2017-06-28 LAB — CALCIUM, IONIZED: CALCIUM, IONIZED, SERUM: 5.1 mg/dL (ref 4.5–5.6)

## 2017-06-28 LAB — VITAMIN B1: VITAMIN B1 (THIAMINE): 352.1 nmol/L — AB (ref 66.5–200.0)

## 2017-06-28 MED ORDER — POTASSIUM CHLORIDE 10 MEQ/100ML IV SOLN
10.0000 meq | INTRAVENOUS | Status: AC
  Administered 2017-06-28 (×3): 10 meq via INTRAVENOUS
  Filled 2017-06-28 (×3): qty 100

## 2017-06-28 MED ORDER — MORPHINE SULFATE (PF) 2 MG/ML IV SOLN
1.0000 mg | INTRAVENOUS | Status: DC | PRN
  Administered 2017-06-28: 1 mg via INTRAVENOUS
  Filled 2017-06-28: qty 1

## 2017-06-28 MED ORDER — ORAL CARE MOUTH RINSE
15.0000 mL | Freq: Two times a day (BID) | OROMUCOSAL | Status: DC
  Administered 2017-06-28 – 2017-06-29 (×3): 15 mL via OROMUCOSAL

## 2017-06-28 MED ORDER — CHLORHEXIDINE GLUCONATE 0.12 % MT SOLN
15.0000 mL | Freq: Two times a day (BID) | OROMUCOSAL | Status: DC
  Administered 2017-06-28 – 2017-06-29 (×3): 15 mL via OROMUCOSAL
  Filled 2017-06-28 (×3): qty 15

## 2017-06-28 MED ORDER — MORPHINE SULFATE (PF) 2 MG/ML IV SOLN
1.0000 mg | INTRAVENOUS | Status: DC | PRN
  Administered 2017-06-29: 1 mg via INTRAVENOUS
  Filled 2017-06-28: qty 1

## 2017-06-28 MED ORDER — GLYCOPYRROLATE 0.2 MG/ML IJ SOLN: 0.2000 mg | INTRAMUSCULAR | Status: DC | PRN

## 2017-06-28 NOTE — Progress Notes (Signed)
Palliative Medicine consult noted. Due to high referral volume, there may be a delay seeing this patient. Please call the Palliative Medicine Team office at [phone] if recommendations are needed in the interim.  Thank you for inviting us to see this patient.  Margret ChanceMelanie G. Zania Kalisz, RN, BSN, [facility] Palliative Medicine Team 06/28/2017 9:48 AM Office [phone]

## 2017-06-28 NOTE — Progress Notes (Signed)
Occupational Therapy Treatment and Discharge Patient Details Name: Elijah RasmussenBilly W Fretz MRN: [id number] DOB: 10-14-1932 Today's Date: 06/28/2017    History of present illness 84 y.o. male with medical history significant recent alcohol cessation, hypertension, IM nail on 04/28/2017, who presents the emergency department complaining of hallucinations. 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    OT comments  Pt unable to participate. Wife reports he is not improving and not expected to improve. Offered wife comfort and positioned pt. Discharged from OT services.  Follow Up Recommendations  No OT follow up    Equipment Recommendations       Recommendations for Other Services      Precautions / Restrictions Precautions Precautions: Fall       Mobility Bed Mobility Overal bed mobility: Needs Assistance Bed Mobility: Rolling Rolling: Total assist         General bed mobility comments: pt with no attempt to assist  Transfers                 General transfer comment: unsafe to attempt    Balance                                           ADL either performed or assessed with clinical judgement   ADL                                         General ADL Comments: total assist, performed B UE ROM and positioning     Vision       Perception     Praxis      Cognition Arousal/Alertness: Lethargic Behavior During Therapy: Restless Overall Cognitive Status: Impaired/Different from baseline                                 General Comments: largely non responsive        Exercises     Shoulder Instructions       General Comments      Pertinent Vitals/ Pain       Pain Assessment: Faces Faces Pain Scale: No hurt  Home Living                                          Prior Functioning/Environment              Frequency            Progress Toward Goals  OT Goals(current goals can now be found in the care plan section)  Progress towards OT goals: Not progressing toward goals - comment(pt with poor prognosis per wife)  Acute Rehab OT Goals Patient Stated Goal: none stated   Plan Other (comment)(d/c due to lack of progress, poor prognosis)    Co-evaluation                 AM-PAC PT "6 Clicks" Daily Activity     Outcome Measure   Help from another person eating meals?: Total Help from another person taking care of personal grooming?: Total Help from another person toileting, which includes using toliet, bedpan, or  urinal?: Total Help from another person bathing (including washing, rinsing, drying)?: Total Help from another person to put on and taking off regular upper body clothing?: Total Help from another person to put on and taking off regular lower body clothing?: Total 6 Click Score: 6    End of Session    OT Visit Diagnosis: Other symptoms and signs involving cognitive function;Repeated falls (R29.6)   Activity Tolerance Patient limited by lethargy   Patient Left in bed;with call bell/phone within reach;with bed alarm set;with family/visitor present   Nurse Communication (ok to leave mitts off)        Time: 1610-9604 OT Time Calculation (min): 19 min  Charges: OT General Charges $OT Visit: 1 Visit OT Treatments $Therapeutic Activity: 8-22 mins  06/28/2017 Martie Round, OTR/L Pager: [phone]   Iran Planas [place] Bailiff 06/28/2017, 1:45 PM

## 2017-06-28 NOTE — Progress Notes (Signed)
Patient's Sat O2 on RA 87%. Started oxygen via [place] 2L/min for comfort. Will continue to monitor. MD made aware.

## 2017-06-28 NOTE — Progress Notes (Signed)
SLP Cancellation Note  Patient Details Name: Elijah Nixon MRN: [id number] DOB: [birth year]   Cancelled treatment:       Reason Eval/Treat Not Completed: Fatigue/lethargy limiting ability to participate;Medical issues which prohibited therapy   SLP went by to treat pt, wife present and pt is not able to participate in session d/t severity of cognitive function. Wife with no questions at this time. ST just received orders for ST to be discontinued at this time. Will acknowledge orders, ST to sign off. Please reconsult as appropriate.    Nakyah Erdmann 06/28/2017, 1:31 PM

## 2017-06-28 NOTE — Progress Notes (Signed)
[facility]  Received request from CSW [place]. Chart reviewed and spoke with spouse by phone. She is aware [facility] does not have a room to offer today. Will update [place] re availability tomorrow.   Thank you,  Forrestine Himva Davis, LCSW [phone]

## 2017-06-28 NOTE — Progress Notes (Signed)
Responded to page to support patient wife at bedside.  Provided prayer, empathetic listening and presence.  Will follow as needed.   06/28/17 1359  Clinical Encounter Type  Visited With Patient and family together;Health care provider  Visit Type Initial;Spiritual support;Patient actively dying  Referral From Nurse;Palliative care team  Spiritual Encounters  Spiritual Needs Prayer;Emotional;Grief support  Stress Factors  Family Stress Factors Family relationships;Loss  Fae PippinWatlington, Nicolai Labonte, Chaplain, [facility], Pager [phone]

## 2017-06-28 NOTE — Progress Notes (Signed)
Sat O2 93%% on  2L/min. Will continue to monitor and keep patient comfortable.

## 2017-06-28 NOTE — Progress Notes (Addendum)
3:25pm-No availability at [facility] today.   2:04pm-CSW received [facility]. CSW confirmed preference with spouse. CSW sent referral [facility] for assessment.   Elijah Cascoadia Syaire Saber LCSW [phone]

## 2017-06-28 NOTE — Progress Notes (Signed)
PROGRESS NOTE    Elijah RasmussenBilly W Volden  FAO:[id number] DOB: 09/20/1932 DOA: 06/23/2017 PCP: [name], [name], MD   Brief Narrative: 84 year old male with long-standing alcohol use (over 6 pack beer daily for almost 40-50 years), hypertension, hip fracture in January which was managed with IM nail after which he has quit drinking.  His hospitalization at that time was complicated by alcohol withdrawal which was stable and was discharged to rehab.  Patient returned home from rehabilitation 3 weeks back and has been having hallucinations since then worsened since 10 days prior to admission.  He also had a fall at home 4-5 days b prior to admission and was seen by PCP.  Patient had not sustained any injury.  Patient admitted for further management with hospital course complicated due to ongoing psychosis and encephalopathy along with aspiration pneumonia.  Assessment & Plan:   #Acute metabolic encephalopathy associated with psychosis, delirium, hallucination.  Evaluated by neurologist, thought to be due to Wernicke/Korsakoff/alcohol induced hallucination and delirium.  No seizures seen on EEG.  MRI of the brain with several motion degraded.  RPR nonreactive.  Neurologist recommended high-dose IV thiamine for 3 days and then to 50 mg IV or IM once daily.  Avoid antipsychotic. -Patient was seen by psychiatrist and is started on Risperdal as per recommendation.  Given jerking movement and restlessness the Risperdal was discontinued.  Patient with worsening encephalopathy.  Discussed with the patient's wife regarding goals of care.  Patient is already DNR/DNI.  Palliative care consult requested.  If no improvement in next 24-hour consider comfort care only.  Patient is currently n.p.o.  Added IV morphine for the management of restlessness and dyspnea.  Patient's wife agreed with the plan.  #Aspiration pneumonia: Continue antibiotics and aspiration precaution.  Currently n.p.o.  #Hypernatremia: On D5W fluid.   Increase the rate of IV fluid.  #Hypokalemia: Replete potassium chloride.  Check magnesium.  #History of alcohol abuse: Continue supportive care at vitamin.  Severe protein calorie malnutrition Essential hypertension Goals of care discussion: Patient with poor prognosis.  Avoid aggressive measures.  Focus mainly on comfort measures and continue current management for today.  I had a lengthy discussion with the patient's wife at bedside.  DVT prophylaxis: Lovenox subcutaneous Code Status: DNR/DNI Family Communication: Discussed with the patient's wife Disposition Plan: Poor prognosis, awaiting palliative care consult.  Likely discharge to hospice if no improvement.    Consultants:   Neurology  Procedures: MRI brain, EEG Antimicrobials: Unasyn  Subjective: Seen and examined at bedside.  Patient with increased rate of breathing, not responding and mild agitation.  Wife at bedside.  Objective: Vitals:   06/27/17 1604 06/28/17 0007 06/28/17 0615 06/28/17 0616  BP: 119/66 120/87  136/84  Pulse: 80 (!) 108  100  Resp: 20 20    Temp: 98.1 F (36.7 C)     TempSrc: Axillary     SpO2: 95% 95%    Weight:   71.2 kg (157 lb)   Height:        Intake/Output Summary (Last 24 hours) at 06/28/2017 1130 Last data filed at 06/28/2017 0909 Gross per 24 hour  Intake 2332.92 ml  Output 950 ml  Net 1382.92 ml   Filed Weights   06/23/17 1713 06/26/17 0640 06/28/17 0615  Weight: 66.7 kg (147 lb 0.8 oz) 70.8 kg (156 lb) 71.2 kg (157 lb)    Examination:  General exam: Mild agitation and increased breathing.  Dry mucous membrane. Respiratory system: Coarse breath sound, mild tachypnea Cardiovascular system:  S1 & S2 heard, RRR.  No pedal edema. Gastrointestinal system: Abdomen is nondistended, soft and nontender. Normal bowel sounds heard. Central nervous system: Not responding to commands Skin: No rashes, lesions or ulcers Psychiatry: Judgement and insight unable to assess    Data  Reviewed: I have personally reviewed following labs and imaging studies  CBC: Recent Labs  Lab 06/23/17 1010 06/24/17 0623 06/25/17 0617 06/26/17 0514 06/27/17 0457 06/28/17 0810  WBC 12.1* 12.5* 13.7* 17.4* 17.4* 18.7*  NEUTROABS 7.2  --   --  12.3*  --   --   HGB 11.0* 10.4* 10.2* 10.8* 10.5* 10.4*  HCT 30.4* 28.6* 28.5* 29.2* 32.8* 31.9*  MCV 95.6 97.6 98.6 97.7 94.5 94.7  PLT 418* 411* 387 400 351 284   Basic Metabolic Panel: Recent Labs  Lab 06/24/17 0623 06/24/17 1706 06/25/17 0617 06/26/17 0514 06/27/17 0457 06/28/17 0810  NA 143  --  147* 149* 153* 153*  K 4.3  --  4.0 4.1 4.5 3.1*  CL 116*  --  118* 119* 124* 123*  CO2 17*  --  17* 19* 18* 20*  GLUCOSE 69  --  81 134* 77 132*  BUN 12  --  12 9 10 13   CREATININE 0.88  --  0.93 0.90 1.11 1.19  CALCIUM 8.2*  --  8.4* 8.6* 8.4* 7.9*  MG  --   --  2.0  --   --   --   PHOS  --  3.9  --   --   --   --    GFR: Estimated Creatinine Clearance: 44.7 mL/min (by C-G formula based on SCr of 1.19 mg/dL). Liver Function Tests: Recent Labs  Lab 06/23/17 1010 06/24/17 1706 06/26/17 0514  AST 46*  --  45*  ALT 54  --  52  ALKPHOS 157* 145* 148*  BILITOT 0.5  --  0.9  PROT 5.8*  --  5.6*  ALBUMIN 2.6*  --  2.3*   No results for input(s): LIPASE, AMYLASE in the last 168 hours. Recent Labs  Lab 06/23/17 1315  AMMONIA 18   Coagulation Profile: No results for input(s): INR, PROTIME in the last 168 hours. Cardiac Enzymes: No results for input(s): CKTOTAL, CKMB, CKMBINDEX, TROPONINI in the last 168 hours. BNP (last 3 results) No results for input(s): PROBNP in the last 8760 hours. HbA1C: No results for input(s): HGBA1C in the last 72 hours. CBG: Recent Labs  Lab 06/27/17 0336  GLUCAP 77   Lipid Profile: No results for input(s): CHOL, HDL, LDLCALC, TRIG, CHOLHDL, LDLDIRECT in the last 72 hours. Thyroid Function Tests: No results for input(s): TSH, T4TOTAL, FREET4, T3FREE, THYROIDAB in the last 72  hours. Anemia Panel: No results for input(s): VITAMINB12, FOLATE, FERRITIN, TIBC, IRON, RETICCTPCT in the last 72 hours. Sepsis Labs: Recent Labs  Lab 06/23/17 1028 06/25/17 0804 06/26/17 0514 06/27/17 0457  PROCALCITON  --  <0.10 <0.10 0.11  LATICACIDVEN 0.81  --   --   --     Recent Results (from the past 240 hour(s))  Urine Culture     Status: None   Collection Time: 06/19/17  2:32 PM  Result Value Ref Range Status   MICRO NUMBER: [id number]  Final   SPECIMEN QUALITY: ADEQUATE  Final   Sample Source URINE  Final   STATUS: FINAL  Final   Result: No Growth  Final  MRSA PCR Screening     Status: None   Collection Time: 06/23/17  8:32 PM  Result Value  Ref Range Status   MRSA by PCR NEGATIVE NEGATIVE Final    Comment:        The GeneXpert MRSA Assay (FDA approved for NASAL specimens only), is one component of a comprehensive MRSA colonization surveillance program. It is not intended to diagnose MRSA infection nor to guide or monitor treatment for MRSA infections. Performed at [facility], [address], [place], [place] [address]   Culture, Urine     Status: None   Collection Time: 06/26/17  2:30 PM  Result Value Ref Range Status   Specimen Description URINE, CATHETERIZED  Final   Special Requests NONE  Final   Culture   Final    NO GROWTH Performed at [facility], [address] [place], [place] [address]    Report Status 06/27/2017 FINAL  Final         Radiology Studies: No results found.      Scheduled Meds: . aspirin EC  81 mg Oral Daily  . B-complex with vitamin C  1 tablet Oral Daily  . calcium-vitamin D  1 tablet Oral Q breakfast  . chlorhexidine  15 mL Mouth Rinse BID  . doxazosin  8 mg Oral Daily  . enoxaparin (LOVENOX) injection  40 mg Subcutaneous Q24H  . folic acid  1 mg Oral Daily  . mouth rinse  15 mL Mouth Rinse q12n4p  . multivitamin  1 tablet Oral Daily  . sodium chloride flush  3 mL Intravenous Q12H  . traZODone   150 mg Oral QPM   Continuous Infusions: . ampicillin-sulbactam (UNASYN) IV Stopped (06/28/17 0751)  . dextrose 1,000 mL (06/28/17 0720)  . potassium chloride    . thiamine injection Stopped (06/28/17 0939)     LOS: 2 days    Lavida Patch Jaynie Collins, MD [facility] Pager [phone]  If 7PM-7AM, please contact night-coverage [email or web address] Password [email or web address] 06/28/2017, 11:30 AM

## 2017-06-28 NOTE — Consult Note (Signed)
Consultation Note Date: 06/28/2017   Patient Name: Elijah Nixon  DOB: 1932-04-27  MRN: [id number]  Age / Sex: 84 y.o., male  PCP: Tower, Wynelle Fanny, MD Referring Physician: Rosita Fire, MD  Reason for Consultation: Establishing goals of care  HPI/Patient Profile: 84 y.o. male  admitted on 06/23/2017 with hallucinations. He has a past medical history significant for recent alcohol cessation(over 6 pack beer daily for almost 40-50 years), hypertension, IM nail on 04/28/2017, Grover's disease, and macular degeneration. He was recently discharge from skilled nursing facility 2 weeks ago.  He had a fall at home again about 4 or 5 days ago and was seen by his primary care physician Dr. Glori Bickers and was felt to be okay he did not have any injuries.  Hallucinating in February when he was admitted to the hospital at that admission and seemed to have improved.  He has not drank in 2 months.  His blood pressures have been relatively soft and Dr. Glori Bickers had decreased his amlodipine to 5 mg from 10 mg previously.  His wife says he has been declining over the past week and did not go to physical therapy or occupational therapy yesterday due to the weakness and the hallucinations.  In the emergency department he was agitated, fidgety, and having some hallucinations. He denies cough or nausea, vomiting, diarrhea, or UTI symptoms.  Patient admitted for further management with hospital course complicated due to ongoing psychosis and encephalopathy along with aspiration pneumonia.   Palliative medicine consulted for goals of care.   Clinical Assessment and Goals of Care: I have reviewed medical records including MAR, lab results, radiology results, received report from bedside RN. I assessed the patient and then met at the bedside with Elijah Nixon (wife/POA)  to discuss diagnosis prognosis, GOC, EOL wishes, disposition and  options.  I introduced Palliative Medicine as specialized medical care for people living with serious illness. It focuses on providing relief from the symptoms and stress of a serious illness. The goal is to improve quality of life for both the patient and the family.  We discussed a brief life review of the patient. Elijah Nixon stated that retired from the [employer] after 33 years. He was a Education administrator. He was also involved with the [employer] for almost 10 years. They have 2 sons together. Elijah Nixon loved to travel especially to the beach as well as fishing. He was a man of [place].   As far as functional and nutritional status his wife reports she has noticed a decline in his mental status over the past year. He would often exhibit signs of forgetfulness. She stated he no longer drove due to the severity of his macular degeneration. She was tearful when talking but addressed that she constantly stayed on him regarding his drinking habits. Since his fall in Jan. 2019 and having surgery due to a hip fracture she feels that he never recovered and has continued showing signs of decline.   We discussed his current  illness and what it means in the larger context of his on-going co-morbidities.  Natural disease trajectory and expectations at EOL were discussed. Again Elijah Nixon was tearful but verbalized that she could see that he was tired and knew that he would not fully recover to a quality of life he would appreciate. She stated they had discussions in the past regarding their wishes and he expressed not wanting heroic measures such as CPR or intubation, and if he was ever in a point of no return to be able to care for himself or recognize his family or himself he would like to pass away and not forego extensive medical treatments.   I attempted to elicit values and goals of care important to the patient and family.   The difference between aggressive  medical intervention and comfort care was considered in light of the patient's goals of care. Hospice and Palliative Care services outpatient were explained and offered. At this time family request to discontinue any aggressive measures and allow Elijah Nixon to have a peaceful and comfort EOL transition. We discussed the goal of comfort care and that care would be focused on symptom management and comfort, this would not include IV fluids, antibiotics, medications not aimed at symptom relief or management. Family verbalized understanding and expressed to make sure that during this process he would not suffer or experience pain. Educated that staff in hospital and with outpatient hospice goal would be to make sure he is comfortable and frequently assess for non-verbal signs of discomfort, pain, anxiety, agitation, shortness of breath etc. Family verbalized understanding and appreciation for detailed conversation and supportive plan.   Family verbalized need for residential hospice facility versus in-home services due to level of care needed and wife being unable to provide, along with knowing specialized personnel would be available 24/7 for medication/symptom management. Wife also expressed anxiousness knowing that he could pass in the home.   Questions and concerns were addressed.  The family was encouraged to call with questions or concerns.  PMT will continue to support holistically.  HCPOA-Elijah Nixon (Wife/HCPOA)    SUMMARY OF RECOMMENDATIONS    DNR/DNI  FULL COMFORT MEASURES   CSW consult placed for residential hospice placement per family request. Family requesting [place] location if possible as wife resides in [place]  Discontinue medications not aimed at comfort. RN administering K+ runs. Encouraged to continue with current orders unless attending discontinued. Would not recommend additional dosing s/p infusions.   Chaplain support for wife and sons  Comfort feeds as  appropriate. Discussed with wife the risk of aspiration. She verbalized awareness but felt it was important to allow him comfort feeds during his transition.   Continue with PRN ativan, zofran, trazodone, and tylenol per attending  Will increase IV morphine to every hour PRN for pain, dyspnea, and agitation  Will decrease IVF to [facility]  Palliative to continue to support medical team, patient, and family during hospitalization as needed.    Code Status/Advance Care Planning:  DNR  Palliative Prophylaxis:   Aspiration, Delirium Protocol, Frequent Pain Assessment, Oral Care and Turn Reposition  Additional Recommendations (Limitations, Scope, Preferences):  Full Comfort Care  Psycho-social/Spiritual:   Desire for further Chaplaincy support:yes  Additional Recommendations: Education on Hospice  Prognosis:   < 2 weeks- in the setting of encephalopathy, aspiration pna, immobility, ?[place]off, alcoholic hallucinations, and malnutrition  Discharge Planning: Hospice facility      Primary Diagnoses: Present on Admission: . Essential hypertension . Grover's disease . 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 The following aspects are pertinent.  Past Medical History:  Diagnosis Date  . AA (alcohol abuse)    at least 8-10 drinks/day  . Arthritis    OA  . DDD (degenerative disc disease)   . ED (erectile dysfunction)   . Grover's disease   . History of colon polyps   . History of diverticulitis of colon   . Hypertension   . Macular degeneration    dry  . Osteoporosis    spinal compression fx   Social History   Socioeconomic History  . Marital status: Married    Spouse name: None  . Number of children: None  . Years of education: None  . Highest education level: None  Social Needs  . Financial resource strain: None  . Food  insecurity - worry: None  . Food insecurity - inability: None  . Transportation needs - medical: None  . Transportation needs - non-medical: None  Occupational History  . None  Tobacco Use  . Smoking status: Never Smoker  . Smokeless tobacco: Never Used  Substance and Sexual Activity  . Alcohol use: Yes    Alcohol/week: 6.0 oz    Types: 10 Cans of beer per week    Comment: 10 cans throughout the day   . Drug use: No  . Sexual activity: No  Other Topics Concern  . None  Social History Narrative  . None   Family History  Problem Relation Age of Onset  . Diabetes Son   . Dementia Mother    Scheduled Meds: . aspirin EC  81 mg Oral Daily  . B-complex with vitamin C  1 tablet Oral Daily  . calcium-vitamin D  1 tablet Oral Q breakfast  . chlorhexidine  15 mL Mouth Rinse BID  . doxazosin  8 mg Oral Daily  . enoxaparin (LOVENOX) injection  40 mg Subcutaneous Q24H  . folic acid  1 mg Oral Daily  . mouth rinse  15 mL Mouth Rinse q12n4p  . multivitamin  1 tablet Oral Daily  . sodium chloride flush  3 mL Intravenous Q12H  . traZODone  150 mg Oral QPM   Continuous Infusions: . ampicillin-sulbactam (UNASYN) IV Stopped (06/28/17 0751)  . dextrose 500 mL (06/28/17 1143)  . potassium chloride Stopped (06/28/17 1236)  . thiamine injection Stopped (06/28/17 0939)   PRN Meds:.acetaminophen **OR** acetaminophen, hydrocortisone cream, LORazepam, morphine injection, ondansetron **OR** ondansetron (ZOFRAN) IV, polyethylene glycol Medications Prior to Admission:  Prior to Admission medications   Medication Sig Start Date End Date Taking? Authorizing Provider  amLODipine (NORVASC) 10 MG tablet TAKE 1 TABLET BY MOUTH DAILY Patient taking differently: Take 5 mg by mouth daily.  10/17/16  Yes Tower, Wynelle Fanny, MD  aspirin 81 MG tablet Take 81 mg by mouth daily.     Yes [provider]  b complex vitamins tablet Take 1 tablet by mouth daily.   Yes [provider]  Calcium  Carbonate-Vit D-Min 600-400 MG-UNIT TABS Take 1 tablet by mouth daily.    Yes [provider]  Cyanocobalamin (VITAMIN B-12 PO) Take 1 tablet by mouth daily.   Yes [provider]  doxazosin (CARDURA) 8 MG tablet TAKE ONE (1) TABLET BY MOUTH EVERY DAY Patient taking differently: Take 8 mg by mouth daily.  10/17/16  Yes Tower, Wynelle Fanny, MD  ferrous sulfate 325 (65 FE)  MG tablet Take 1 tablet (325 mg total) by mouth 3 (three) times daily with meals. 05/02/17  Yes Reyne Dumas, MD  folic acid (FOLVITE) 1 MG tablet Take 1 tablet (1 mg total) by mouth daily. 05/02/17  Yes Reyne Dumas, MD  ibuprofen (ADVIL,MOTRIN) 200 MG tablet Take 200 mg by mouth every 6 (six) hours as needed for fever or headache.   Yes [provider]  Multiple Vitamins-Minerals (PRESERVISION/LUTEIN) CAPS Take 1 capsule by mouth daily.     Yes [provider]  potassium chloride SA (K-DUR,KLOR-CON) 20 MEQ tablet Take 2 tablets (40 mEq total) by mouth 2 (two) times daily. 05/02/17  Yes Reyne Dumas, MD  spironolactone (ALDACTONE) 25 MG tablet Take 0.5 tablets (12.5 mg total) by mouth daily. 10/17/16  Yes Tower, Wynelle Fanny, MD   Allergies  Allergen Reactions  . Lisinopril Other (See Comments)    REACTION: increased potassium leveles   Review of Systems  Unable to perform ROS: Mental status change    Physical Exam  Constitutional: He appears lethargic. He has a sickly appearance.  Cardiovascular: Normal rate and regular rhythm.  Pulmonary/Chest: Tachypnea noted.  Musculoskeletal:  Generalized weakness   Neurological: He appears lethargic. He is disoriented.  Does not follow commands, mild agitation when awaken   Nursing note and vitals reviewed.   Vital Signs: BP 136/84   Pulse 100   Temp 98.1 F (36.7 C) (Axillary)   Resp 20   Ht '5\' 7"'$  (1.702 m)   Wt 71.2 kg (157 lb)   SpO2 95%   BMI 24.59 kg/m  Pain Assessment: PAINAD   Pain Score: 0-No pain   SpO2: SpO2: 95 % O2 Device:SpO2:  95 % O2 Flow Rate: .   IO: Intake/output summary:   Intake/Output Summary (Last 24 hours) at 06/28/2017 1244 Last data filed at 06/28/2017 1136 Gross per 24 hour  Intake 2432.92 ml  Output 950 ml  Net 1482.92 ml    LBM: Last BM Date: 06/20/17 Baseline Weight: Weight: 65.8 kg (145 lb) Most recent weight: Weight: 71.2 kg (157 lb)     Palliative Assessment/Data: PPS 20%   Time In: 1130 Time Out: 1245 Time Total: 75 min  Greater than 50%  of this time was spent counseling and coordinating care related to the above assessment and plan.  Signed by:  Alda Lea, NP-BC Palliative Medicine Team  Phone: [phone] Fax: [phone]  I agree with above consult and plan/co-sign   Wadie Lessen NP   Please contact Palliative Medicine Team phone at [phone] for questions and concerns.  For individual provider: See Shea Evans

## 2017-06-29 DIAGNOSIS — Z66 Do not resuscitate: Secondary | ICD-10-CM

## 2017-06-29 DIAGNOSIS — Z515 Encounter for palliative care: Secondary | ICD-10-CM

## 2017-06-29 DIAGNOSIS — R627 Adult failure to thrive: Secondary | ICD-10-CM

## 2017-06-29 MED ORDER — GLYCOPYRROLATE 1 MG PO TABS: 1.0000 mg | ORAL_TABLET | Freq: Three times a day (TID) | ORAL | 0 refills | Status: AC

## 2017-06-29 MED ORDER — MORPHINE SULFATE 20 MG/5ML PO SOLN: 2.5000 mg | ORAL | 0 refills | Status: AC | PRN

## 2017-06-29 MED ORDER — LORAZEPAM 1 MG PO TABS: 1.0000 mg | ORAL_TABLET | ORAL | 0 refills | Status: AC | PRN

## 2017-06-29 NOTE — Care Management Important Message (Signed)
Important Message  Patient Details  Name: Elijah RasmussenBilly W Friscia MRN: [id number] Date of Birth: [birth year]   Medicare Important Message Given:  Yes    Dorena BodoIris Charistopher Rumble 06/29/2017, 3:12 PM

## 2017-06-29 NOTE — Progress Notes (Signed)
CSW sent referral to [facility] for assessment. [facility] does not have availability for today. Patient's wife aware.   Osborne Cascoadia Riona Lahti LCSW [phone]

## 2017-06-29 NOTE — Progress Notes (Signed)
Pt discharged to [facility]. Report called to Lupita LeashDonna at receiving facility. Pt left unit via stretcher, to be transported by [facility]. Foley and PIV remaining in place at time of discharge, per facility request. Pts wife at bedside, aware of dc plan.

## 2017-06-29 NOTE — Progress Notes (Signed)
[place]:   Met with pt and his wife who is resided with at home prior to coming into hospitalization. Discussed the hospice philosophy and they are on board with comfort care. Spoke to our MD at [facility] at [facility] and she did approve the pt to come for EOL care. Bed offered and accepted. Ambulance called by Arta Silence and the MD is aware. Webb Silversmith RN [phone]

## 2017-06-29 NOTE — Progress Notes (Signed)
Patient will DC to: [facility] Anticipated DC date: 06/29/17 Family notified: Spouse Transport by: Sharin MonsPTAR    Per MD patient ready for DC to Hospice. RN, patient, patient's family, and facility notified of DC. Discharge Summary sent to facility. RN given number for report [phone]). DC packet on chart. Ambulance transport requested for patient.   CSW signing off.  Cristobal GoldmannNadia Jakell Trusty, LCSW Clinical Social Worker [phone]

## 2017-06-29 NOTE — Discharge Summary (Signed)
Physician Discharge Summary  Elijah Nixon AVW:[id number] DOB: [birth year] DOA: 06/23/2017  PCP: Judy Pimple, MD  Admit date: 06/23/2017 Discharge date: 06/29/2017  Admitted From:home Disposition:[facility] hospice when bed is available.   Recommendations for Outpatient Follow-up:  Follow up with hospice  Home Health:hospice Equipment/Devices:hospice Discharge Condition:hospice CODE STATUS:DNR Diet recommendation:as tolerated  Brief/Interim Summary: 84 year old male with long-standing alcohol use (over 6 pack beer daily for almost 40-50 years), hypertension, hip fracture in January which was managed with IM nail after which he has quit drinking. His hospitalization at that time was complicated by alcohol withdrawal which was stable and was discharged to rehab. Patient returned home from rehabilitation 3 weeks back and has been having hallucinations since then worsened since 10 days prior to admission. He also had a fall at home 4-5 days b prior to admission and was seen by PCP. Patient had not sustained any injury. Patient admitted for further management with hospital course complicated due to ongoing psychosis and encephalopathy along with aspiration pneumonia.  #Acute metabolic encephalopathy associated with psychosis, delirium, hallucination.  Evaluated by neurologist, thought to be due to Wernicke/Korsakoff/alcohol induced hallucination and delirium.  No seizures seen on EEG.  MRI of the brain with several motion degraded.  RPR nonreactive.  Neurologist recommended high-dose IV thiamine for 3 days and then to 50 mg IV or IM once daily.   -Patient with no clinical improvement.  Monitor by palliative care.  Discussed with the patient's wife.  Patient is now DNR/DNI and comfort measures only.  Discussed with the social worker regarding transfer his care to residential hospice.  Patient has poor prognosis.  Discharge medications are adjusted for comfort only.  I have discussed with the  patient's wife today.  He looks comfortable.  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 (HCC)   Protein-calorie malnutrition, severe   Hypernatremia    Discharge Instructions  Discharge Instructions    Diet - low sodium heart healthy   Complete by:  As directed    Increase activity slowly   Complete by:  As directed      Allergies as of 06/29/2017      Reactions   Lisinopril Other (See Comments)   REACTION: increased potassium leveles      Medication List    STOP taking these medications   amLODipine 10 MG tablet Commonly known as:  NORVASC   aspirin 81 MG tablet   b complex vitamins tablet   Calcium Carbonate-Vit D-Min 600-400 MG-UNIT Tabs   doxazosin 8 MG tablet Commonly known as:  CARDURA   ferrous sulfate 325 (65 FE) MG tablet   folic acid 1 MG tablet Commonly known as:  FOLVITE   ibuprofen 200 MG tablet Commonly known as:  ADVIL,MOTRIN   potassium chloride SA 20 MEQ tablet Commonly known as:  K-DUR,KLOR-CON   PRESERVISION/LUTEIN Caps   spironolactone 25 MG tablet Commonly known as:  ALDACTONE   VITAMIN B-12 PO     TAKE these medications   glycopyrrolate 1 MG tablet Commonly known as:  ROBINUL Take 1 tablet (1 mg total) by mouth 3 (three) times daily.   LORazepam 1 MG tablet Commonly known as:  ATIVAN Take 1 tablet (1 mg total) by mouth every 4 (four) hours as needed for anxiety.   morphine 20 MG/5ML solution Take 0.6 mLs (2.4 mg total) by mouth every 2 (two) hours as needed for pain.      Follow-up Information    Tower, Audrie Gallus, MD. Schedule an  appointment as soon as possible for a visit.   Specialties:  Family Medicine, Radiology Contact information: [address] [place] [address] [phone]          Allergies  Allergen Reactions  . Lisinopril Other (See Comments)    REACTION: increased potassium leveles    Consultations: Neurology  Procedures/Studies: MRI brain, EEG  Subjective: Seen and examined at bedside.  Patient was not responsive and looked comfortable.  Review of systems limited.  Discharge Exam: Vitals:   06/28/17 1523 06/28/17 1600  BP: (!) 120/49   Pulse: (!) 103   Resp: (!) 22   Temp: 98.3 F (36.8 C)   SpO2: (!) 87% 93%   Vitals:   06/28/17 0616 06/28/17 1523 06/28/17 1600 06/29/17 0500  BP: 136/84 (!) 120/49    Pulse: 100 (!) 103    Resp:  (!) 22    Temp:  98.3 F (36.8 C)    TempSrc:  Oral    SpO2:  (!) 87% 93%   Weight:    68 kg (150 lb)  Height:        General: Ill looking male lying in bed, not agitated. Cardiovascular: RRR, S1/S2 +, no rubs, no gallops Respiratory: Coarse breath bilateral.   Abdominal: Soft, bowel sounds + Extremities: no edema    The results of significant diagnostics from this hospitalization (including imaging, microbiology, ancillary and laboratory) are listed below for reference.     Microbiology: Recent Results (from the past 240 hour(s))  Urine Culture     Status: None   Collection Time: 06/19/17  2:32 PM  Result Value Ref Range Status   MICRO NUMBER: [id number]  Final   SPECIMEN QUALITY: ADEQUATE  Final   Sample Source URINE  Final   STATUS: FINAL  Final   Result: No Growth  Final  MRSA PCR Screening     Status: None   Collection Time: 06/23/17  8:32 PM  Result Value Ref Range Status   MRSA by PCR NEGATIVE NEGATIVE Final    Comment:        The GeneXpert MRSA Assay (FDA approved for NASAL specimens only), is one component of a comprehensive MRSA colonization surveillance program. It is not intended to diagnose  MRSA infection nor to guide or monitor treatment for MRSA infections. Performed at [facility], [address], [place], [place] [address]   Culture, Urine     Status: None   Collection Time: 06/26/17  2:30 PM  Result Value Ref Range Status   Specimen Description URINE, CATHETERIZED  Final   Special Requests NONE  Final   Culture   Final    NO GROWTH Performed at [facility], [address], [place], [place] [address]    Report Status 06/27/2017 FINAL  Final     Labs: BNP (last 3 results) Recent Labs    06/23/17 1010  BNP 58.5   Basic Metabolic Panel: Recent Labs  Lab 06/24/17 0623 06/24/17 1706 06/25/17 0617 06/26/17 0514 06/27/17 0457 06/28/17 0810  NA 143  --  147* 149* 153* 153*  K 4.3  --  4.0 4.1 4.5 3.1*  CL 116*  --  118* 119* 124* 123*  CO2 17*  --  17* 19* 18* 20*  GLUCOSE 69  --  81 134* 77 132*  BUN 12  --  12 9 10 13   CREATININE 0.88  --  0.93 0.90 1.11 1.19  CALCIUM 8.2*  --  8.4* 8.6* 8.4* 7.9*  MG  --   --  2.0  --   --   --   PHOS  --  3.9  --   --   --   --    Liver Function Tests: Recent Labs  Lab 06/23/17 1010 06/24/17 1706 06/26/17 0514  AST 46*  --  45*  ALT 54  --  52  ALKPHOS 157* 145* 148*  BILITOT 0.5  --  0.9  PROT 5.8*  --  5.6*  ALBUMIN 2.6*  --  2.3*   No results for input(s): LIPASE, AMYLASE in the last 168 hours. Recent Labs  Lab 06/23/17 1315  AMMONIA 18   CBC: Recent Labs  Lab 06/23/17 1010 06/24/17 0623 06/25/17 0617 06/26/17 0514 06/27/17 0457 06/28/17 0810  WBC 12.1* 12.5* 13.7* 17.4* 17.4* 18.7*  NEUTROABS 7.2  --   --  12.3*  --   --   HGB 11.0* 10.4* 10.2* 10.8* 10.5* 10.4*  HCT 30.4* 28.6* 28.5* 29.2* 32.8* 31.9*  MCV 95.6 97.6 98.6 97.7 94.5 94.7  PLT 418* 411* 387 400 351 284   Cardiac Enzymes: No results for input(s): CKTOTAL, CKMB, CKMBINDEX, TROPONINI in the last 168 hours. BNP: Invalid input(s): POCBNP CBG: Recent Labs  Lab 06/27/17 0336  GLUCAP 77   D-Dimer No  results for input(s): DDIMER in the last 72 hours. Hgb A1c No results for input(s): HGBA1C in the last 72 hours. Lipid Profile No results for input(s): CHOL, HDL, LDLCALC, TRIG, CHOLHDL, LDLDIRECT in the last 72 hours. Thyroid function studies No results for input(s): TSH, T4TOTAL, T3FREE, THYROIDAB in the last 72 hours.  Invalid input(s): FREET3 Anemia work up No results for input(s): VITAMINB12, FOLATE, FERRITIN, TIBC, IRON, RETICCTPCT in the last 72 hours. Urinalysis    Component Value Date/Time   COLORURINE GREEN (A) 06/25/2017 0303   APPEARANCEUR TURBID (A) 06/25/2017 0303   LABSPEC 1.015 06/25/2017 0303   PHURINE 5.0 06/25/2017 0303   GLUCOSEU NEGATIVE 06/25/2017 0303   HGBUR LARGE (A) 06/25/2017 0303   BILIRUBINUR NEGATIVE 06/25/2017 0303   BILIRUBINUR Negative 06/19/2017 1419   KETONESUR 5 (A) 06/25/2017 0303   PROTEINUR 100 (A) 06/25/2017 0303   UROBILINOGEN 0.2 06/19/2017 1419   NITRITE NEGATIVE 06/25/2017 0303   LEUKOCYTESUR NEGATIVE 06/25/2017 0303   Sepsis Labs Invalid input(s): PROCALCITONIN,  WBC,  LACTICIDVEN Microbiology Recent Results (from the past 240 hour(s))  Urine Culture     Status: None   Collection Time: 06/19/17  2:32 PM  Result Value Ref Range Status   MICRO NUMBER: [id number]  Final   SPECIMEN QUALITY: ADEQUATE  Final   Sample Source URINE  Final   STATUS: FINAL  Final   Result: No Growth  Final  MRSA PCR Screening     Status: None   Collection Time: 06/23/17  8:32 PM  Result Value Ref Range Status   MRSA by PCR NEGATIVE NEGATIVE Final    Comment:        The GeneXpert MRSA Assay (FDA approved for NASAL specimens only), is one component of a comprehensive MRSA colonization surveillance program. It is not intended to diagnose MRSA infection nor to guide or monitor treatment for MRSA infections.  Performed at [facility], [address] [place], [place] [address]   Culture, Urine     Status: None   Collection Time: 06/26/17  2:30  PM  Result Value Ref Range Status   Specimen Description URINE, CATHETERIZED  Final   Special Requests NONE  Final   Culture   Final    NO GROWTH Performed at [facility], [address] [place], [place] [address]    Report Status 06/27/2017 FINAL  Final     Time coordinating discharge: 28 minutes  SIGNED:   Maxie Barbron Prasad Farzana Koci, MD  [facility] 06/29/2017, 9:33 AM  If 7PM-7AM, please contact night-coverage [email or web address] Password [email or web address]

## 2017-07-04 ENCOUNTER — Telehealth: Payer: Self-pay

## 2017-07-04 NOTE — Telephone Encounter (Signed)
Copied from CRM [phone]. Topic: General - Deceased Patient >> Jul 04, 2017  2:53 PM Clack, Princella PellegriniJessica D wrote: Reason for CRM: Pt wife Steward DroneBrenda called and stated pt passed away on 06/30/16  Route to department's PEC Pool.

## 2017-07-13 ENCOUNTER — Other Ambulatory Visit: Payer: Medicare Other

## 2017-07-16 DEATH — deceased

## 2017-07-23 ENCOUNTER — Ambulatory Visit: Payer: Medicare Other | Admitting: Family Medicine

## 2017-10-17 ENCOUNTER — Ambulatory Visit: Payer: Medicare Other

## 2017-10-30 ENCOUNTER — Ambulatory Visit: Payer: Medicare Other | Admitting: Family Medicine

## 2019-03-20 IMAGING — CT CT ANGIO CHEST
2 of 7 series · 18 of 46 positions shown · IV contrast (APPLIED)
Comparison: Chest radiograph dated 06/09/2017

CLINICAL DATA: 84-year-old male with concern for PE.

EXAM:
CT ANGIOGRAPHY CHEST WITH CONTRAST
TECHNIQUE: Multidetector CT imaging of the chest was performed using the
standard protocol during bolus administration of intravenous
contrast. Multiplanar CT image reconstructions and MIPs were
obtained to evaluate the vascular anatomy.
CONTRAST:  100mL [id number] IOPAMIDOL ([id number]) INJECTION 76%

[Series 7: thins · axial · 0.61mm/px · z∈[+1259,+1488]mm · 15 of 370 slices shown]
[im 21/370  lung]
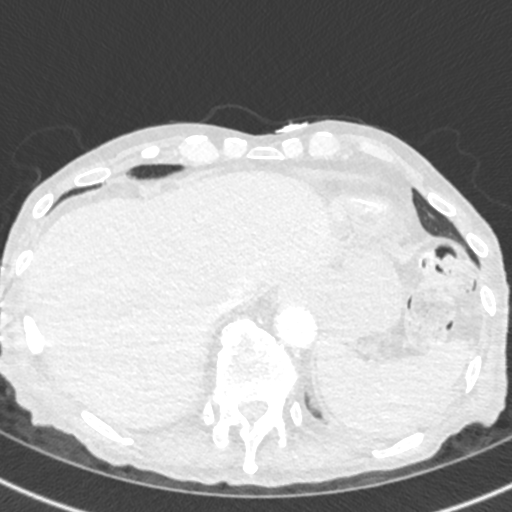
[im 42/370  soft-tissue]
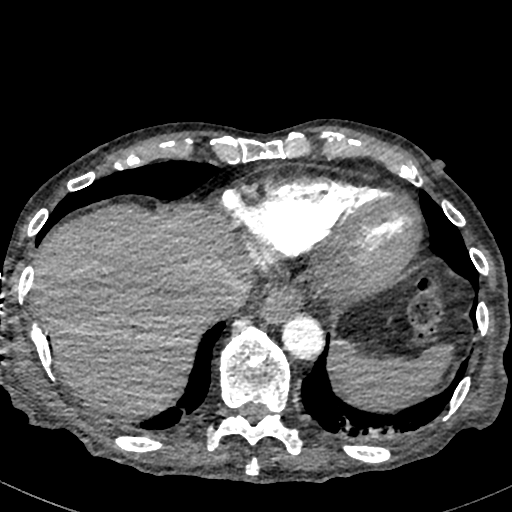
[im 62/370  lung]
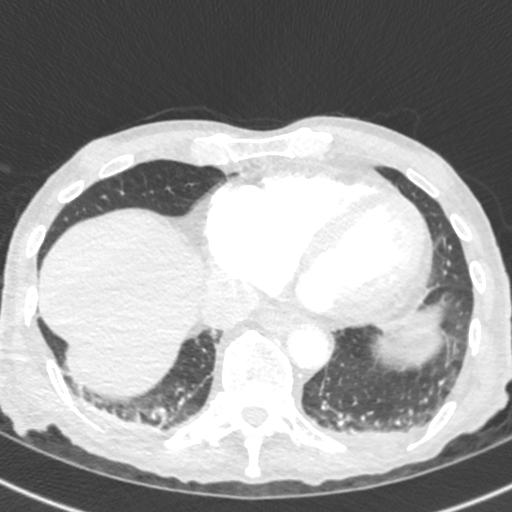
[im 83/370  soft-tissue]
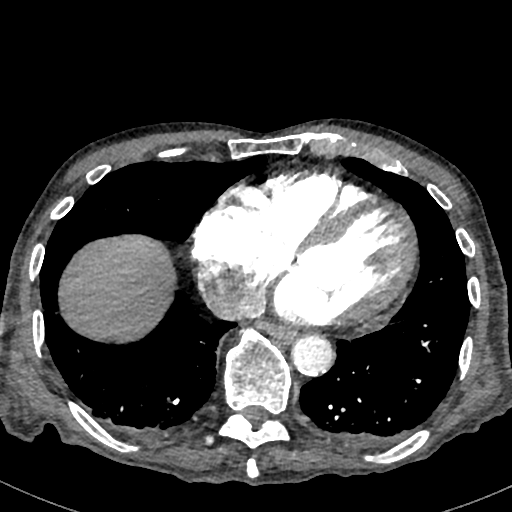
[im 124/370  lung]
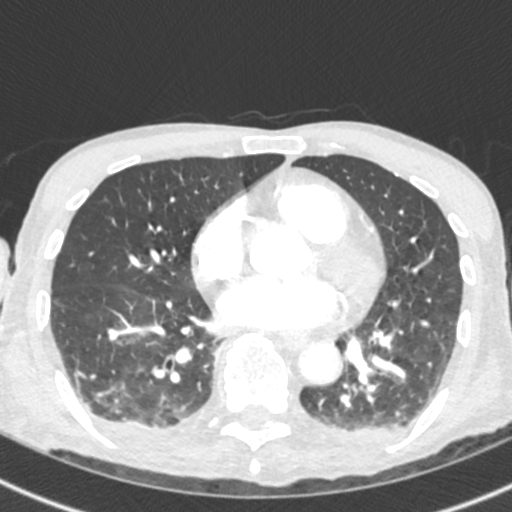
[im 144/370  soft-tissue]
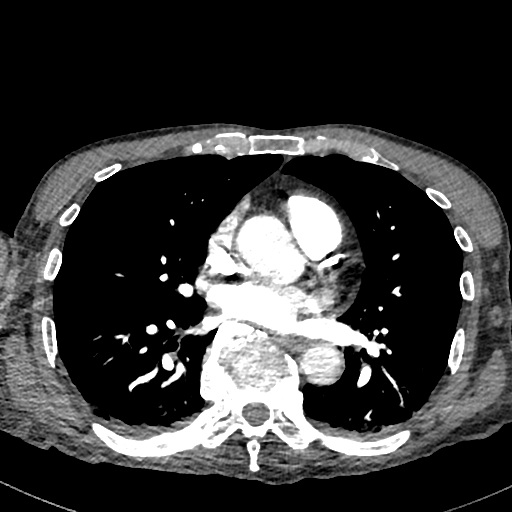
[im 165/370  lung]
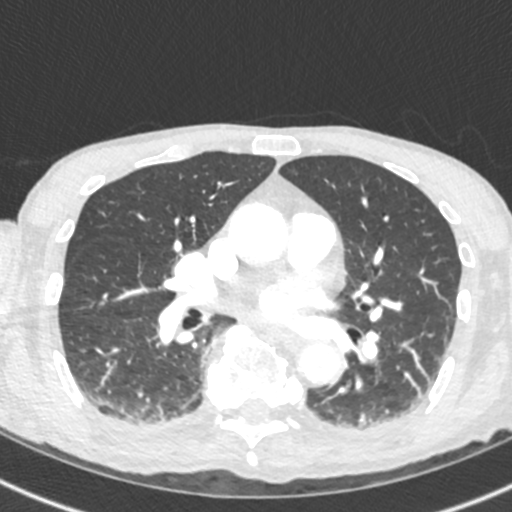
[im 185/370  soft-tissue]
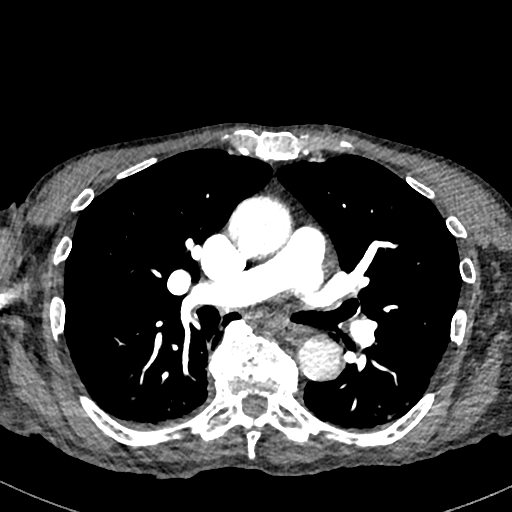
[im 206/370  lung]
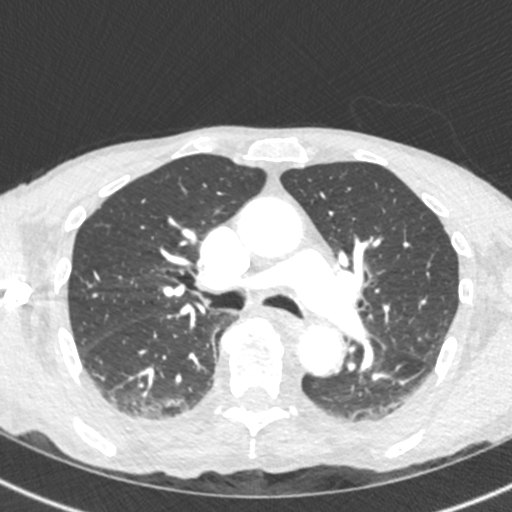
[im 226/370  soft-tissue]
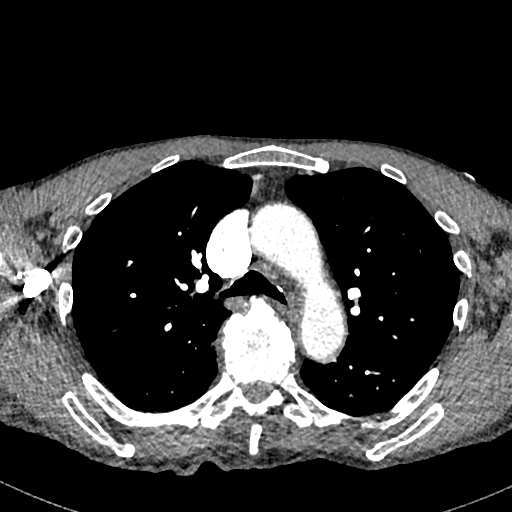
[im 247/370  lung]
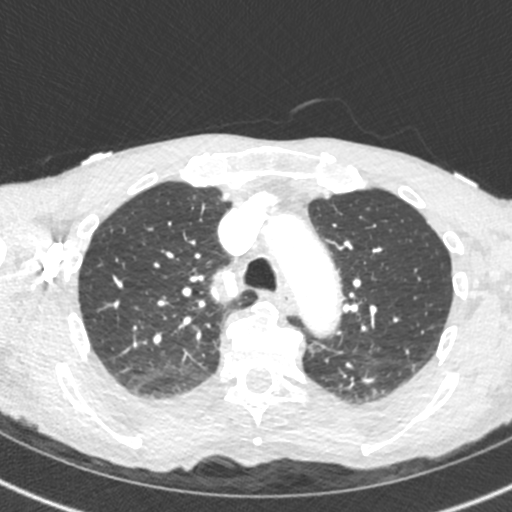
[im 288/370  soft-tissue]
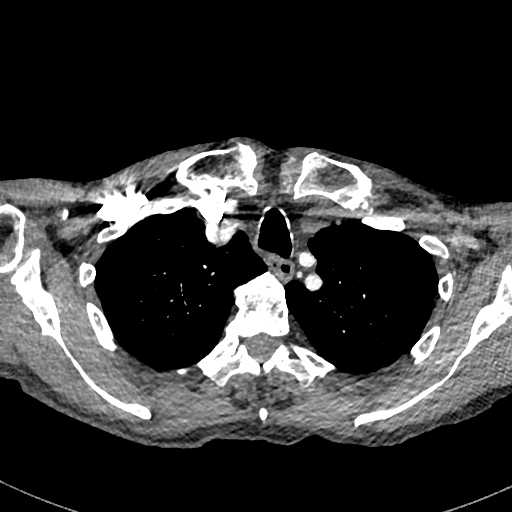
[im 308/370  lung]
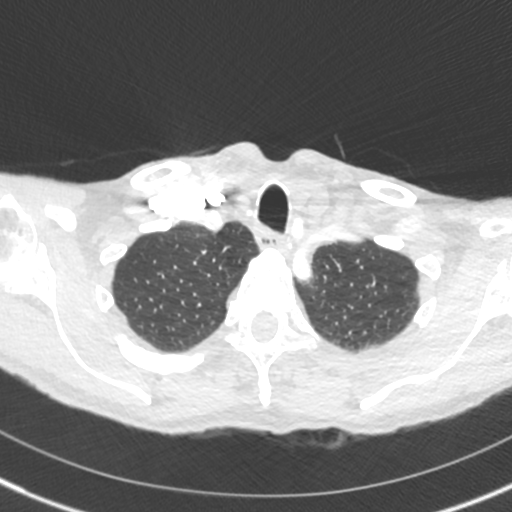
[im 329/370  soft-tissue]
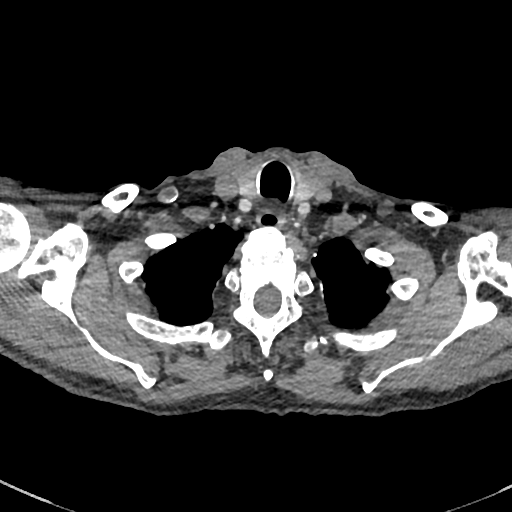
[im 349/370  lung]
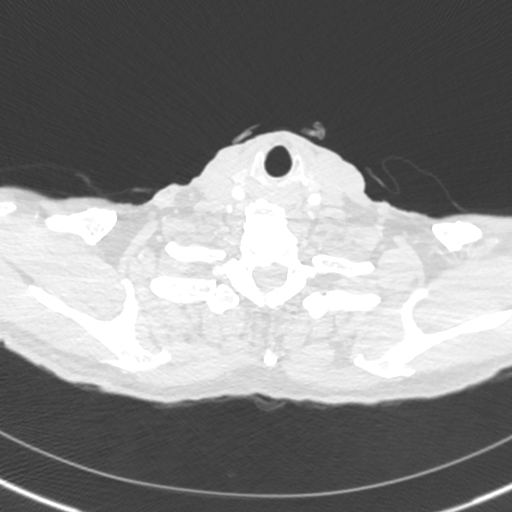

[Series 8: cor · coronal · 0.51mm/px · 3 of 109 slices shown]
[im 28/109  soft-tissue]
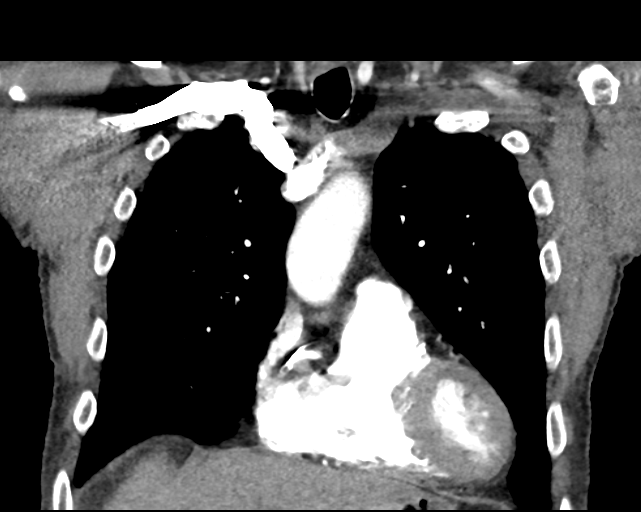
[im 55/109  soft-tissue]
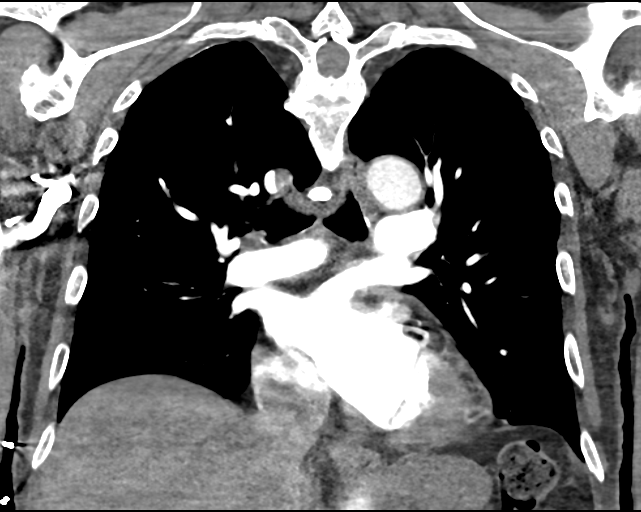
[im 82/109  soft-tissue]
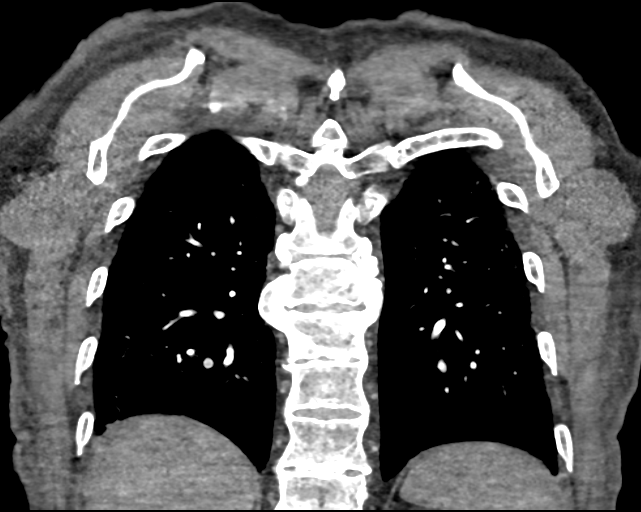

[18 of 46 positions shown; findings below may reference images not displayed]

FINDINGS: Cardiovascular: There is mild cardiomegaly. No pericardial effusion.
Multi vessel coronary vascular calcification. Mild atherosclerotic
calcification of the thoracic aorta. The origins of the great
vessels of the aortic arch are patent. No CT evidence of pulmonary
embolism

Mediastinum/Nodes: No hilar or mediastinal adenopathy. Esophagus and
the thyroid gland are grossly unremarkable. No mediastinal fluid
collection.

Lungs/Pleura: Small bilateral pleural effusions. Bibasilar linear
atelectasis/scarring. There is no focal consolidation, or
pneumothorax. The central airways are patent.

Upper Abdomen: No acute abnormality.

Musculoskeletal: Degenerative changes of the spine. No acute osseous
pathology.

Review of the MIP images confirms the above findings.
IMPRESSION: 1. No CT evidence of pulmonary embolism.
2. Small bilateral pleural effusions.
3. Mild cardiomegaly and coronary vascular calcification.
